# Patient Record
Sex: Female | Born: 1955
Health system: Southern US, Community
[De-identification: ages and names within clinical notes are randomized; demographics above are authoritative.]

## PROBLEM LIST (undated history)

## (undated) DIAGNOSIS — E079 Disorder of thyroid, unspecified: Secondary | ICD-10-CM

## (undated) DIAGNOSIS — D573 Sickle-cell trait: Secondary | ICD-10-CM

## (undated) DIAGNOSIS — R42 Dizziness and giddiness: Secondary | ICD-10-CM

## (undated) DIAGNOSIS — R519 Headache, unspecified: Secondary | ICD-10-CM

## (undated) DIAGNOSIS — I2510 Atherosclerotic heart disease of native coronary artery without angina pectoris: Secondary | ICD-10-CM

## (undated) DIAGNOSIS — I729 Aneurysm of unspecified site: Secondary | ICD-10-CM

## (undated) DIAGNOSIS — E559 Vitamin D deficiency, unspecified: Secondary | ICD-10-CM

## (undated) DIAGNOSIS — G44229 Chronic tension-type headache, not intractable: Secondary | ICD-10-CM

## (undated) DIAGNOSIS — R51 Headache: Secondary | ICD-10-CM

## (undated) DIAGNOSIS — E785 Hyperlipidemia, unspecified: Secondary | ICD-10-CM

## (undated) HISTORY — DX: Sickle-cell trait: D57.3

## (undated) HISTORY — DX: Aneurysm of unspecified site: I72.9

## (undated) HISTORY — DX: Hyperlipidemia, unspecified: E78.5

## (undated) HISTORY — DX: Disorder of thyroid, unspecified: E07.9

## (undated) HISTORY — DX: Vitamin D deficiency, unspecified: E55.9

## (undated) HISTORY — DX: Atherosclerotic heart disease of native coronary artery without angina pectoris: I25.10

## (undated) HISTORY — DX: Dizziness and giddiness: R42

## (undated) HISTORY — DX: Headache: R51

## (undated) HISTORY — DX: Headache, unspecified: R51.9

## (undated) HISTORY — PX: FOOT SURGERY: SHX648

## (undated) HISTORY — PX: TUBAL LIGATION: SHX77

---
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
## 2012-12-05 ENCOUNTER — Emergency Department (HOSPITAL_BASED_OUTPATIENT_CLINIC_OR_DEPARTMENT_OTHER): Payer: Self-pay

## 2012-12-05 ENCOUNTER — Encounter (HOSPITAL_BASED_OUTPATIENT_CLINIC_OR_DEPARTMENT_OTHER): Payer: Self-pay | Admitting: *Deleted

## 2012-12-05 ENCOUNTER — Emergency Department (HOSPITAL_BASED_OUTPATIENT_CLINIC_OR_DEPARTMENT_OTHER)
Admission: EM | Admit: 2012-12-05 | Discharge: 2012-12-05 | Disposition: A | Discharge status: Home / Self Care | Payer: Self-pay | Attending: Emergency Medicine | Admitting: Emergency Medicine

## 2012-12-05 DIAGNOSIS — R071 Chest pain on breathing: Secondary | ICD-10-CM | POA: Insufficient documentation

## 2012-12-05 DIAGNOSIS — E1169 Type 2 diabetes mellitus with other specified complication: Secondary | ICD-10-CM | POA: Insufficient documentation

## 2012-12-05 DIAGNOSIS — R0789 Other chest pain: Secondary | ICD-10-CM

## 2012-12-05 DIAGNOSIS — Z780 Asymptomatic menopausal state: Secondary | ICD-10-CM | POA: Insufficient documentation

## 2012-12-05 DIAGNOSIS — F17200 Nicotine dependence, unspecified, uncomplicated: Secondary | ICD-10-CM | POA: Insufficient documentation

## 2012-12-05 DIAGNOSIS — R0602 Shortness of breath: Secondary | ICD-10-CM | POA: Insufficient documentation

## 2012-12-05 LAB — CBC WITH DIFFERENTIAL/PLATELET
Eosinophils Absolute: 0.2 10*3/uL (ref 0.0–0.7)
Eosinophils Relative: 2 % (ref 0–5)
Hemoglobin: 12.7 g/dL (ref 12.0–15.0)
Lymphs Abs: 3.7 10*3/uL (ref 0.7–4.0)
MCH: 29.6 pg (ref 26.0–34.0)
MCHC: 35.2 g/dL (ref 30.0–36.0)
MCV: 84.1 fL (ref 78.0–100.0)
Monocytes Relative: 5 % (ref 3–12)
RBC: 4.29 MIL/uL (ref 3.87–5.11)

## 2012-12-05 LAB — BASIC METABOLIC PANEL
BUN: 17 mg/dL (ref 6–23)
CO2: 26 mEq/L (ref 19–32)
Calcium: 9.6 mg/dL (ref 8.4–10.5)
Glucose, Bld: 128 mg/dL — ABNORMAL HIGH (ref 70–99)
Potassium: 3.5 mEq/L (ref 3.5–5.1)
Sodium: 140 mEq/L (ref 135–145)

## 2012-12-05 MED ORDER — ACETAMINOPHEN 500 MG PO TABS
1000.0000 mg | ORAL_TABLET | Freq: Once | ORAL | Status: AC
  Administered 2012-12-05: 1000 mg via ORAL
  Filled 2012-12-05: qty 2

## 2012-12-05 NOTE — ED Notes (Signed)
Patient transported to X-ray 

## 2012-12-05 NOTE — ED Provider Notes (Signed)
History     CSN: [id number]  Arrival date & time 12/05/12  1752   First MD Initiated Contact with Patient 12/05/12 1800      Chief Complaint  Patient presents with  . Chest Pain    (Consider location/radiation/quality/duration/timing/severity/associated sxs/prior treatment) HPI Complains of right-sided anterior chest pain onset yesterday, gradual nonradiating. Worse with changing position or deep inspiration. Accompanying symptoms include shortness of breath, mild no nausea or sweatiness. Pain does not feel like indigestion though she treated herself with ibuprofen and an antacid, without relief. Denies cough denies fever denies other complaint. Pain is constant, mild at present History reviewed. No pertinent past medical history. Cardiac risk factors mother had open heart surgery in her 18s, smoker Past Surgical History  Procedure Laterality Date  . Tubal ligation    . Foot surgery      No family history on file.  History  Substance Use Topics  . Smoking status: Current Every Day Smoker -- 0.50 packs/day    Types: Cigarettes  . Smokeless tobacco: Not on file  . Alcohol Use: Yes    OB History   Grav Para Term Preterm Abortions TAB SAB Ect Mult Living                  Review of Systems  Constitutional: Negative.   HENT: Negative.   Respiratory: Positive for shortness of breath.   Cardiovascular: Positive for chest pain.  Gastrointestinal: Negative.   Genitourinary:       Postmenopausal  Musculoskeletal: Negative.   Skin: Negative.   Neurological: Negative.   Psychiatric/Behavioral: Negative.   All other systems reviewed and are negative.    Allergies  Review of patient's allergies indicates no known allergies.  Home Medications  No current outpatient prescriptions on file.  BP 120/72  Pulse 98  Temp(Src) 97.7 F (36.5 C) (Oral)  Resp 20  Wt 170 lb (77.111 kg)  SpO2 100%  Physical Exam  Nursing note and vitals reviewed. Constitutional: She  appears well-developed and well-nourished.  HENT:  Head: Normocephalic and atraumatic.  Eyes: Conjunctivae are normal. Pupils are equal, round, and reactive to light.  Neck: Neck supple. No tracheal deviation present. No thyromegaly present.  Cardiovascular: Normal rate, regular rhythm and normal heart sounds.  Exam reveals no friction rub.   No murmur heard. Pulmonary/Chest: Effort normal and breath sounds normal.  Chest wall is exquisitely tender anteriorly, right parasternal area. Pain is reproduced by forcible abduction of right shoulder  Abdominal: Soft. Bowel sounds are normal. She exhibits no distension. There is no tenderness.  Musculoskeletal: Normal range of motion. She exhibits no edema and no tenderness.  Neurological: She is alert. Coordination normal.  Skin: Skin is warm and dry. No rash noted.  Psychiatric: She has a normal mood and affect.    Date: 12/05/2012  Rate: 90  Rhythm: normal sinus rhythm  QRS Axis: left  Intervals: normal  ST/T Wave abnormalities: normal  Conduction Disutrbances:none  Narrative Interpretation:   Old EKG Reviewed: none available  ED Course  Procedures (including critical care time)  Labs Reviewed - No data to display No results found.  7:15 PM feels improved after treatment with Tylenol. No diagnosis found. Results for orders placed during the hospital encounter of 12/05/12  TROPONIN I      Result Value Range   Troponin I <0.30  <0.30 ng/mL  BASIC METABOLIC PANEL      Result Value Range   Sodium 140  135 - 145 mEq/L  Potassium 3.5  3.5 - 5.1 mEq/L   Chloride 103  96 - 112 mEq/L   CO2 26  19 - 32 mEq/L   Glucose, Bld 128 (*) 70 - 99 mg/dL   BUN 17  6 - 23 mg/dL   Creatinine, Ser 4.09  0.50 - 1.10 mg/dL   Calcium 9.6  8.4 - 81.1 mg/dL   GFR calc non Af Amer 70 (*) >90 mL/min   GFR calc Af Amer 81 (*) >90 mL/min  CBC WITH DIFFERENTIAL      Result Value Range   WBC 7.8  4.0 - 10.5 K/uL   RBC 4.29  3.87 - 5.11 MIL/uL    Hemoglobin 12.7  12.0 - 15.0 g/dL   HCT 91.4  78.2 - 95.6 %   MCV 84.1  78.0 - 100.0 fL   MCH 29.6  26.0 - 34.0 pg   MCHC 35.2  30.0 - 36.0 g/dL   RDW 21.3  08.6 - 57.8 %   Platelets 170  150 - 400 K/uL   Neutrophils Relative 45  43 - 77 %   Neutro Abs 3.5  1.7 - 7.7 K/uL   Lymphocytes Relative 47 (*) 12 - 46 %   Lymphs Abs 3.7  0.7 - 4.0 K/uL   Monocytes Relative 5  3 - 12 %   Monocytes Absolute 0.4  0.1 - 1.0 K/uL   Eosinophils Relative 2  0 - 5 %   Eosinophils Absolute 0.2  0.0 - 0.7 K/uL   Basophils Relative 0  0 - 1 %   Basophils Absolute 0.0  0.0 - 0.1 K/uL  D-DIMER, QUANTITATIVE      Result Value Range   D-Dimer, Quant 0.39  0.00 - 0.48 ug/mL-FEU   Dg Chest 2 View  12/05/2012  *RADIOLOGY REPORT*  Clinical Data: Chest tightness.  Tobacco use.  CHEST - 2 VIEW  Comparison: None.  Findings: Low lung volumes are present, causing crowding of the pulmonary vasculature.  Cardiac and mediastinal contours appear unremarkable.  Possible calcified AP window lymph node.  Attenuated peripheral pulmonary vasculature is compatible with emphysema/COPD.  The lungs appear otherwise clear.  No pleural effusion noted.  IMPRESSION: 1.  Suspected emphysema.  2.  Low lung volumes.   Original Report Authenticated By: Gaylyn Rong, M.D.      Chest x-ray viewed by me MDM  Assessment strongly doubt acute coronary syndrome patient's history highly atypical. Exam and history suggestive of musculoskeletal pain.  Low pretest probability for pulmonary embolism, negative d-dimer. Discussed patient's chest x-ray with her and counseled patient on smoking cessation for 5 minutes Suggested further check for diabetes by PMD Diagnosis #1 musculoskeletal chest pain #2 hyperglycemia #3 tobacco abuse        Doug Sou, MD 12/05/12 Ernestina [place]

## 2012-12-05 NOTE — ED Notes (Signed)
Chest pain on the right side. Pain started yesterday. Worse with laying down and taking deep breaths.

## 2013-04-30 ENCOUNTER — Encounter (HOSPITAL_BASED_OUTPATIENT_CLINIC_OR_DEPARTMENT_OTHER): Payer: Self-pay

## 2013-04-30 ENCOUNTER — Emergency Department (HOSPITAL_BASED_OUTPATIENT_CLINIC_OR_DEPARTMENT_OTHER)
Admission: EM | Admit: 2013-04-30 | Discharge: 2013-04-30 | Disposition: A | Discharge status: Home / Self Care | Payer: Self-pay | Attending: Emergency Medicine | Admitting: Emergency Medicine

## 2013-04-30 DIAGNOSIS — K0889 Other specified disorders of teeth and supporting structures: Secondary | ICD-10-CM

## 2013-04-30 DIAGNOSIS — K089 Disorder of teeth and supporting structures, unspecified: Secondary | ICD-10-CM | POA: Insufficient documentation

## 2013-04-30 DIAGNOSIS — F17200 Nicotine dependence, unspecified, uncomplicated: Secondary | ICD-10-CM | POA: Insufficient documentation

## 2013-04-30 DIAGNOSIS — Z792 Long term (current) use of antibiotics: Secondary | ICD-10-CM | POA: Insufficient documentation

## 2013-04-30 MED ORDER — OXYCODONE-ACETAMINOPHEN 5-325 MG PO TABS
2.0000 | ORAL_TABLET | Freq: Once | ORAL | Status: AC
  Administered 2013-04-30: 2 via ORAL
  Filled 2013-04-30 (×2): qty 2

## 2013-04-30 MED ORDER — OXYCODONE-ACETAMINOPHEN 5-325 MG PO TABS: 1.0000 | ORAL_TABLET | Freq: Four times a day (QID) | ORAL | Status: DC | PRN

## 2013-04-30 NOTE — ED Notes (Signed)
MD at bedside. 

## 2013-04-30 NOTE — ED Notes (Signed)
Left lower tooth ache x 2 days-was seen at [facility]-was given ibuprofen, amoxil-states no better

## 2013-04-30 NOTE — ED Provider Notes (Signed)
CSN: [id number]     Arrival date & time 04/30/13  2234 History     First MD Initiated Contact with Patient 04/30/13 2331     Chief Complaint  Patient presents with  . Dental Pain   (Consider location/radiation/quality/duration/timing/severity/associated sxs/prior Treatment) HPI Pt presents with c/o dental pain.  She states symptmos began 2 days ago- she has also had some swelling to her left lower jaw.  Pain in left lower tooth.  No fever, no difficulty breathing or swallowing.  Pt was seen at her PMD and started on amoxicillin yesterday also given ibuprofen. She states she is not able to sleep due to the pain.  Pain is worse with chewing and palpation.  There are no other associated systemic symptoms, there are no other alleviating or modifying factors.   History reviewed. No pertinent past medical history. Past Surgical History  Procedure Laterality Date  . Tubal ligation    . Foot surgery     No family history on file. History  Substance Use Topics  . Smoking status: Current Every Day Smoker -- 0.50 packs/day    Types: Cigarettes  . Smokeless tobacco: Not on file  . Alcohol Use: Yes   OB History   Grav Para Term Preterm Abortions TAB SAB Ect Mult Living                 Review of Systems ROS reviewed and all otherwise negative except for mentioned in HPI  Allergies  Review of patient's allergies indicates no known allergies.  Home Medications   Current Outpatient Rx  Name  Route  Sig  Dispense  Refill  . amoxicillin (AMOXIL) 875 MG tablet   Oral   Take 875 mg by mouth 2 (two) times daily.         Marland Kitchen ibuprofen (ADVIL,MOTRIN) 800 MG tablet   Oral   Take 800 mg by mouth every 8 (eight) hours as needed for pain.         Marland Kitchen oxyCODONE-acetaminophen (PERCOCET/ROXICET) 5-325 MG per tablet   Oral   Take 1-2 tablets by mouth every 6 (six) hours as needed for pain.   15 tablet   0    BP 138/83  Pulse 102  Temp(Src) 99 F (37.2 C) (Oral)  Resp 18  Ht 5\' 5"   (1.651 m)  Wt 173 lb (78.472 kg)  BMI 28.79 kg/m2  SpO2 98% Vitals reviewed Physical Exam Physical Examination: General appearance - alert, well appearing, and in no distress Mental status - alert, oriented to person, place, and time Eyes - no conjunctival injection, no scleral icterus Mouth - mucous membranes moist, pharynx normal without lesions, poor dentition, one remaining left lower premolar with significant decay, likely periapical abscess, no swelling under the tongue Neck - supple, no significant adenopathy Chest - clear to auscultation, no wheezes, rales or rhonchi, symmetric air entry Heart - normal rate, regular rhythm, normal S1, S2, no murmurs, rubs, clicks or gallops Skin - normal coloration and turgor, no rashes  ED Course   Procedures (including critical care time)  Labs Reviewed - No data to display No results found. 1. Pain, dental     MDM  Pt presenting with c/o dental pain.  She is currently on amoxicillin x 1 day and ibuprofen is not helpign with pain.  Pt encouraged to arrange for dental appointment.  Given rx for pain medicaion and to continue on amoxicillin.  Discharged with strict return precautions.  Pt agreeable with plan.  Ethelda Chick,  MD 05/01/13 7829

## 2014-04-11 LAB — HM PAP SMEAR: HM Pap smear: NORMAL

## 2014-04-11 LAB — HM MAMMOGRAPHY: HM MAMMO: NORMAL

## 2014-08-21 ENCOUNTER — Encounter (HOSPITAL_BASED_OUTPATIENT_CLINIC_OR_DEPARTMENT_OTHER): Payer: Self-pay

## 2014-08-21 ENCOUNTER — Emergency Department (HOSPITAL_BASED_OUTPATIENT_CLINIC_OR_DEPARTMENT_OTHER): Payer: PRIVATE HEALTH INSURANCE

## 2014-08-21 ENCOUNTER — Emergency Department (HOSPITAL_BASED_OUTPATIENT_CLINIC_OR_DEPARTMENT_OTHER)
Admission: EM | Admit: 2014-08-21 | Discharge: 2014-08-21 | Disposition: A | Discharge status: Home / Self Care | Payer: PRIVATE HEALTH INSURANCE | Attending: Emergency Medicine | Admitting: Emergency Medicine

## 2014-08-21 DIAGNOSIS — R519 Headache, unspecified: Secondary | ICD-10-CM

## 2014-08-21 DIAGNOSIS — Z720 Tobacco use: Secondary | ICD-10-CM | POA: Insufficient documentation

## 2014-08-21 DIAGNOSIS — I951 Orthostatic hypotension: Secondary | ICD-10-CM | POA: Diagnosis not present

## 2014-08-21 DIAGNOSIS — R42 Dizziness and giddiness: Secondary | ICD-10-CM | POA: Insufficient documentation

## 2014-08-21 DIAGNOSIS — R2241 Localized swelling, mass and lump, right lower limb: Secondary | ICD-10-CM | POA: Diagnosis not present

## 2014-08-21 DIAGNOSIS — R51 Headache: Secondary | ICD-10-CM | POA: Diagnosis present

## 2014-08-21 DIAGNOSIS — M7989 Other specified soft tissue disorders: Secondary | ICD-10-CM

## 2014-08-21 LAB — CBC WITH DIFFERENTIAL/PLATELET
Basophils Absolute: 0 10*3/uL (ref 0.0–0.1)
Basophils Relative: 0 % (ref 0–1)
Eosinophils Absolute: 0.1 10*3/uL (ref 0.0–0.7)
Eosinophils Relative: 1 % (ref 0–5)
HCT: 39.4 % (ref 36.0–46.0)
HEMOGLOBIN: 13.6 g/dL (ref 12.0–15.0)
LYMPHS ABS: 2.9 10*3/uL (ref 0.7–4.0)
LYMPHS PCT: 28 % (ref 12–46)
MCH: 29.4 pg (ref 26.0–34.0)
MCHC: 34.5 g/dL (ref 30.0–36.0)
MCV: 85.1 fL (ref 78.0–100.0)
MONOS PCT: 5 % (ref 3–12)
Monocytes Absolute: 0.6 10*3/uL (ref 0.1–1.0)
NEUTROS ABS: 7 10*3/uL (ref 1.7–7.7)
NEUTROS PCT: 66 % (ref 43–77)
PLATELETS: 210 10*3/uL (ref 150–400)
RBC: 4.63 MIL/uL (ref 3.87–5.11)
RDW: 13.5 % (ref 11.5–15.5)
WBC: 10.6 10*3/uL — AB (ref 4.0–10.5)

## 2014-08-21 LAB — URINALYSIS, ROUTINE W REFLEX MICROSCOPIC
BILIRUBIN URINE: NEGATIVE
GLUCOSE, UA: NEGATIVE mg/dL
Ketones, ur: NEGATIVE mg/dL
Nitrite: NEGATIVE
PROTEIN: NEGATIVE mg/dL
SPECIFIC GRAVITY, URINE: 1.009 (ref 1.005–1.030)
UROBILINOGEN UA: 1 mg/dL (ref 0.0–1.0)
pH: 7 (ref 5.0–8.0)

## 2014-08-21 LAB — COMPREHENSIVE METABOLIC PANEL
ALK PHOS: 163 U/L — AB (ref 39–117)
ALT: 14 U/L (ref 0–35)
ANION GAP: 15 (ref 5–15)
AST: 14 U/L (ref 0–37)
Albumin: 3.9 g/dL (ref 3.5–5.2)
BILIRUBIN TOTAL: 0.2 mg/dL — AB (ref 0.3–1.2)
BUN: 11 mg/dL (ref 6–23)
CHLORIDE: 102 meq/L (ref 96–112)
CO2: 26 mEq/L (ref 19–32)
Calcium: 9.7 mg/dL (ref 8.4–10.5)
Creatinine, Ser: 0.8 mg/dL (ref 0.50–1.10)
GFR calc non Af Amer: 80 mL/min — ABNORMAL LOW (ref >90)
GLUCOSE: 99 mg/dL (ref 70–99)
POTASSIUM: 4 meq/L (ref 3.7–5.3)
SODIUM: 143 meq/L (ref 137–147)
TOTAL PROTEIN: 7.9 g/dL (ref 6.0–8.3)

## 2014-08-21 LAB — URINE MICROSCOPIC-ADD ON

## 2014-08-21 LAB — PRO B NATRIURETIC PEPTIDE: PRO B NATRI PEPTIDE: 7.9 pg/mL (ref 0–125)

## 2014-08-21 LAB — TROPONIN I: Troponin I: <0.3 ng/mL (ref <0.30)

## 2014-08-21 MED ORDER — DIPHENHYDRAMINE HCL 50 MG/ML IJ SOLN
25.0000 mg | Freq: Once | INTRAMUSCULAR | Status: AC
  Administered 2014-08-21: 25 mg via INTRAVENOUS
  Filled 2014-08-21: qty 1

## 2014-08-21 MED ORDER — METOCLOPRAMIDE HCL 5 MG/ML IJ SOLN
10.0000 mg | Freq: Once | INTRAMUSCULAR | Status: AC
  Administered 2014-08-21: 10 mg via INTRAVENOUS
  Filled 2014-08-21: qty 2

## 2014-08-21 MED ORDER — KETOROLAC TROMETHAMINE 30 MG/ML IJ SOLN
30.0000 mg | Freq: Once | INTRAMUSCULAR | Status: AC
  Administered 2014-08-21: 30 mg via INTRAVENOUS
  Filled 2014-08-21: qty 1

## 2014-08-21 MED ORDER — SODIUM CHLORIDE 0.9 % IV BOLUS (SEPSIS)
1000.0000 mL | Freq: Once | INTRAVENOUS | Status: AC
  Administered 2014-08-21: 1000 mL via INTRAVENOUS

## 2014-08-21 NOTE — ED Notes (Signed)
MD at bedside. 

## 2014-08-21 NOTE — Discharge Instructions (Signed)
General Headache Without Cause Follow up with your doctor. Return to the ED if you develop new or worsening symptoms. A headache is pain or discomfort felt around the head or neck area. The specific cause of a headache may not be found. There are many causes and types of headaches. A few common ones are:  Tension headaches.  Migraine headaches.  Cluster headaches.  Chronic daily headaches. HOME CARE INSTRUCTIONS   Keep all follow-up appointments with your caregiver or any specialist referral.  Only take over-the-counter or prescription medicines for pain or discomfort as directed by your caregiver.  Lie down in a dark, quiet room when you have a headache.  Keep a headache journal to find out what may trigger your migraine headaches. For example, write down:  What you eat and drink.  How much sleep you get.  Any change to your diet or medicines.  Try massage or other relaxation techniques.  Put ice packs or heat on the head and neck. Use these 3 to 4 times per day for 15 to 20 minutes each time, or as needed.  Limit stress.  Sit up straight, and do not tense your muscles.  Quit smoking if you smoke.  Limit alcohol use.  Decrease the amount of caffeine you drink, or stop drinking caffeine.  Eat and sleep on a regular schedule.  Get 7 to 9 hours of sleep, or as recommended by your caregiver.  Keep lights dim if bright lights bother you and make your headaches worse. SEEK MEDICAL CARE IF:   You have problems with the medicines you were prescribed.  Your medicines are not working.  You have a change from the usual headache.  You have nausea or vomiting. SEEK IMMEDIATE MEDICAL CARE IF:   Your headache becomes severe.  You have a fever.  You have a stiff neck.  You have loss of vision.  You have muscular weakness or loss of muscle control.  You start losing your balance or have trouble walking.  You feel faint or pass out.  You have severe symptoms that  are different from your first symptoms. MAKE SURE YOU:   Understand these instructions.  Will watch your condition.  Will get help right away if you are not doing well or get worse. Document Released: 08/28/2005 Document Revised: 11/20/2011 Document Reviewed: 09/13/2011 [facility] Patient Information 2015 [place], [place]. This information is not intended to replace advice given to you by your health care provider. Make sure you discuss any questions you have with your health care provider.

## 2014-08-21 NOTE — ED Notes (Signed)
Pt able to keep fluids down.  

## 2014-08-21 NOTE — ED Provider Notes (Signed)
CSN: [id number]     Arrival date & time 08/21/14  1811 History  This chart was scribed for Ezequiel Essex, MD by Randa Evens, ED Scribe. This patient was seen in room MH11/MH11 and the patient's care was started at 6:24 PM.    Chief Complaint  Patient presents with  . Headache   Patient is a 58 y.o. female presenting with headaches. The history is provided by the patient. No language interpreter was used.  Headache Associated symptoms: dizziness   Associated symptoms: no abdominal pain    HPI Comments: Jaime Harris is a 58 y.o. female who presents to the Emergency Department complaining of gradual headache onset today. She states that this headache feels similar to others but is slightly worse today.  She states she has associated dizziness. She states she is having complication with her vision but was unable to specify and it is now back to normal. She is complain of leg swelling as well for about 2 weeks. She states that right leg is worse than the left. She states she has taken ibuprofen for the headache with no relief. Denies CP, SOB, abdominal pain or nausea. Pt denies Hx of cardiac problems or DVT's. Pt states she has had a CT scan and MRI previously for her headaches and dizziness but states that the readings showed no abnormalities.    History reviewed. No pertinent past medical history. Past Surgical History  Procedure Laterality Date  . Tubal ligation    . Foot surgery     No family history on file. History  Substance Use Topics  . Smoking status: Current Every Day Smoker -- 0.50 packs/day    Types: Cigarettes  . Smokeless tobacco: Not on file  . Alcohol Use: Yes   OB History    No data available     Review of Systems  Respiratory: Negative for shortness of breath.   Cardiovascular: Positive for leg swelling. Negative for chest pain.  Gastrointestinal: Negative for abdominal pain.  Neurological: Positive for dizziness and headaches.   A complete 10 system  review of systems was obtained and all systems are negative except as noted in the HPI and PMH.     Allergies  Review of patient's allergies indicates no known allergies.  Home Medications   Prior to Admission medications   Not on File   Triage Vitals: BP 135/82 mmHg  Pulse 108  Temp(Src) 98.7 F (37.1 C) (Oral)  Resp 18  Ht 5\' 5"  (1.651 m)  Wt 180 lb (81.647 kg)  BMI 29.95 kg/m2  SpO2 99%    Physical Exam  Constitutional: She is oriented to person, place, and time. She appears well-developed and well-nourished. No distress.  HENT:  Head: Normocephalic and atraumatic.  Mouth/Throat: Oropharynx is clear and moist. No oropharyngeal exudate.  No temporal artery tenderness  Eyes: Conjunctivae and EOM are normal. Pupils are equal, round, and reactive to light.  Neck: Normal range of motion. Neck supple.  No meningismus.  Cardiovascular: Normal rate, regular rhythm, normal heart sounds and intact distal pulses.   No murmur heard. Pulmonary/Chest: Effort normal and breath sounds normal. No respiratory distress.  Abdominal: Soft. There is no tenderness. There is no rebound and no guarding.  Musculoskeletal: Normal range of motion. She exhibits edema. She exhibits no tenderness.  Trace pedal edema bilaterally, no calf tenderness.   Neurological: She is alert and oriented to person, place, and time. No cranial nerve deficit. She exhibits normal muscle tone. Coordination normal.  No ataxia on  finger to nose bilaterally. No pronator drift. 5/5 strength throughout. CN 2-12 intact. Negative Romberg. Equal grip strength. Sensation intact. Gait is normal.  Test of skew negative. Head impulse testing negative.  Skin: Skin is warm.  Psychiatric: She has a normal mood and affect. Her behavior is normal.  Nursing note and vitals reviewed.   ED Course  Procedures (including critical care time) DIAGNOSTIC STUDIES: Oxygen Saturation is 99% on RA, normal by my interpretation.     COORDINATION OF CARE: 6:34 PM-Discussed treatment plan with pt at bedside and pt agreed to plan.     Labs Review Labs Reviewed  CBC WITH DIFFERENTIAL - Abnormal; Notable for the following:    WBC 10.6 (*)    All other components within normal limits  COMPREHENSIVE METABOLIC PANEL - Abnormal; Notable for the following:    Alkaline Phosphatase 163 (*)    Total Bilirubin 0.2 (*)    GFR calc non Af Amer 80 (*)    All other components within normal limits  URINALYSIS, ROUTINE W REFLEX MICROSCOPIC - Abnormal; Notable for the following:    APPearance CLOUDY (*)    Hgb urine dipstick SMALL (*)    Leukocytes, UA TRACE (*)    All other components within normal limits  URINE MICROSCOPIC-ADD ON - Abnormal; Notable for the following:    Squamous Epithelial / LPF MANY (*)    Bacteria, UA MANY (*)    All other components within normal limits  TROPONIN I  PRO B NATRIURETIC PEPTIDE    Imaging Review Ct Head Wo Contrast  08/21/2014   CLINICAL DATA:  Headaches. Dizziness. Blurred vision. Leg swelling.  EXAM: CT HEAD WITHOUT CONTRAST  TECHNIQUE: Contiguous axial images were obtained from the base of the skull through the vertex without intravenous contrast.  COMPARISON:  None.  FINDINGS: The brainstem, cerebellum, cerebral peduncles, thalamus, basal ganglia, basilar cisterns, and ventricular system appear within normal limits. No intracranial hemorrhage, mass lesion, or acute CVA. Visualized paranasal sinuses appear clear.  IMPRESSION: No significant abnormality identified.   Electronically Signed   By: Sherryl Barters M.D.   On: 08/21/2014 18:54   US Venous Img Lower Bilateral  08/21/2014   CLINICAL DATA:  Bilateral lower extremity swelling chronically over the lower legs worse over the past 2 weeks. No pain.  EXAM: BILATERAL LOWER EXTREMITY VENOUS DOPPLER ULTRASOUND  TECHNIQUE: Gray-scale sonography with graded compression, as well as color Doppler and duplex ultrasound were performed to  evaluate the lower extremity deep venous systems from the level of the common femoral vein and including the common femoral, femoral, profunda femoral, popliteal and calf veins including the posterior tibial, peroneal and gastrocnemius veins when visible. The superficial great saphenous vein was also interrogated. Spectral Doppler was utilized to evaluate flow at rest and with distal augmentation maneuvers in the common femoral, femoral and popliteal veins.  COMPARISON:  None.  FINDINGS: RIGHT LOWER EXTREMITY  Common Femoral Vein: No evidence of thrombus. Normal compressibility, respiratory phasicity and response to augmentation.  Saphenofemoral Junction: No evidence of thrombus. Normal compressibility and flow on color Doppler imaging.  Profunda Femoral Vein: No evidence of thrombus. Normal compressibility and flow on color Doppler imaging.  Femoral Vein: No evidence of thrombus. Normal compressibility, respiratory phasicity and response to augmentation.  Popliteal Vein: No evidence of thrombus. Normal compressibility, respiratory phasicity and response to augmentation.  Calf Veins: No evidence of thrombus. Normal compressibility and flow on color Doppler imaging.  Superficial Great Saphenous Vein: No evidence of thrombus. Normal  compressibility and flow on color Doppler imaging.  Venous Reflux:  None.  Other Findings:  None.  LEFT LOWER EXTREMITY  Common Femoral Vein: No evidence of thrombus. Normal compressibility, respiratory phasicity and response to augmentation.  Saphenofemoral Junction: No evidence of thrombus. Normal compressibility and flow on color Doppler imaging.  Profunda Femoral Vein: No evidence of thrombus. Normal compressibility and flow on color Doppler imaging.  Femoral Vein: No evidence of thrombus. Normal compressibility, respiratory phasicity and response to augmentation.  Popliteal Vein: No evidence of thrombus. Normal compressibility, respiratory phasicity and response to augmentation.  Calf  Veins: No evidence of thrombus. Normal compressibility and flow on color Doppler imaging.  Superficial Great Saphenous Vein: No evidence of thrombus. Normal compressibility and flow on color Doppler imaging.  Venous Reflux:  None.  Other Findings: 1.7 cm minimally complicated cystic structure over the left popliteal fossa likely Baker cyst.  IMPRESSION: No evidence of deep venous thrombosis.  1.7 cm minimally complicated cyst over the left popliteal fossa likely a Baker's cyst.   Electronically Signed   By: Marin Olp M.D.   On: 08/21/2014 19:44     EKG Interpretation   Date/Time:  Friday August 21 2014 18:43:00 EST Ventricular Rate:  98 PR Interval:  158 QRS Duration: 90 QT Interval:  342 QTC Calculation: 436 R Axis:   -29 Text Interpretation:  Normal sinus rhythm Minimal voltage criteria for  LVH, may be normal variant Borderline ECG No significant change was found  Confirmed by [place] [phone]) on 08/21/2014 6:48:43 PM      MDM   Final diagnoses:  Headache  Leg swelling   Patient presents with gradual onset headache associated with lightheadedness similar to previous. States she's had multiple CTs and MRIs without diagnosis. These results are not available. Her neurological exam is nonfocal.  CT head negative. Headache improved with migraine cocktail of Toradol, Reglan and Benadryl. Orthostatics positive by HR.  Elevates to 105 with standing. UA appears contaminated.  Dopplers negative for DVTs. Patient is ambulatory and tolerating by mouth.  Symptoms improved with PO and IV fluids.  nonfocal neuro exam.  Headache resolved.  Appears stable for outpatient followup. Doubt acute CVA.  BP 102/51 mmHg  Pulse 92  Temp(Src) 98.7 F (37.1 C) (Oral)  Resp 18  Ht 5\' 5"  (1.651 m)  Wt 180 lb (81.647 kg)  BMI 29.95 kg/m2  SpO2 98%    I personally performed the services described in this documentation, which was scribed in my presence. The recorded information  has been reviewed and is accurate.    Ezequiel Essex, MD 08/22/14 [phone]

## 2014-08-21 NOTE — ED Notes (Signed)
Ambulatory without diff

## 2014-08-21 NOTE — ED Notes (Addendum)
C/o HA, dizziness "for a year"-swelling to bilat LEs x "couple weeks"-states she came to ED today b/c left LE "is cold to touch"-steady gait to tx area

## 2014-09-11 HISTORY — PX: COLONOSCOPY: SHX174

## 2014-10-30 ENCOUNTER — Ambulatory Visit: Payer: PRIVATE HEALTH INSURANCE | Admitting: Family

## 2014-11-13 ENCOUNTER — Telehealth: Payer: Self-pay | Admitting: *Deleted

## 2014-11-13 ENCOUNTER — Encounter: Payer: Self-pay | Admitting: *Deleted

## 2014-11-13 NOTE — Telephone Encounter (Signed)
Pre-Visit Call completed with patient and chart updated.   Pre-Visit Info documented in Specialty Comments under [employer].    

## 2014-11-16 ENCOUNTER — Encounter: Payer: Self-pay | Admitting: Family

## 2014-11-16 ENCOUNTER — Ambulatory Visit (INDEPENDENT_AMBULATORY_CARE_PROVIDER_SITE_OTHER): Payer: PRIVATE HEALTH INSURANCE | Admitting: Family

## 2014-11-16 VITALS — BP 126/80 | HR 107 | Temp 98.0°F | Resp 18 | Ht 64.5 in | Wt 175.0 lb

## 2014-11-16 DIAGNOSIS — E049 Nontoxic goiter, unspecified: Secondary | ICD-10-CM

## 2014-11-16 DIAGNOSIS — H8110 Benign paroxysmal vertigo, unspecified ear: Secondary | ICD-10-CM | POA: Insufficient documentation

## 2014-11-16 DIAGNOSIS — G44209 Tension-type headache, unspecified, not intractable: Secondary | ICD-10-CM

## 2014-11-16 DIAGNOSIS — E010 Iodine-deficiency related diffuse (endemic) goiter: Secondary | ICD-10-CM | POA: Insufficient documentation

## 2014-11-16 DIAGNOSIS — K589 Irritable bowel syndrome without diarrhea: Secondary | ICD-10-CM

## 2014-11-16 DIAGNOSIS — E039 Hypothyroidism, unspecified: Secondary | ICD-10-CM

## 2014-11-16 LAB — TSH: TSH: 0.44 u[IU]/mL (ref 0.35–4.50)

## 2014-11-16 NOTE — Assessment & Plan Note (Signed)
Obtain tsh and thyroid ultrasound.

## 2014-11-16 NOTE — Patient Instructions (Signed)
Please complete lab work prior to leaving. You will be contacted about your thyroid ultrasound. Please schedule a complete physical at the front desk.

## 2014-11-16 NOTE — Assessment & Plan Note (Signed)
Orthostatics are negative.  Will refer for vestibular rehab.

## 2014-11-16 NOTE — Progress Notes (Signed)
Subjective:    Patient ID: Jaime Harris, female    DOB: 11-19-1955, 59 y.o.   MRN: [id number]  HPI  Jaime Harris is a 59 yr old female who presents today to establish care.    She has chief complaint of headache and dizziness. She was followed at [facility].  Symptoms started >1 yr ago and are worsened.  Reports that she had MRI an CT scan. Has seen an "ear specialist" at [place]. Reports that she was given meclizine which does not help.  Headache- report that headache is intermittent- about 3 times a week.  Last 1 hour.  Usually lasts 1 hour, sometimes improved by ibuprofen.  She reports that she had nausea this AM, but typically no nausea with HA's. Denies photophobia or phonophobia. When she has her HA it is behind her eyes. She denies hx of allergies. She had eye exam last night and was told her prescription was better.  Dizziness, worse with standing.  She sits at work.  When she stands up she needs to stop before she continues to move.  Denies numbness/weakness, denies visual changes.    She reports that she has hx of enlarged thyroid. He reports that in the past she has had thyroid US's but it has been a long time. Thinks that her TSH has been stable but not sure.   Review of Systems  Constitutional: Negative for unexpected weight change.  HENT: Negative for hearing loss.        Mild drainage- resolving  Eyes: Negative for visual disturbance.  Respiratory: Negative for cough.   Cardiovascular: Negative for leg swelling.  Gastrointestinal: Negative for nausea and diarrhea.       IBS- used amitiza, which helped constipation but too expensive  Genitourinary: Negative for dysuria and frequency.  Musculoskeletal: Negative for myalgias and arthralgias.  Skin: Negative for rash.  Neurological: Positive for dizziness and headaches.  Hematological: Negative for adenopathy.  Psychiatric/Behavioral: Negative for dysphoric mood and agitation.       Past Medical History    Diagnosis Date  . Vertigo   . Thyroid disease     enlarged thyroid    History   Social History  . Marital Status: Single    Spouse Name: N/A  . Number of Children: N/A  . Years of Education: N/A   Occupational History  . Not on file.   Social History Main Topics  . Smoking status: Current Every Day Smoker -- 0.50 packs/day    Types: Cigarettes  . Smokeless tobacco: Not on file  . Alcohol Use: 0.0 oz/week    0 Standard drinks or equivalent per week     Comment: occasional  . Drug Use: No  . Sexual Activity: Not on file   Other Topics Concern  . Not on file   Social History Narrative   Makes eye glasses.     Single,   GED   She has 3 children   63- Son Jaime Harris- lives locally- he has one son   71- Daughter Jaime Harris- 7 children (4 of these children live with the pt- she is a primary care giver)   Jaime Harris- lives in [place] 1 daughter   Enjoys reading    Past Surgical History  Procedure Laterality Date  . Tubal ligation    . Foot surgery Bilateral     Pt states she had bones removed from the sides of her feet and middle toes so she could walk better.  . Tonsillectomy  1975    Family History  Problem Relation Age of Onset  . Heart disease Mother     pacemaker  . Diabetes Mother   . Heart disease Paternal Grandmother     died of CHF  . Diabetes Paternal Grandmother   . Cancer Neg Hx   . Kidney disease Neg Hx     No Known Allergies  No current outpatient prescriptions on file prior to visit.   No current facility-administered medications on file prior to visit.    BP 126/80 mmHg  Pulse 107  Temp(Src) 98 F (36.7 C) (Oral)  Resp 18  Ht 5' 4.5" (1.638 m)  Wt 175 lb (79.379 kg)  BMI 29.59 kg/m2  SpO2 99%    Objective:   Physical Exam  Constitutional: She is oriented to person, place, and time. She appears well-developed and well-nourished.  HENT:  Head: Normocephalic and atraumatic.  Right Ear: Tympanic membrane and ear canal normal.   Left Ear: Tympanic membrane and ear canal normal.  Mouth/Throat: No oropharyngeal exudate or posterior oropharyngeal edema.  Eyes: Pupils are equal, round, and reactive to light.  Neck: Thyromegaly present.  Cardiovascular: Normal rate, regular rhythm and normal heart sounds.   No murmur heard. Pulmonary/Chest: Effort normal and breath sounds normal. No respiratory distress. She has no wheezes.  Musculoskeletal: She exhibits no edema.  Neurological: She is alert and oriented to person, place, and time. Coordination normal.  Skin: Skin is warm and dry.  Psychiatric: She has a normal mood and affect. Her behavior is normal. Judgment and thought content normal.          Assessment & Plan:

## 2014-11-16 NOTE — Progress Notes (Signed)
Pre visit review using our clinic review tool, if applicable. No additional management support is needed unless otherwise documented below in the visit note. 

## 2014-11-16 NOTE — Assessment & Plan Note (Signed)
We discussed referral to neurology/HA specialist, but she declines at this time and will let me know if her symptoms worsen.

## 2014-11-17 ENCOUNTER — Telehealth: Payer: Self-pay | Admitting: Family

## 2014-11-17 NOTE — Telephone Encounter (Signed)
emmi emailed °

## 2014-11-18 ENCOUNTER — Ambulatory Visit (HOSPITAL_BASED_OUTPATIENT_CLINIC_OR_DEPARTMENT_OTHER): Payer: PRIVATE HEALTH INSURANCE

## 2014-11-19 ENCOUNTER — Ambulatory Visit (HOSPITAL_BASED_OUTPATIENT_CLINIC_OR_DEPARTMENT_OTHER): Payer: PRIVATE HEALTH INSURANCE

## 2014-11-22 ENCOUNTER — Ambulatory Visit (HOSPITAL_BASED_OUTPATIENT_CLINIC_OR_DEPARTMENT_OTHER)
Admission: RE | Admit: 2014-11-22 | Discharge: 2014-11-22 | Disposition: A | Discharge status: Home / Self Care | Payer: PRIVATE HEALTH INSURANCE | Source: Ambulatory Visit | Attending: Family | Admitting: Family

## 2014-11-22 DIAGNOSIS — E049 Nontoxic goiter, unspecified: Secondary | ICD-10-CM | POA: Insufficient documentation

## 2014-11-22 DIAGNOSIS — R42 Dizziness and giddiness: Secondary | ICD-10-CM | POA: Insufficient documentation

## 2014-11-24 ENCOUNTER — Ambulatory Visit: Payer: PRIVATE HEALTH INSURANCE | Attending: Family | Admitting: Physical Therapy

## 2014-11-24 DIAGNOSIS — R42 Dizziness and giddiness: Secondary | ICD-10-CM | POA: Insufficient documentation

## 2014-11-24 DIAGNOSIS — H8111 Benign paroxysmal vertigo, right ear: Secondary | ICD-10-CM | POA: Insufficient documentation

## 2014-11-24 NOTE — Therapy (Signed)
[facility] [address]  [place], [place], [address] Phone: [phone]   Fax:  [phone]  Physical Therapy Evaluation  Patient Details  Name: Jaime Harris MRN: [id number] Date of Birth: August 12, 1956 Referring Provider:  Debbrah Alar, NP  Encounter Date: 11/24/2014      PT End of Session - 11/24/14 1228    Visit Number 1   Number of Visits 6   Date for PT Re-Evaluation 01/05/15   PT Start Time 1105   PT Stop Time 1140   PT Time Calculation (min) 35 min   Activity Tolerance Patient tolerated treatment well   Behavior During Therapy [facility] for tasks assessed/performed      Past Medical History  Diagnosis Date  . Vertigo   . Thyroid disease     enlarged thyroid  . Sickle cell trait     Past Surgical History  Procedure Laterality Date  . Tubal ligation    . Foot surgery Bilateral     Pt states she had bones removed from the sides of her feet and middle toes so she could walk better.  . Tonsillectomy  1975    There were no vitals filed for this visit.  Visit Diagnosis:  Benign paroxysmal positional vertigo, right - Plan: PT plan of care cert/re-cert  Dizziness and giddiness - Plan: PT plan of care cert/re-cert      Subjective Assessment - 11/24/14 1107    Symptoms Pt is a 59 y/o female who presents to OPPT for constant vertigo.  Pt report symptoms have progressed from "a few times a year" to constant.  Symptoms provoked with sitting to standing and looking up. When symptoms exacerbated reports lasting about 30 seconds.   Pertinent History sickle cell   Limitations Walking   Patient Stated Goals to eliminate dizziness   Currently in Pain? No/denies            [facility] PT Assessment - 11/24/14 1109    Assessment   Medical Diagnosis vertigo   Onset Date --  2 years ago; exacerbated over past 6 months   Next MD Visit April 2016   Prior Therapy none   Precautions   Precautions None   Restrictions   Weight Bearing Restrictions No   Balance Screen   Has the patient fallen in the past 6 months No   Has the patient had a decrease in activity level because of a fear of falling?  No   Is the patient reluctant to leave their home because of a fear of falling?  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   Leisure grandchildren 32-16 y/o, bowling, fishing   Cognition   Overall Cognitive Status Within Functional Limits for tasks assessed            Vestibular Assessment - 11/24/14 1114    Symptom Behavior   Type of Dizziness Imbalance  blurry vision; "on the ocean"   Frequency of Dizziness  constant with exacerbation provoked by standing   Duration of Dizziness constant; increase in symptoms lasting about 30 sec   Aggravating Factors Sit to stand;Looking up to the ceiling   Relieving Factors Rest;Head stationary   Occulomotor Exam   Occulomotor Alignment Normal   Smooth Pursuits Intact   Saccades Intact   Comment head thrust: bil negative; increase in symptoms on L   Vestibulo-Occular Reflex   VOR 1 Head Only (x 1 viewing) WNL   Positional Testing   Dix-Hallpike Dix-Hallpike Right;Dix-Hallpike Left   Horizontal Canal Testing --   Dix-Hallpike Right   Dix-Hallpike Right Duration > 1 min   Dix-Hallpike Right Symptoms Upbeat, right rotatory nystagmus   Dix-Hallpike Left   Dix-Hallpike Left Duration on going   Dix-Hallpike Left Symptoms No  nystagmus;Other (comment)  c/o blurry vision and increased ear pressure                Vestibular Treatment/Exercise - 11/24/14 1227    Vestibular Treatment/Exercise   Vestibular Treatment Provided Canalith Repositioning   Canalith Repositioning Epley Manuever Right    EPLEY MANUEVER RIGHT   Number of Reps  1   Overall Response Improved Symptoms   Response Details  performed head shaking prior to epley's maneuver due to concern for cupulolithiasis; pt asymptomatic following repositioning               PT Education - 11/24/14 1228    Education provided Yes   Education Details BPPV   Person(s) Educated Patient   Methods Explanation;Handout   Comprehension Verbalized understanding             PT Long Term Goals - 11/24/14 1232    PT LONG TERM GOAL #1   Title independent with HEP (01/05/15)   Time 6   Period Weeks   Status New   PT LONG TERM GOAL #2   Title demonstrate negative positional testing (01/05/15)   Time 6   Period Weeks   Status New   PT LONG TERM GOAL #3   Title report 75% improvement in symptoms (01/05/15)   Time 6   Period Weeks   Status New               Plan - 11/24/14 1229    Clinical Impression Statement Pt presents to OPPT with 2 year history of vertigo.  Testing indicated possible R post canal cupulolithiasis treated with 1 rep of epleys today with complete resolve in symptoms.  Will plan to follow up and address as needed.  Pt may have underlying vestibular hypofunction as well due to duration of symptoms.   Pt will benefit from skilled therapeutic intervention in order to improve on the following deficits Decreased balance;Other (comment)  dizziness; vertigo   Rehab Potential Good   PT Frequency 1x / week   PT Duration 6 weeks   PT Treatment/Interventions ADLs/Self Care Home Management;Neuromuscular re-education;Other (comment);Functional mobility training;Patient/family education;Therapeutic activities;Balance  training;Therapeutic exercise  canalith repositioning   PT Next Visit Plan reassess; check horizontal canal   Consulted and Agree with Plan of Care Patient         Problem List Patient Active Problem List   Diagnosis Date Noted  . Thyromegaly 11/16/2014  . BPV (benign positional vertigo) 11/16/2014  . Tension headache 11/16/2014  . IBS (irritable bowel syndrome) 11/16/2014   Laureen Abrahams, PT, DPT 11/24/2014 12:38 PM  [facility] [address]  [place], [place], [address] Phone:  [phone]   Fax:  [phone]

## 2014-11-30 ENCOUNTER — Ambulatory Visit: Payer: PRIVATE HEALTH INSURANCE | Admitting: Physical Therapy

## 2014-11-30 DIAGNOSIS — H8111 Benign paroxysmal vertigo, right ear: Secondary | ICD-10-CM

## 2014-11-30 DIAGNOSIS — R42 Dizziness and giddiness: Secondary | ICD-10-CM

## 2014-11-30 NOTE — Patient Instructions (Signed)
Gaze Stabilization: Sitting   Keeping eyes on target on wall 3-5 feet away, move head side to side for __30__ seconds. Repeat while moving head up and down for _30___ seconds. Do __2-3__ sessions per day. Repeat using target on pattern background.  Copyright  VHI. All rights reserved.    Special Instructions: Exercises may bring on mild to moderate symptoms of dizziness that resolve within 30 minutes of completing exercises. If symptoms are lasting longer than 30 minutes, modify your exercises by:  >decreasing the # of times you complete each activity >ensuring your symptoms return to baseline before moving onto the next exercise >dividing up exercises so you do not do them all in one session, but multiple short sessions throughout the day >doing them once a day until symptoms improve   Laureen Abrahams, PT, DPT 11/30/2014 11:28 AM  [facility] [place], [place] [address]  [phone] (office) [phone] (fax)

## 2014-11-30 NOTE — Therapy (Signed)
[facility] [address]  [place], [place], [address] Phone: [phone]   Fax:  [phone]  Physical Therapy Treatment  Patient Details  Name: Jaime Harris MRN: [id number] Date of Birth: November 12, 1955 Referring Provider:  Debbrah Alar, NP  Encounter Date: 11/30/2014      PT End of Session - 11/30/14 1147    Visit Number 2   Number of Visits 6   Date for PT Re-Evaluation 01/05/15   PT Start Time 1100   PT Stop Time 1139   PT Time Calculation (min) 39 min   Activity Tolerance Patient tolerated treatment well   Behavior During Therapy [facility] for tasks assessed/performed      Past Medical History  Diagnosis Date  . Vertigo   . Thyroid disease     enlarged thyroid  . Sickle cell trait     Past Surgical History  Procedure Laterality Date  . Tubal ligation    . Foot surgery Bilateral     Pt states she had bones removed from the sides of her feet and middle toes so she could walk better.  . Tonsillectomy  1975    There were no vitals filed for this visit.  Visit Diagnosis:  Benign paroxysmal positional vertigo, right  Dizziness and giddiness      Subjective Assessment - 11/30/14 1102    Symptoms Dizziness resolved x 2 days then returned.  Had episode getting onto elevator "like I was swaying" and subsided when returning to clinic.  Over weekend it was "awful."   Pertinent History sickle cell   Limitations Walking   Patient Stated Goals to eliminate dizziness   Currently in Pain? No/denies                Vestibular Assessment - 11/30/14 1104    Positional Testing   Dix-Hallpike Dix-Hallpike Right;Dix-Hallpike Left   Horizontal Canal Testing Horizontal Canal Right;Horizontal Canal Left   Dix-Hallpike Right   Dix-Hallpike Right Symptoms No nystagmus;Other (comment)  "woozy"   Dix-Hallpike Left   Dix-Hallpike Left Symptoms No nystagmus  "woozy"   Horizontal Canal Right   Horizontal  Canal Right Symptoms Normal   Horizontal Canal Left   Horizontal Canal Left Symptoms Normal   Orthostatics   BP supine (x 5 minutes) 143/95 mmHg   HR supine (x 5 minutes) 80   BP standing (after 1 minute) 150/92 mmHg   HR standing (after 1 minute) 95   BP standing (after 3 minutes) 150/95 mmHg   HR standing (after 3 minutes) 91   Orthostatics Comment positive symptoms with postion changes                Vestibular Treatment/Exercise - 11/30/14 1144    Vestibular Treatment/Exercise   Gaze Exercises X1 Viewing Horizontal;X1 Viewing Vertical   X1 Viewing Horizontal   Foot Position sitting   Time --  30 sec   Reps 1   X1 Viewing Vertical   Foot Position sitting   Time --  30 sec   Reps 1            Balance Exercises - 11/30/14 1145    Balance Exercises: Standing   Standing Eyes Closed Narrow base of support (BOS);Foam/compliant surface;Solid surface;3 reps;10 secs;Head turns;Limitations   Balance Exercises: Standing   Standing Eyes Closed Limitations Feet together with EC on solid surface: 3x10 sec hold; horizontal/vertical head turns x 10; on compliant surface feet together 3 x 10 sec with increased sway  PT Education - 11/30/14 1143    Education provided Yes   Education Details orthostatics and BP (pt negative but with elevated BP); HEP    Person(s) Educated Patient   Methods Explanation;Demonstration;Handout   Comprehension Verbalized understanding;Need further instruction;Returned demonstration             PT Long Term Goals - 11/30/14 1149    PT LONG TERM GOAL #1   Title independent with HEP (01/05/15)   Status On-going   PT LONG TERM GOAL #2   Title demonstrate negative positional testing (01/05/15)   Status Achieved   PT LONG TERM GOAL #3   Title report 75% improvement in symptoms (01/05/15)   Status On-going               Plan - 11/30/14 1148    Clinical Impression Statement Pt negative for orthostatics however increase  in symptoms with position changes.  BP elevated throughout testing for orthostatics.  Increased difficulty with vestibular and balance exercises.  Negative positional testing today but continues with symptoms.  Will plan to address through gaze and habituation as needed.   PT Next Visit Plan review gaze x 1; continue vestibular/balance exercises   Consulted and Agree with Plan of Care Patient        Problem List Patient Active Problem List   Diagnosis Date Noted  . Thyromegaly 11/16/2014  . BPV (benign positional vertigo) 11/16/2014  . Tension headache 11/16/2014  . IBS (irritable bowel syndrome) 11/16/2014   Laureen Abrahams, PT, DPT 11/30/2014 11:50 AM  [facility] [address]  [address] [place], [place], [address] Phone: [phone]   Fax:  [phone]

## 2014-12-07 ENCOUNTER — Telehealth: Payer: Self-pay | Admitting: Family

## 2014-12-07 NOTE — Telephone Encounter (Signed)
Pre visit letter sent  °

## 2014-12-08 ENCOUNTER — Ambulatory Visit: Payer: PRIVATE HEALTH INSURANCE | Admitting: Physical Therapy

## 2014-12-08 VITALS — BP 140/87

## 2014-12-08 DIAGNOSIS — R42 Dizziness and giddiness: Secondary | ICD-10-CM

## 2014-12-08 DIAGNOSIS — H8111 Benign paroxysmal vertigo, right ear: Secondary | ICD-10-CM | POA: Diagnosis not present

## 2014-12-08 NOTE — Therapy (Signed)
[facility] [address]  [place], [place], [address] Phone: [phone]   Fax:  [phone]  Physical Therapy Treatment  Patient Details  Name: Jaime Harris MRN: [id number] Date of Birth: October 11, 1955 Referring Provider:  Debbrah Alar, NP  Encounter Date: 12/08/2014      PT End of Session - 12/08/14 1142    Visit Number 3   Number of Visits 6   Date for PT Re-Evaluation 01/05/15   PT Start Time 1100   PT Stop Time 1138   PT Time Calculation (min) 38 min   Activity Tolerance Patient tolerated treatment well   Behavior During Therapy [facility] for tasks assessed/performed      Past Medical History  Diagnosis Date  . Vertigo   . Thyroid disease     enlarged thyroid  . Sickle cell trait     Past Surgical History  Procedure Laterality Date  . Tubal ligation    . Foot surgery Bilateral     Pt states she had bones removed from the sides of her feet and middle toes so she could walk better.  . Tonsillectomy  1975    Filed Vitals:   12/08/14 1123  BP: 140/87    Visit Diagnosis:  Benign paroxysmal positional vertigo, right  Dizziness and giddiness      Subjective Assessment - 12/08/14 1103    Symptoms Doing gaze stabilization 3x/day.  Exercises still making her dizzy.  No dizziness currently but reports no change in symptoms.   Pertinent History sickle cell   Limitations Walking   Currently in Pain? No/denies                Vestibular Assessment - 12/08/14 1109    Positional Testing   Sidelying Test Sidelying Right;Sidelying Left   Sidelying Right   Sidelying Right Duration x 2 min without resolve   Sidelying Right Symptoms No nystagmus;Other (comment)  c/o "funny" and wooziness; symptoms with returning to sittin   Sidelying Left   Sidelying Left Duration negative   Sidelying Left Symptoms No nystagmus  symptoms with returning to sitting                Vestibular  Treatment/Exercise - 12/08/14 1113    Vestibular Treatment/Exercise   Habituation Exercises Nestor Lewandowsky   Gaze Exercises X1 Viewing Horizontal;X1 Viewing Vertical   Nestor Lewandowsky   Number of Reps  5   Symptom Description  symptoms most consistent with returning to sitting rather than sit to sidelying; only symptomatic with 1st rep to R going to sidelying   X1 Viewing Horizontal   Foot Position sitting, standing on level and compliant surface   Time --  30 sec   Reps 1   X1 Viewing Vertical   Foot Position sitting, standing on level and compliant surface   Time --  30 sec   Reps 1               PT Education - 12/08/14 1142    Education provided Yes   Education Details HEP for habituation   Person(s) Educated Patient   Methods Demonstration;Explanation;Handout   Comprehension Verbalized understanding             PT Long Term Goals - 11/30/14 1149    PT LONG TERM GOAL #1   Title independent with HEP (01/05/15)   Status On-going   PT LONG TERM GOAL #2   Title demonstrate negative positional testing (01/05/15)   Status  Achieved   PT LONG TERM GOAL #3   Title report 75% improvement in symptoms (01/05/15)   Status On-going               Plan - 12/08/14 1143    Clinical Impression Statement Pt reported no dizziness today, however states nothing is improving or getting better.  Issued habituation to address supine to sit and symptoms pt has with position changes.  Unsure if pt has true vestibular dysfunction or if symptoms are from something else.  If no improvement next week recommend return to MD.   PT Next Visit Plan review gaze x 1; continue vestibular/balance exercises   Consulted and Agree with Plan of Care Patient        Problem List Patient Active Problem List   Diagnosis Date Noted  . Thyromegaly 11/16/2014  . BPV (benign positional vertigo) 11/16/2014  . Tension headache 11/16/2014  . IBS (irritable bowel syndrome) 11/16/2014   Laureen Abrahams, PT, DPT 12/08/2014 11:45 AM  [facility] [address]  [place], [place], [address] Phone: [phone]   Fax:  [phone]

## 2014-12-08 NOTE — Patient Instructions (Signed)
Sit to Side-Lying   Sit on edge of bed. 1. Turn head 45 to left. 2. Maintain head position and lie down quickly on right side. Hold until symptoms subside plus 30 sec (or no longer than 2 min). 3. Sit up quickly. Hold until symptoms subside plus 30 seconds. 4. Turn head 45 to right. 5. Maintain head position and lie down quickly on left side. Hold until symptoms subside plus 30 seconds. 6. Sit up quickly and hold until symptoms subside plus 30 seconds. Repeat sequence _3-5___ times per session. Do __2-3__ sessions per day.  Copyright  VHI. All rights reserved.   Laureen Abrahams, PT, DPT 12/08/2014 11:29 AM  [facility] [place], [place] [address]  [phone] (office) [phone] (fax)

## 2014-12-15 ENCOUNTER — Ambulatory Visit: Payer: PRIVATE HEALTH INSURANCE | Attending: Family | Admitting: Physical Therapy

## 2014-12-15 VITALS — BP 140/80

## 2014-12-15 DIAGNOSIS — H8111 Benign paroxysmal vertigo, right ear: Secondary | ICD-10-CM | POA: Insufficient documentation

## 2014-12-15 DIAGNOSIS — R42 Dizziness and giddiness: Secondary | ICD-10-CM | POA: Insufficient documentation

## 2014-12-15 NOTE — Therapy (Signed)
[facility] [address]  [place], [place], [address] Phone: [phone]   Fax:  [phone]  Physical Therapy Treatment  Patient Details  Name: Jaime Harris MRN: [id number] Date of Birth: [birth year] Referring Provider:  Debbrah Alar, NP  Encounter Date: 12/15/2014      PT End of Session - 12/15/14 1144    Visit Number 4   Number of Visits 6   Date for PT Re-Evaluation 01/05/15   PT Start Time 1100   PT Stop Time 1127   PT Time Calculation (min) 27 min   Activity Tolerance Patient tolerated treatment well   Behavior During Therapy [facility] for tasks assessed/performed      Past Medical History  Diagnosis Date  . Vertigo   . Thyroid disease     enlarged thyroid  . Sickle cell trait     Past Surgical History  Procedure Laterality Date  . Tubal ligation    . Foot surgery Bilateral     Pt states she had bones removed from the sides of her feet and middle toes so she could walk better.  . Tonsillectomy  1975    Filed Vitals:   12/15/14 1116  BP: 140/80    Visit Diagnosis:  Dizziness and giddiness  Benign paroxysmal positional vertigo, right      Subjective Assessment - 12/15/14 1100    Subjective Had a really bad day yesterday.  Reports every time she got up she felt dizzy.  Denies dizziness with lying down or rolling over.  Pt also reports nausea and anxiety with symptoms. Reports compliance with exercises but no improvement in symptoms   Pertinent History sickle cell   Limitations Walking   Patient Stated Goals to eliminate dizziness   Currently in Pain? No/denies                Vestibular Assessment - 12/15/14 1116    Positional Testing   Horizontal Canal Testing Horizontal Canal Right;Horizontal Canal Left   Dix-Hallpike Right   Dix-Hallpike Right Symptoms No nystagmus   Dix-Hallpike Left   Dix-Hallpike Left Symptoms No nystagmus  c/o blurry vision without resolve   Horizontal Canal Right   Horizontal Canal Right Symptoms Normal   Horizontal Canal Left   Horizontal Canal Left Symptoms Normal                       PT Education - 12/15/14 1142    Education provided Yes   Education Details recommendation for follow up with MD for further evaluation   Person(s) Educated Patient   Methods Explanation   Comprehension Verbalized understanding             PT Long Term Goals - 12/15/14 1149    PT LONG TERM GOAL #1   Title independent with HEP (01/05/15)   Status Achieved   PT LONG TERM GOAL #2   Title demonstrate negative positional testing (01/05/15)   Status Achieved   PT LONG TERM GOAL #3   Title report 75% improvement in symptoms (01/05/15)   Status Not Met               Plan - 12/15/14 1145    Clinical Impression Statement Pt reports no improvement in symptoms since beginning PT and reports compliance with HEP without improvement.  At this time recommend return to MD for further investigation of differential diagnoses (?posterior circulation insufficiency v/s vestibular migraine v/s other medical diagnosis) as all  vestibular testing negative and no improvement in symptoms.  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  Recommend pt discuss with MD continued symptoms.  May need to see specialist (neurologist, ENT, etc) depending on clinical findings.     PT Next Visit Plan d/c today as no improvement in symptoms   Consulted and Agree with Plan of Care Patient        Problem List Patient Active Problem List   Diagnosis Date Noted  . Thyromegaly 11/16/2014  . BPV (benign positional vertigo) 11/16/2014  . Tension headache 11/16/2014  . IBS (irritable bowel syndrome) 11/16/2014   Laureen Abrahams, PT, DPT 12/15/2014 11:52 AM  [facility] [place], [place], [address] Phone: [phone]   Fax:  [phone]      PHYSICAL THERAPY DISCHARGE SUMMARY  Visits from Start of Care: 4  Current functional level related to goals / functional outcomes: See above; pt met 2/3 LTGs, however no improvement in symptoms   Remaining deficits: Continued vertigo; recommend follow up with MD to discuss further options.   Feel pt does not have a true vestibular dysfunction.   Education / Equipment: HEP  Plan: Patient agrees to discharge.  Patient goals were partially met. Patient is being discharged due to lack of progress.  ?????    Laureen Abrahams, PT, DPT 12/15/2014 11:53 AM  [facility] [place], [place] [address]  [phone] (office) [phone] (fax)

## 2014-12-22 ENCOUNTER — Ambulatory Visit: Payer: PRIVATE HEALTH INSURANCE

## 2014-12-24 ENCOUNTER — Encounter: Payer: Self-pay | Admitting: *Deleted

## 2014-12-24 ENCOUNTER — Telehealth: Payer: Self-pay | Admitting: *Deleted

## 2014-12-24 NOTE — Telephone Encounter (Signed)
Pre-Visit Call completed with patient and chart updated.   Pre-Visit Info documented in Specialty Comments under [employer].    

## 2014-12-25 ENCOUNTER — Ambulatory Visit (INDEPENDENT_AMBULATORY_CARE_PROVIDER_SITE_OTHER): Payer: PRIVATE HEALTH INSURANCE | Admitting: Family

## 2014-12-25 ENCOUNTER — Encounter: Payer: Self-pay | Admitting: Family

## 2014-12-25 VITALS — BP 120/80 | HR 86 | Temp 97.9°F | Resp 16 | Ht 64.5 in | Wt 180.0 lb

## 2014-12-25 DIAGNOSIS — E2839 Other primary ovarian failure: Secondary | ICD-10-CM | POA: Diagnosis not present

## 2014-12-25 DIAGNOSIS — G5600 Carpal tunnel syndrome, unspecified upper limb: Secondary | ICD-10-CM | POA: Insufficient documentation

## 2014-12-25 DIAGNOSIS — Z0000 Encounter for general adult medical examination without abnormal findings: Secondary | ICD-10-CM | POA: Diagnosis not present

## 2014-12-25 DIAGNOSIS — G5601 Carpal tunnel syndrome, right upper limb: Secondary | ICD-10-CM

## 2014-12-25 DIAGNOSIS — R42 Dizziness and giddiness: Secondary | ICD-10-CM

## 2014-12-25 DIAGNOSIS — Z23 Encounter for immunization: Secondary | ICD-10-CM

## 2014-12-25 DIAGNOSIS — Z720 Tobacco use: Secondary | ICD-10-CM

## 2014-12-25 DIAGNOSIS — G43909 Migraine, unspecified, not intractable, without status migrainosus: Secondary | ICD-10-CM | POA: Insufficient documentation

## 2014-12-25 DIAGNOSIS — G43809 Other migraine, not intractable, without status migrainosus: Secondary | ICD-10-CM

## 2014-12-25 LAB — HEPATIC FUNCTION PANEL
ALT: 18 U/L (ref 0–35)
AST: 16 U/L (ref 0–37)
Albumin: 4 g/dL (ref 3.5–5.2)
Alkaline Phosphatase: 149 U/L — ABNORMAL HIGH (ref 39–117)
BILIRUBIN TOTAL: 0.4 mg/dL (ref 0.2–1.2)
Bilirubin, Direct: 0.1 mg/dL (ref 0.0–0.3)
Total Protein: 7.3 g/dL (ref 6.0–8.3)

## 2014-12-25 LAB — CBC WITH DIFFERENTIAL/PLATELET
BASOS PCT: 0.3 % (ref 0.0–3.0)
Basophils Absolute: 0 10*3/uL (ref 0.0–0.1)
Eosinophils Absolute: 0.2 10*3/uL (ref 0.0–0.7)
Eosinophils Relative: 3 % (ref 0.0–5.0)
HEMATOCRIT: 39.6 % (ref 36.0–46.0)
HEMOGLOBIN: 13.8 g/dL (ref 12.0–15.0)
Lymphocytes Relative: 47.6 % — ABNORMAL HIGH (ref 12.0–46.0)
Lymphs Abs: 2.4 10*3/uL (ref 0.7–4.0)
MCHC: 34.8 g/dL (ref 30.0–36.0)
MCV: 86.7 fl (ref 78.0–100.0)
MONOS PCT: 5.7 % (ref 3.0–12.0)
Monocytes Absolute: 0.3 10*3/uL (ref 0.1–1.0)
Neutro Abs: 2.2 10*3/uL (ref 1.4–7.7)
Neutrophils Relative %: 43.4 % (ref 43.0–77.0)
PLATELETS: 191 10*3/uL (ref 150.0–400.0)
RBC: 4.57 Mil/uL (ref 3.87–5.11)
RDW: 15.5 % (ref 11.5–15.5)
WBC: 5.1 10*3/uL (ref 4.0–10.5)

## 2014-12-25 LAB — URINALYSIS, ROUTINE W REFLEX MICROSCOPIC
Bilirubin Urine: NEGATIVE
KETONES UR: NEGATIVE
LEUKOCYTES UA: NEGATIVE
Nitrite: NEGATIVE
PH: 6 (ref 5.0–8.0)
Specific Gravity, Urine: 1.02 (ref 1.000–1.030)
TOTAL PROTEIN, URINE-UPE24: NEGATIVE
Urine Glucose: NEGATIVE
Urobilinogen, UA: 0.2 (ref 0.0–1.0)
WBC, UA: NONE SEEN (ref 0–?)

## 2014-12-25 LAB — LIPID PANEL
CHOL/HDL RATIO: 5
Cholesterol: 226 mg/dL — ABNORMAL HIGH (ref 0–200)
HDL: 47.2 mg/dL (ref >39.00)
LDL Cholesterol: 154 mg/dL — ABNORMAL HIGH (ref 0–99)
NonHDL: 178.8
TRIGLYCERIDES: 126 mg/dL (ref 0.0–149.0)
VLDL: 25.2 mg/dL (ref 0.0–40.0)

## 2014-12-25 LAB — BASIC METABOLIC PANEL
BUN: 12 mg/dL (ref 6–23)
CO2: 29 mEq/L (ref 19–32)
Calcium: 9.8 mg/dL (ref 8.4–10.5)
Chloride: 107 mEq/L (ref 96–112)
Creatinine, Ser: 0.84 mg/dL (ref 0.40–1.20)
GFR: 89.32 mL/min (ref >60.00)
Glucose, Bld: 83 mg/dL (ref 70–99)
POTASSIUM: 4.1 meq/L (ref 3.5–5.1)
Sodium: 141 mEq/L (ref 135–145)

## 2014-12-25 LAB — TSH: TSH: 0.85 u[IU]/mL (ref 0.35–4.50)

## 2014-12-25 MED ORDER — SUMATRIPTAN SUCCINATE 50 MG PO TABS: 50.0000 mg | ORAL_TABLET | ORAL | Status: DC | PRN

## 2014-12-25 MED ORDER — VARENICLINE TARTRATE 0.5 MG X 11 & 1 MG X 42 PO MISC: ORAL | Status: DC

## 2014-12-25 NOTE — Assessment & Plan Note (Signed)
?   If related to migraine.  Will refer to neuro for further evaluation.

## 2014-12-25 NOTE — Addendum Note (Signed)
Addended by: Kelle Darting A on: 12/25/2014 09:35 AM   Modules accepted: Orders

## 2014-12-25 NOTE — Assessment & Plan Note (Signed)
Trial of prn imitrex.

## 2014-12-25 NOTE — Assessment & Plan Note (Signed)
Discussed trial of wrist brace and short course of NSAIDS (can take motrin for a few days to start). Has a highly repetitive job.  (makes eye glasses).

## 2014-12-25 NOTE — Assessment & Plan Note (Signed)
Discussed importance of regular exercise. Refer for dexa and colo, mammo and pap up to date. Tdap today.

## 2014-12-25 NOTE — Assessment & Plan Note (Signed)
Trial of chantix. Common side effects including rare risk of suicide ideation was discussed with the patient today.  Patient is instructed to go directly to the ED if this occurs.  We discussed that patient can continue to smoke for 1 week after starting chantix, but then must discontinue cigarettes. She is also instructed to contact us prior to completion of the starter month pack for an rx for the continuation month pack.  5 minutes spent with patient today on tobacco cessation counseling.

## 2014-12-25 NOTE — Progress Notes (Signed)
Pre visit review using our clinic review tool, if applicable. No additional management support is needed unless otherwise documented below in the visit note. 

## 2014-12-25 NOTE — Patient Instructions (Addendum)
Please complete lab work prior to leaving.  Please arrange dental visit for routine dental care. You will be contacted about your referral to neurology and about your bone density. Call us if you have not heard back in 1 week about these. Start chantix. Call if you develop depression/mood changes on chantix. Let me know how you are doing in about 3 weeks and if you are doing well we will send the 2nd month prescription. Start imitrex as needed for headache. Follow up in 3 months.

## 2014-12-25 NOTE — Progress Notes (Signed)
Subjective:    Patient ID: Jaime Harris, female    DOB: Nov 28, 1955, 59 y.o.   MRN: [id number]  HPI   Jaime Harris is a 59 yr old female who presents today for cpx.   Immunizations:  due Diet: reports diet is healthy Exercise: not exercising Colonoscopy: >10 yrs ago.  Dexa: due Pap Smear: August 2015- normal Mammogram:  Up to date Tobacco abuse-  Has never quit for more than a few days. Reports that she is smoking about 10-15 cigarettes a day. Eye exam:  1/16 Dental:  Due  BPV- No improvement with meclizine or PT.  Reports ongoing dizziness x 1 year.  Intermittent through the day.  Last night she developed headache with her dizziness as well as weakness.   Wt Readings from Last 3 Encounters:  12/25/14 180 lb (81.647 kg)  11/16/14 175 lb (79.379 kg)  08/21/14 180 lb (81.647 kg)     Review of Systems  Constitutional: Negative for unexpected weight change.  HENT: Negative for hearing loss and rhinorrhea.   Eyes: Negative for visual disturbance.  Respiratory: Negative for cough.   Cardiovascular:       Occasional LE swelling  Gastrointestinal: Negative for nausea and diarrhea.       Occasional constipation- use mom prn  Genitourinary: Negative for dysuria and frequency.  Musculoskeletal: Negative for myalgias and arthralgias.  Skin: Negative for rash.  Neurological: Positive for headaches.  Hematological: Negative for adenopathy.  Psychiatric/Behavioral: Negative for dysphoric mood and agitation.   Past Medical History  Diagnosis Date  . Vertigo   . Thyroid disease     enlarged thyroid  . Sickle cell trait     History   Social History  . Marital Status: Single    Spouse Name: N/A  . Number of Children: N/A  . Years of Education: N/A   Occupational History  . Not on file.   Social History Main Topics  . Smoking status: Current Every Day Smoker -- 0.50 packs/day    Types: Cigarettes  . Smokeless tobacco: Not on file  . Alcohol Use: 0.0 oz/week    0  Standard drinks or equivalent per week     Comment: occasional  . Drug Use: No  . Sexual Activity: Not on file   Other Topics Concern  . Not on file   Social History Narrative   Makes eye glasses.     Single,   GED   She has 3 children   65- Son [name]- lives locally- he has one son   51- Daughter [name]- 7 children (4 of these children live with the pt- she is a primary care giver)   [place]- lives in [place] 1 daughter   Enjoys reading    Past Surgical History  Procedure Laterality Date  . Tubal ligation    . Foot surgery Bilateral     Pt states she had bones removed from the sides of her feet and middle toes so she could walk better.  . Tonsillectomy  1975    Family History  Problem Relation Age of Onset  . Heart disease Mother     Investment banker, corporate  . Diabetes Mother   . Heart disease Paternal Grandmother     died of CHF  . Diabetes Paternal Grandmother   . Cancer Neg Hx   . Kidney disease Neg Hx     No Known Allergies  Current Outpatient Prescriptions on File Prior to Visit  Medication Sig Dispense Refill  . ibuprofen (  ADVIL,MOTRIN) 200 MG tablet Take 200 mg by mouth every 6 (six) hours as needed.     No current facility-administered medications on file prior to visit.    BP 120/80 mmHg  Pulse 86  Temp(Src) 97.9 F (36.6 C) (Oral)  Resp 16  Ht 5' 4.5" (1.638 m)  Wt 180 lb (81.647 kg)  BMI 30.43 kg/m2  SpO2 96%       Objective:   Physical Exam  Physical Exam  Constitutional: She is oriented to person, place, and time. She appears well-developed and well-nourished. No distress.  HENT:  Head: Normocephalic and atraumatic.  Right Ear: Tympanic membrane and ear canal normal.  Left Ear: Tympanic membrane and ear canal normal.  Mouth/Throat: Oropharynx is clear and moist.  Eyes: Pupils are equal, round, and reactive to light. No scleral icterus.  Neck: Normal range of motion. No thyromegaly present.  Cardiovascular: Normal rate  and regular rhythm.   No murmur heard. Pulmonary/Chest: Effort normal and breath sounds normal. No respiratory distress. He has no wheezes. She has no rales. She exhibits no tenderness.  Abdominal: Soft. Bowel sounds are normal. He exhibits no distension and no mass. There is no tenderness. There is no rebound and no guarding.  Musculoskeletal: She exhibits no edema.  Lymphadenopathy:    She has no cervical adenopathy.  Neurological: She is alert and oriented to person, place, and time. She has normal patellar reflexes. She exhibits normal muscle tone. Coordination normal. + phalans on R, + tinels on R Skin: Skin is warm and dry.  Psychiatric: She has a normal mood and affect. Her behavior is normal. Judgment and thought content normal.  Breasts: Examined lying Right: Without masses, retractions, discharge or axillary adenopathy.  Left: Without masses, retractions, discharge or axillary adenopathy.          Assessment & Plan:        Assessment & Plan:

## 2014-12-29 ENCOUNTER — Other Ambulatory Visit (INDEPENDENT_AMBULATORY_CARE_PROVIDER_SITE_OTHER): Payer: PRIVATE HEALTH INSURANCE

## 2014-12-29 ENCOUNTER — Encounter: Payer: Self-pay | Admitting: Gastroenterology

## 2014-12-29 DIAGNOSIS — R748 Abnormal levels of other serum enzymes: Secondary | ICD-10-CM | POA: Diagnosis not present

## 2014-12-29 LAB — VITAMIN D 25 HYDROXY (VIT D DEFICIENCY, FRACTURES): VITD: 10.73 ng/mL — ABNORMAL LOW (ref 30.00–100.00)

## 2014-12-30 ENCOUNTER — Telehealth: Payer: Self-pay | Admitting: Family

## 2014-12-30 DIAGNOSIS — E559 Vitamin D deficiency, unspecified: Secondary | ICD-10-CM

## 2014-12-30 MED ORDER — VITAMIN D (ERGOCALCIFEROL) 1.25 MG (50000 UNIT) PO CAPS: 50000.0000 [IU] | ORAL_CAPSULE | ORAL | Status: DC

## 2014-12-30 NOTE — Telephone Encounter (Signed)
pls let pt know that vit d is very low. Start once weekly vit D, follow up in 3 months for vit d level.

## 2014-12-30 NOTE — Telephone Encounter (Signed)
Left detailed message on cell# and to call and scheduled lab appt in 3 months. Future lab order entered.

## 2014-12-31 ENCOUNTER — Ambulatory Visit (INDEPENDENT_AMBULATORY_CARE_PROVIDER_SITE_OTHER)
Admission: RE | Admit: 2014-12-31 | Discharge: 2014-12-31 | Disposition: A | Discharge status: Home / Self Care | Payer: PRIVATE HEALTH INSURANCE | Source: Ambulatory Visit | Attending: Family | Admitting: Family

## 2014-12-31 DIAGNOSIS — E2839 Other primary ovarian failure: Secondary | ICD-10-CM | POA: Diagnosis not present

## 2015-01-03 ENCOUNTER — Encounter: Payer: Self-pay | Admitting: Family

## 2015-01-03 ENCOUNTER — Telehealth: Payer: Self-pay | Admitting: Family

## 2015-01-03 NOTE — Telephone Encounter (Signed)
Opened in error

## 2015-01-18 ENCOUNTER — Telehealth: Payer: Self-pay | Admitting: Family

## 2015-01-18 ENCOUNTER — Ambulatory Visit (AMBULATORY_SURGERY_CENTER): Payer: Self-pay | Admitting: *Deleted

## 2015-01-18 VITALS — Ht 65.0 in | Wt 180.6 lb

## 2015-01-18 DIAGNOSIS — Z86010 Personal history of colonic polyps: Secondary | ICD-10-CM

## 2015-01-18 NOTE — Progress Notes (Signed)
No egg or soy allergy No issues with past sedation No diet pills No home 02 use emmi video to e mail Last colon 18 years ago [facility], pt thinks with Dr Ferdinand Lango. She states she had a few polyps. Per sherri on 3rd, we wont be able to get old records from that long ago so no release completed.

## 2015-01-18 NOTE — Telephone Encounter (Signed)
Spoke with pt. Advised her to contact pharmacy and have them transfer Rxs from 12/25/14. Pt is not on omeprazole and not requesting Rx at this time. Pt states dizziness is worsening. Now occurs nearly every time she moves. Reports having to lie down over the weekend as her legs nearly gave way with her during one episode. Appt with neuro 01/26/15. Pt states that meclizine does not help.  Please advise.

## 2015-01-18 NOTE — Telephone Encounter (Signed)
Lets bring her back in for re-evaluation in the office due to worsening symptoms.

## 2015-01-18 NOTE — Telephone Encounter (Signed)
Notified pt. States she is unable to come in to the office today. Has pre-op assessment for colonoscopy at 4pm today. Scheduled appt for tomorrow at 9:30am and advised pt to proceed to the ER if symptoms worsen before appt.

## 2015-01-18 NOTE — Telephone Encounter (Signed)
Relation to pt: self  Call back number: [place]: [facility] [place] [address] [place], [place] [address] [phone]   Reason for call:  Pt states its cheaper at [facility] requesting RX to please send to   omeprazole (PRILOSEC) 20 MG capsule  SUMAtriptan (IMITREX) 50 MG tablet  varenicline (CHANTIX STARTING MONTH PAK) 0.5 MG X 11 & 1 MG X 42 tablet

## 2015-01-19 ENCOUNTER — Encounter: Payer: Self-pay | Admitting: Family

## 2015-01-19 ENCOUNTER — Ambulatory Visit (INDEPENDENT_AMBULATORY_CARE_PROVIDER_SITE_OTHER): Payer: PRIVATE HEALTH INSURANCE | Admitting: Family

## 2015-01-19 VITALS — BP 136/88 | HR 80 | Temp 97.8°F | Resp 18 | Ht 64.5 in | Wt 177.6 lb

## 2015-01-19 DIAGNOSIS — R42 Dizziness and giddiness: Secondary | ICD-10-CM

## 2015-01-19 DIAGNOSIS — I951 Orthostatic hypotension: Secondary | ICD-10-CM

## 2015-01-19 LAB — CORTISOL: Cortisol, Plasma: 5.3 ug/dL

## 2015-01-19 NOTE — Patient Instructions (Addendum)
Please complete lab work prior to leaving.  You will be contacted about your MRI and echo. Please keep upcoming appointment with Neurology. Follow up in 6 weeks.

## 2015-01-19 NOTE — Progress Notes (Signed)
Subjective:    Patient ID: Jaime Harris, female    DOB: 09/10/1956, 59 y.o.   MRN: [id number]  HPI  Jaime Harris is a 59 yr old female who presents today with complaint of dizziness. Dizziness has been present x 1 year.  Has tried meclizine and Vestibular rehab without improvement in her symptoms.  Electrolytes, blood count and thyroid testing were normal last month. She did have an ED visit last December due to dizziness and HA.  At that time CT head was performed and this was negative.  She had neg LE dopplers at that time.   Pt reports pain in the back of her neck.  Reports last night her head was pounding, "I could hear it in my ears." Reports on Sunday she became dizzy and had to lean on the counter because her legs felt weak.  Reports dizziness is now most often associated with HA. Reports that her head feels "heavy."    Reports that she is not currently feeling like things are spinning but reports "my focus is not very good." She denies CP/SOB or heart palpitations.  Notes sometimes she has trouble word finding but denies hx of unilateral weakness/drooping or slurred speech.    Denies nasal congestion/sinus pain/pressure.    Review of Systems See HPI  Past Medical History  Diagnosis Date  . Vertigo   . Thyroid disease     enlarged thyroid  . Sickle cell trait   . Vitamin D deficiency   . Hyperlipidemia     borderline    History   Social History  . Marital Status: Single    Spouse Name: N/A  . Number of Children: N/A  . Years of Education: N/A   Occupational History  . Not on file.   Social History Main Topics  . Smoking status: Current Every Day Smoker -- 0.50 packs/day    Types: Cigarettes  . Smokeless tobacco: Never Used  . Alcohol Use: 0.0 oz/week    0 Standard drinks or equivalent per week     Comment: occasional  . Drug Use: No  . Sexual Activity: Not on file   Other Topics Concern  . Not on file   Social History Narrative   Makes eye glasses.     Single,   GED   She has 3 children   57- Son [name]- lives locally- he has one son   62- Daughter [name]- 7 children (4 of these children live with the pt- she is a primary care giver)   [place]- lives in [place] 1 daughter   Enjoys reading    Past Surgical History  Procedure Laterality Date  . Tubal ligation    . Foot surgery Bilateral     Pt states she had bones removed from the sides of her feet and middle toes so she could walk better.  . Tonsillectomy  1975    Family History  Problem Relation Age of Onset  . Heart disease Mother     Investment banker, corporate  . Diabetes Mother   . Heart disease Paternal Grandmother     died of CHF  . Diabetes Paternal Grandmother   . Cancer Neg Hx   . Kidney disease Neg Hx   . Colon cancer Neg Hx   . Colon polyps Neg Hx   . Rectal cancer Neg Hx   . Stomach cancer Neg Hx     No Known Allergies  Current Outpatient Prescriptions on File Prior to Visit  Medication Sig  Dispense Refill  . bisacodyl (DULCOLAX) 5 MG EC tablet Take 5 mg by mouth once. For 5-23 colon only    . ibuprofen (ADVIL,MOTRIN) 200 MG tablet Take 200 mg by mouth every 6 (six) hours as needed.    . polyethylene glycol powder (GLYCOLAX/MIRALAX) powder Take 1 Container by mouth once. For colon 5-23 then stop    . SUMAtriptan (IMITREX) 50 MG tablet Take 1 tablet (50 mg total) by mouth every 2 (two) hours as needed for migraine. May repeat in 2 hours if headache persists or recurs. 10 tablet 0  . varenicline (CHANTIX STARTING MONTH PAK) 0.5 MG X 11 & 1 MG X 42 tablet Take one 0.5 mg tablet by mouth once daily for 3 days, then increase to one 0.5 mg tablet twice daily for 4 days, then increase to one 1 mg tablet twice daily. (Patient not taking: Reported on 01/18/2015) 53 tablet 0  . Vitamin D, Ergocalciferol, (DRISDOL) 50000 UNITS CAPS capsule Take 1 capsule (50,000 Units total) by mouth every 7 (seven) days. 12 capsule 0   No current facility-administered medications  on file prior to visit.    BP 136/88 mmHg  Pulse 80  Temp(Src) 97.8 F (36.6 C) (Oral)  Resp 18  Ht 5' 4.5" (1.638 m)  Wt 177 lb 9.6 oz (80.559 kg)  BMI 30.03 kg/m2  SpO2 99%       Objective:   Physical Exam  Constitutional: She is oriented to person, place, and time. She appears well-developed and well-nourished.  HENT:  Head: Normocephalic.  Eyes: EOM are normal. Pupils are equal, round, and reactive to light.  Cardiovascular: Normal rate, regular rhythm and normal heart sounds.   No murmur heard. Pulses:      Carotid pulses are 2+ on the right side, and 2+ on the left side. No carotid bruits noted  Pulmonary/Chest: Effort normal and breath sounds normal. No respiratory distress. She has no wheezes.  Musculoskeletal: She exhibits no edema.  Neck full ROM, but some mild discomfort with extension  Lymphadenopathy:    She has no cervical adenopathy.  Neurological: She is alert and oriented to person, place, and time. She exhibits normal muscle tone. Coordination normal.  Psychiatric: She has a normal mood and affect. Her behavior is normal. Judgment and thought content normal.          Assessment & Plan:

## 2015-01-19 NOTE — Progress Notes (Signed)
Pre visit review using our clinic review tool, if applicable. No additional management support is needed unless otherwise documented below in the visit note. 

## 2015-01-19 NOTE — Assessment & Plan Note (Addendum)
Deteriorated. Will obtain MRI of the brain. Keep upcoming appointment with neurology.   Orthostatics performed today in the office note a 22 point drop in SBP.  Will obtain 2D echo and a serum cortisol level to assess for adrenal insufficiency. EKG is performed today and notes NSR.

## 2015-01-21 ENCOUNTER — Encounter: Payer: Self-pay | Admitting: Family

## 2015-01-26 ENCOUNTER — Ambulatory Visit (INDEPENDENT_AMBULATORY_CARE_PROVIDER_SITE_OTHER): Payer: PRIVATE HEALTH INSURANCE | Admitting: Neurology

## 2015-01-26 ENCOUNTER — Encounter: Payer: Self-pay | Admitting: Neurology

## 2015-01-26 VITALS — BP 134/86 | HR 88 | Resp 18 | Ht 64.0 in | Wt 179.9 lb

## 2015-01-26 DIAGNOSIS — Z720 Tobacco use: Secondary | ICD-10-CM

## 2015-01-26 DIAGNOSIS — I951 Orthostatic hypotension: Secondary | ICD-10-CM

## 2015-01-26 DIAGNOSIS — G44221 Chronic tension-type headache, intractable: Secondary | ICD-10-CM

## 2015-01-26 DIAGNOSIS — R42 Dizziness and giddiness: Secondary | ICD-10-CM | POA: Diagnosis not present

## 2015-01-26 MED ORDER — PREDNISONE 10 MG PO TABS: ORAL_TABLET | ORAL | Status: DC

## 2015-01-26 NOTE — Patient Instructions (Signed)
1.  Do not take sumatriptan 2.  To help break the headaches, we will start you a prednisone taper.  Take 6tabs x1day, then 5tabs x1day, then 4tabs x1day, then 3tabs x1day, then 2tabs x1day, then 1tab x1day, then STOP 3.  After finishing the prednisone taper, may resume ibuprofen or you can try naproxen 500mg  but no more than 2 days out of the week 4.  Will follow up MRI 5.  Follow up in 4 weeks.  If headaches not improved, may need to consider starting a daily medication.

## 2015-01-26 NOTE — Progress Notes (Signed)
NEUROLOGY CONSULTATION NOTE  Jaime Harris MRN: [id number] DOB: Jan 23, 1956  Referring provider: Debbrah Alar, NP Primary care provider: Debbrah Alar, NP  Reason for consult:  Dizziness, headache  HISTORY OF PRESENT ILLNESS: Jaime Harris is a 59 year old right-handed woman with enlarged thyroid, tobacco abuse and sickle cell trait who presents for headache and dizziness.  Records, labs and CT of head reviewed.   She started having dizziness about 2 years ago.  She describes losing focus, but not blurred vision.  This is accompanied by feeling of weakness in the legs, hearing muffled, and dizziness described as lightheadedness (but not spinning).  It lasts 2 minutes but occurs several times a day.  It occurs spontaneously, even when already standing up.  If she is walking during a spell, she feels off-balance.  Over the past month, she developed a new headache.  It is bi-temporal and in the back of the neck.  It is a non-throbbing pressure headache, about 8/10.  There is photophobia but no nausea or phonophobia.  It is constant.  She has been taking ibuprofen daily.  Meclizine and vestibular rehab were ineffective.  She was found to have positive orthostatics.  EKG showed NSR.  Cortisol level was 5.3.  TSH was 0.85.  A 2D echo is scheduled.  She did have a CT of the head performed on 08/21/14 to evaluate her symptoms.  It was negative.  An MRI of the brain is scheduled.  She has no prior history of headache.  She has no family history of headache or aneurysms.  She was prescribed sumatriptan for presumed migraine but she hasn't picked it up yet.  She drinks coffee daily.  She sleeps well.  PAST MEDICAL HISTORY: Past Medical History  Diagnosis Date  . Vertigo   . Thyroid disease     enlarged thyroid  . Sickle cell trait   . Vitamin D deficiency   . Hyperlipidemia     borderline  . Headache     PAST SURGICAL HISTORY: Past Surgical History  Procedure Laterality Date  .  Tubal ligation    . Foot surgery Bilateral     Pt states she had bones removed from the sides of her feet and middle toes so she could walk better.  . Tonsillectomy  1975    MEDICATIONS: Current Outpatient Prescriptions on File Prior to Visit  Medication Sig Dispense Refill  . ibuprofen (ADVIL,MOTRIN) 200 MG tablet Take 200 mg by mouth every 6 (six) hours as needed.    . Vitamin D, Ergocalciferol, (DRISDOL) 50000 UNITS CAPS capsule Take 1 capsule (50,000 Units total) by mouth every 7 (seven) days. 12 capsule 0  . bisacodyl (DULCOLAX) 5 MG EC tablet Take 5 mg by mouth once. For 5-23 colon only    . polyethylene glycol powder (GLYCOLAX/MIRALAX) powder Take 1 Container by mouth once. For colon 5-23 then stop     No current facility-administered medications on file prior to visit.    ALLERGIES: No Known Allergies  FAMILY HISTORY: Family History  Problem Relation Age of Onset  . Heart disease Mother     Investment banker, corporate  . Diabetes Mother   . Heart disease Paternal Grandmother     died of CHF  . Diabetes Paternal Grandmother   . Cancer Neg Hx   . Kidney disease Neg Hx   . Colon cancer Neg Hx   . Colon polyps Neg Hx   . Rectal cancer Neg Hx   . Stomach cancer Neg Hx   .  Diabetes Brother     SOCIAL HISTORY: History   Social History  . Marital Status: Single    Spouse Name: N/A  . Number of Children: N/A  . Years of Education: N/A   Occupational History  . Not on file.   Social History Main Topics  . Smoking status: Current Every Day Smoker -- 0.50 packs/day    Types: Cigarettes  . Smokeless tobacco: Never Used  . Alcohol Use: 0.0 oz/week    0 Standard drinks or equivalent per week     Comment: occasional  . Drug Use: No  . Sexual Activity: No   Other Topics Concern  . Not on file   Social History Narrative   Makes eye glasses.     Single,   GED   She has 3 children   14- Son [name]- lives locally- he has one son   71- Daughter [name]- 7 children  (4 of these children live with the pt- she is a primary care giver)   [place]- lives in [place] 1 daughter   Enjoys reading    REVIEW OF SYSTEMS: Constitutional: No fevers, chills, or sweats, no generalized fatigue, change in appetite Eyes: No visual changes, double vision, eye pain Ear, nose and throat: No hearing loss, ear pain, nasal congestion, sore throat Cardiovascular: No chest pain, palpitations Respiratory:  No shortness of breath at rest or with exertion, wheezes GastrointestinaI: No nausea, vomiting, diarrhea, abdominal pain, fecal incontinence Genitourinary:  No dysuria, urinary retention or frequency Musculoskeletal:  No neck pain, back pain Integumentary: No rash, pruritus, skin lesions Neurological: as above Psychiatric: No depression, insomnia, anxiety Endocrine: No palpitations, fatigue, diaphoresis, mood swings, change in appetite, change in weight, increased thirst Hematologic/Lymphatic:  No anemia, purpura, petechiae. Allergic/Immunologic: no itchy/runny eyes, nasal congestion, recent allergic reactions, rashes  PHYSICAL EXAM: Filed Vitals:   01/26/15 1054  BP: 134/86  Pulse: 88  Resp: 18   General: No acute distress Head:  Normocephalic/atraumatic Eyes:  fundi unremarkable, without vessel changes, exudates, hemorrhages or papilledema. Neck: supple, no paraspinal tenderness, full range of motion Back: No paraspinal tenderness Heart: regular rate and rhythm Lungs: Clear to auscultation bilaterally. Vascular: No carotid bruits. Neurological Exam: Mental status: alert and oriented to person, place, and time, recent and remote memory intact, fund of knowledge intact, attention and concentration intact, speech fluent and not dysarthric, language intact. Cranial nerves: CN I: not tested CN II: pupils equal, round and reactive to light, visual fields intact, fundi unremarkable, without vessel changes, exudates, hemorrhages or papilledema. CN III, IV, VI:   full range of motion, no nystagmus, no ptosis CN V: facial sensation intact CN VII: upper and lower face symmetric CN VIII: hearing intact CN IX, X: gag intact, uvula midline CN XI: sternocleidomastoid and trapezius muscles intact CN XII: tongue midline Bulk & Tone: normal, no fasciculations. Motor:  5/5 throughout Sensation:  Temperature and vibration intact. Deep Tendon Reflexes:  2+ throughout, toes downgoing. Finger to nose testing:  No dysmetria Heel to shin:  No dysmetria Gait:  Normal station and stride.  Able to turn.  Difficulty with tandem walking. Romberg negative.  IMPRESSION: Dizziness.  Possibly related to orthostatic hypotension.  Blood pressure is a little elevated today.  Symptoms are non-specific and not necessarily correlates with a primary neurologic etiology.  It is not vertigo.  Semiology is not consistent with migraine associated vertigo. Chronic tension type headache, complicated by medication overuse.  I do not think she has migraine. Tobacco abuse  PLAN: 1.  Will prescribe a prednisone taper to try and break the daily headache. 2.  She should stop ibuprofen.  After she finishes the prednisone taper, she may resume ibuprofen or other over the counter medication but limited to no more than 2 days out of the week. 3.  She has an upcoming MRI of the brain scheduled later this week.  We will see what this shows. 4.  Treat for orthostatic hypotension. 5.  Since I don't believe these are migraines, I would not have her start sumatriptan 6.  Follow up in 4 weeks.  If headache persists, consider a daily preventative. 7.  Smoking cessation, stop caffeine intake, exercise, increase hydration  Thank you for allowing me to take part in the care of this patient.  Metta Clines, DO  CC:  Debbrah Alar, NP

## 2015-01-30 ENCOUNTER — Ambulatory Visit (HOSPITAL_BASED_OUTPATIENT_CLINIC_OR_DEPARTMENT_OTHER)
Admission: RE | Admit: 2015-01-30 | Discharge: 2015-01-30 | Disposition: A | Discharge status: Home / Self Care | Payer: PRIVATE HEALTH INSURANCE | Source: Ambulatory Visit | Attending: Family | Admitting: Family

## 2015-01-30 DIAGNOSIS — R51 Headache: Secondary | ICD-10-CM | POA: Diagnosis not present

## 2015-01-30 DIAGNOSIS — R42 Dizziness and giddiness: Secondary | ICD-10-CM | POA: Insufficient documentation

## 2015-01-30 DIAGNOSIS — R531 Weakness: Secondary | ICD-10-CM | POA: Insufficient documentation

## 2015-02-01 ENCOUNTER — Encounter: Payer: Self-pay | Admitting: Family

## 2015-02-01 ENCOUNTER — Ambulatory Visit (AMBULATORY_SURGERY_CENTER): Payer: PRIVATE HEALTH INSURANCE | Admitting: Gastroenterology

## 2015-02-01 ENCOUNTER — Telehealth: Payer: Self-pay | Admitting: *Deleted

## 2015-02-01 ENCOUNTER — Encounter: Payer: Self-pay | Admitting: Gastroenterology

## 2015-02-01 VITALS — BP 127/77 | HR 85 | Temp 97.3°F | Resp 19 | Ht 65.0 in | Wt 180.0 lb

## 2015-02-01 DIAGNOSIS — D129 Benign neoplasm of anus and anal canal: Secondary | ICD-10-CM

## 2015-02-01 DIAGNOSIS — D128 Benign neoplasm of rectum: Secondary | ICD-10-CM

## 2015-02-01 DIAGNOSIS — Z86010 Personal history of colonic polyps: Secondary | ICD-10-CM

## 2015-02-01 MED ORDER — SODIUM CHLORIDE 0.9 % IV SOLN: 500.0000 mL | INTRAVENOUS | Status: DC

## 2015-02-01 NOTE — Progress Notes (Signed)
Report to PACU, RN, vss, BBS= Clear.  

## 2015-02-01 NOTE — Progress Notes (Signed)
Called to room to assist during endoscopic procedure.  Patient ID and intended procedure confirmed with present staff. Received instructions for my participation in the procedure from the performing physician.  

## 2015-02-01 NOTE — Op Note (Signed)
[facility]  [address] [place], [address]   COLONOSCOPY PROCEDURE REPORT  PATIENT: Jaime Harris, Jaime Harris  MR#: [id number] BIRTHDATE: [birth year] , 8  yrs. old GENDER: female ENDOSCOPIST: Milus Banister, MD REFERRED BZ:JIRCVEL O'Sullivan, FNP PROCEDURE DATE:  02/01/2015 PROCEDURE:   Colonoscopy, screening and Colonoscopy with snare polypectomy First Screening Colonoscopy - Avg.  risk and is 50 yrs.  old or older Yes.  Prior Negative Screening - Now for repeat screening. N/A  History of Adenoma - Now for follow-up colonoscopy & has been > or = to 3 yrs.  N/A  Polyps removed today? Yes ASA CLASS:   Class II INDICATIONS:Screening for colonic neoplasia and Colorectal Neoplasm Risk Assessment for this procedure is average risk. MEDICATIONS: Monitored anesthesia care and Propofol 160 mg IV  DESCRIPTION OF PROCEDURE:   After the risks benefits and alternatives of the procedure were thoroughly explained, informed consent was obtained.  The digital rectal exam revealed no abnormalities of the rectum.   The [id number]  endoscope was introduced through the anus and advanced to the cecum, which was identified by both the appendix and ileocecal valve. No adverse events experienced.   The quality of the prep was excellent.  The instrument was then slowly withdrawn as the colon was fully examined. Estimated blood loss is zero unless otherwise noted in this procedure report.   COLON FINDINGS: Two sessile polyps ranging between 3-61mm in size were found in the rectum.  Polypectomies were performed with a cold snare.  The resection was complete, the polyp tissue was completely retrieved and sent to histology.   There was mild diverticulosis noted in the left colon.   The examination was otherwise normal. Retroflexed views revealed no abnormalities. The time to cecum = 3.5 Withdrawal time = 11.3   The scope was withdrawn and the procedure completed. COMPLICATIONS: There  were no immediate complications.  ENDOSCOPIC IMPRESSION: 1.   Two sessile polyps ranging between 3-102mm in size were found in the rectum; polypectomies were performed with a cold snare 2.   Mild diverticulosis was noted in the left colon 3.   The examination was otherwise normal  RECOMMENDATIONS: If the polyp(s) removed today are proven to be adenomatous (pre-cancerous) polyps, you will need a repeat colonoscopy in 5 years.  Otherwise you should continue to follow colorectal cancer screening guidelines for "routine risk" patients with colonoscopy in 10 years.  You will receive a letter within 1-2 weeks with the results of your biopsy as well as final recommendations.  Please call my office if you have not received a letter after 3 weeks.  eSigned:  Milus Banister, MD 02/01/2015 3:44 PM

## 2015-02-01 NOTE — Patient Instructions (Addendum)
One of your biggest health concerns is your smoking.  This increases your risk for most cancers and serious cardiovascular diseases such as strokes, heart attacks.  You should try your best to stop.  If you need assistance, please contact your PCP or Smoking Cessation Class at [facility] [phone]) or [place] (1-800-QUIT-NOW).  Discharge instructions given. Handouts on polyps and diverticulosis. Resume previous medications. YOU HAD AN ENDOSCOPIC PROCEDURE TODAY AT [facility]:   Refer to the procedure report that was given to you for any specific questions about what was found during the examination.  If the procedure report does not answer your questions, please call your gastroenterologist to clarify.  If you requested that your care partner not be given the details of your procedure findings, then the procedure report has been included in a sealed envelope for you to review at your convenience later.  YOU SHOULD EXPECT: Some feelings of bloating in the abdomen. Passage of more gas than usual.  Walking can help get rid of the air that was put into your GI tract during the procedure and reduce the bloating. If you had a lower endoscopy (such as a colonoscopy or flexible sigmoidoscopy) you may notice spotting of blood in your stool or on the toilet paper. If you underwent a bowel prep for your procedure, you may not have a normal bowel movement for a few days.  Please Note:  You might notice some irritation and congestion in your nose or some drainage.  This is from the oxygen used during your procedure.  There is no need for concern and it should clear up in a day or so.  SYMPTOMS TO REPORT IMMEDIATELY:   Following lower endoscopy (colonoscopy or flexible sigmoidoscopy):  Excessive amounts of blood in the stool  Significant tenderness or worsening of abdominal pains  Swelling of the abdomen that is new, acute  Fever of 100F or higher  For urgent or emergent  issues, a gastroenterologist can be reached at any hour by calling [phone].   DIET: Your first meal following the procedure should be a small meal and then it is ok to progress to your normal diet. Heavy or fried foods are harder to digest and may make you feel nauseous or bloated.  Likewise, meals heavy in dairy and vegetables can increase bloating.  Drink plenty of fluids but you should avoid alcoholic beverages for 24 hours.  ACTIVITY:  You should plan to take it easy for the rest of today and you should NOT DRIVE or use heavy machinery until tomorrow (because of the sedation medicines used during the test).    FOLLOW UP: Our staff will call the number listed on your records the next business day following your procedure to check on you and address any questions or concerns that you may have regarding the information given to you following your procedure. If we do not reach you, we will leave a message.  However, if you are feeling well and you are not experiencing any problems, there is no need to return our call.  We will assume that you have returned to your regular daily activities without incident.  If any biopsies were taken you will be contacted by phone or by letter within the next 1-3 weeks.  Please call us at [phone] if you have not heard about the biopsies in 3 weeks.    SIGNATURES/CONFIDENTIALITY: You and/or your care partner have signed paperwork which will be entered into your electronic  medical record.  These signatures attest to the fact that that the information above on your After Visit Summary has been reviewed and is understood.  Full responsibility of the confidentiality of this discharge information lies with you and/or your care-partner.

## 2015-02-01 NOTE — Telephone Encounter (Signed)
-----   Message from Pieter Partridge, DO sent at 02/01/2015  8:52 AM EDT ----- MRI of brain is normal

## 2015-02-01 NOTE — Telephone Encounter (Signed)
Unable to reach patient at telephone number given

## 2015-02-02 ENCOUNTER — Telehealth: Payer: Self-pay

## 2015-02-02 NOTE — Telephone Encounter (Signed)
  Follow up Call-  Call back number 02/01/2015  Post procedure Call Back phone  # [phone]  Permission to leave phone message Yes     Patient questions:  Do you have a fever, pain , or abdominal swelling? No. Pain Score  0 *  Have you tolerated food without any problems? Yes.    Have you been able to return to your normal activities? Yes.    Do you have any questions about your discharge instructions: Diet   No. Medications  No. Follow up visit  No.  Do you have questions or concerns about your Care? No.  Actions: * If pain score is 4 or above: No action needed, pain <4.  No problems per the pt. maw

## 2015-02-03 ENCOUNTER — Encounter: Payer: Self-pay | Admitting: Family

## 2015-02-03 ENCOUNTER — Ambulatory Visit (HOSPITAL_BASED_OUTPATIENT_CLINIC_OR_DEPARTMENT_OTHER)
Admission: RE | Admit: 2015-02-03 | Discharge: 2015-02-03 | Disposition: A | Discharge status: Home / Self Care | Payer: PRIVATE HEALTH INSURANCE | Source: Ambulatory Visit | Attending: Family | Admitting: Family

## 2015-02-03 DIAGNOSIS — I071 Rheumatic tricuspid insufficiency: Secondary | ICD-10-CM | POA: Diagnosis not present

## 2015-02-03 DIAGNOSIS — I340 Nonrheumatic mitral (valve) insufficiency: Secondary | ICD-10-CM | POA: Insufficient documentation

## 2015-02-03 DIAGNOSIS — R42 Dizziness and giddiness: Secondary | ICD-10-CM

## 2015-02-03 NOTE — Progress Notes (Signed)
*  PRELIMINARY RESULTS* Echocardiogram 2D Echocardiogram has been performed.  Leavy Cella 02/03/2015, 9:43 AM

## 2015-02-09 ENCOUNTER — Encounter: Payer: Self-pay | Admitting: Gastroenterology

## 2015-03-02 ENCOUNTER — Encounter: Payer: Self-pay | Admitting: Family

## 2015-03-02 ENCOUNTER — Ambulatory Visit (INDEPENDENT_AMBULATORY_CARE_PROVIDER_SITE_OTHER): Payer: PRIVATE HEALTH INSURANCE | Admitting: Family

## 2015-03-02 VITALS — BP 122/80 | HR 81 | Temp 98.0°F | Resp 16 | Ht 64.5 in | Wt 179.4 lb

## 2015-03-02 DIAGNOSIS — G5601 Carpal tunnel syndrome, right upper limb: Secondary | ICD-10-CM | POA: Diagnosis not present

## 2015-03-02 DIAGNOSIS — R42 Dizziness and giddiness: Secondary | ICD-10-CM | POA: Diagnosis not present

## 2015-03-02 NOTE — Progress Notes (Signed)
Pre visit review using our clinic review tool, if applicable. No additional management support is needed unless otherwise documented below in the visit note. 

## 2015-03-02 NOTE — Progress Notes (Signed)
Subjective:    Patient ID: Jaime Harris, female    DOB: 1956-05-06, 59 y.o.   MRN: [id number]  HPI  Jaime Harris is a 59 yr old female who presents today with chief complaint of dizziness.  Dizziness has been present x > 1 year.  She has tried meclizine and vestibular rehab without improvement in her symptoms. MRI of the brain was performed and was unremarkable. Cortisol level WNL. 2D echo WNL.   She met with Neurology (Dr. Tomi Harris) on 5/17.  He felt that her symptoms were non-specific and not necessarily correlated with a primary neurologic etiology. He did not feel that her symptoms represented vertigo or related to migraine.    Patient reports that her dizziness is worse when she stands. Or when she looks up.  Also has associated sense that "my legs are going to fall out from under me." She denies panic attacks or nervousness.  She denies associated CP/SOB or palpitations.    She also complains of numbness/tingling in the right hand. She is right hand dominant.    Review of Systems See HPI  Past Medical History  Diagnosis Date  . Vertigo   . Thyroid disease     enlarged thyroid  . Sickle cell trait   . Vitamin D deficiency   . Hyperlipidemia     borderline  . Headache     History   Social History  . Marital Status: Single    Spouse Name: N/A  . Number of Children: N/A  . Years of Education: N/A   Occupational History  . Not on file.   Social History Main Topics  . Smoking status: Current Every Day Smoker -- 0.50 packs/day    Types: Cigarettes  . Smokeless tobacco: Never Used  . Alcohol Use: 0.0 oz/week    0 Standard drinks or equivalent per week     Comment: occasional  . Drug Use: No  . Sexual Activity: No   Other Topics Concern  . Not on file   Social History Narrative   Makes eye glasses.     Single,   GED   She has 3 children   48- Son [name]- lives locally- he has one son   77- Daughter [name]- 7 children (4 of these children live with the pt- she  is a primary care giver)   [place]- lives in [place] 1 daughter   Enjoys reading    Past Surgical History  Procedure Laterality Date  . Tubal ligation    . Foot surgery Bilateral     Pt states she had bones removed from the sides of her feet and middle toes so she could walk better.  . Tonsillectomy  1975    Family History  Problem Relation Age of Onset  . Heart disease Mother     Investment banker, corporate  . Diabetes Mother   . Heart disease Paternal Grandmother     died of CHF  . Diabetes Paternal Grandmother   . Cancer Neg Hx   . Kidney disease Neg Hx   . Colon cancer Neg Hx   . Colon polyps Neg Hx   . Rectal cancer Neg Hx   . Stomach cancer Neg Hx   . Diabetes Brother     No Known Allergies  Current Outpatient Prescriptions on File Prior to Visit  Medication Sig Dispense Refill  . ibuprofen (ADVIL,MOTRIN) 200 MG tablet Take 200 mg by mouth every 6 (six) hours as needed.    . predniSONE (DELTASONE)  10 MG tablet Take 6tabs x1day, then 5tabs x1day, then 4tabs x1day, then 3tabs x1day, then 2tabs x1day, then 1tab x1day, then STOP 21 tablet 0  . Vitamin D, Ergocalciferol, (DRISDOL) 50000 UNITS CAPS capsule Take 1 capsule (50,000 Units total) by mouth every 7 (seven) days. 12 capsule 0   No current facility-administered medications on file prior to visit.    BP 122/80 mmHg  Pulse 81  Temp(Src) 98 F (36.7 C) (Oral)  Resp 16  Ht 5' 4.5" (1.638 m)  Wt 179 lb 6.4 oz (81.375 kg)  BMI 30.33 kg/m2  SpO2 99%       Objective:   Physical Exam  Constitutional: She appears well-developed and well-nourished.  Cardiovascular: Normal rate, regular rhythm and normal heart sounds.   No murmur heard. Pulmonary/Chest: Effort normal and breath sounds normal. No respiratory distress. She has no wheezes.  Neurological:  R hand-  +Tinel + phalans  Psychiatric: She has a normal mood and affect. Her behavior is normal. Judgment and thought content normal.            Assessment & Plan:

## 2015-03-02 NOTE — Assessment & Plan Note (Signed)
She was noted to have orthostatic rise in HR from 72 to 96 bpm.  No significant BP drop was noted.  I question if she may have dizziness related to POTS. Will refer to cardiology for further evaluation and possible tilt table testing.

## 2015-03-02 NOTE — Assessment & Plan Note (Signed)
rec wrist brace and short course of NSAIDS.

## 2015-03-02 NOTE — Patient Instructions (Signed)
You will be contacted about your referral to Dr. Stanford Breed (cardiology). Apply otc wrist brace to the right wrist for carpal tunnel at night and as able during the day. You can try ibuprofen 400mg  twice daily for 1 week as well to see if this helps.  Follow up in 2 months.

## 2015-03-04 ENCOUNTER — Telehealth: Payer: Self-pay | Admitting: Family

## 2015-03-04 NOTE — Telephone Encounter (Signed)
See mychart.  

## 2015-03-29 ENCOUNTER — Telehealth: Payer: Self-pay | Admitting: Family

## 2015-03-29 ENCOUNTER — Ambulatory Visit: Payer: PRIVATE HEALTH INSURANCE | Admitting: Family

## 2015-03-29 NOTE — Telephone Encounter (Signed)
error:[id number] ° °

## 2015-03-31 ENCOUNTER — Telehealth: Payer: Self-pay | Admitting: Family

## 2015-03-31 NOTE — Telephone Encounter (Signed)
Yes please

## 2015-03-31 NOTE — Telephone Encounter (Signed)
Patient no showed 03/29/15 [facility] 04/02/15- charge or no charge

## 2015-04-02 ENCOUNTER — Ambulatory Visit (INDEPENDENT_AMBULATORY_CARE_PROVIDER_SITE_OTHER): Payer: PRIVATE HEALTH INSURANCE | Admitting: Family

## 2015-04-02 ENCOUNTER — Telehealth: Payer: Self-pay | Admitting: Family

## 2015-04-02 ENCOUNTER — Encounter: Payer: Self-pay | Admitting: Family

## 2015-04-02 VITALS — BP 120/78 | HR 88 | Temp 98.5°F | Resp 16 | Ht 64.5 in | Wt 176.8 lb

## 2015-04-02 DIAGNOSIS — E559 Vitamin D deficiency, unspecified: Secondary | ICD-10-CM

## 2015-04-02 DIAGNOSIS — R519 Headache, unspecified: Secondary | ICD-10-CM | POA: Insufficient documentation

## 2015-04-02 DIAGNOSIS — R51 Headache: Secondary | ICD-10-CM | POA: Diagnosis not present

## 2015-04-02 LAB — VITAMIN D 25 HYDROXY (VIT D DEFICIENCY, FRACTURES): VITD: 11.1 ng/mL — ABNORMAL LOW (ref 30.00–100.00)

## 2015-04-02 MED ORDER — KETOROLAC TROMETHAMINE 60 MG/2ML IM SOLN
60.0000 mg | Freq: Once | INTRAMUSCULAR | Status: AC
  Administered 2015-04-02: 60 mg via INTRAMUSCULAR

## 2015-04-02 MED ORDER — SUMATRIPTAN SUCCINATE 50 MG PO TABS: ORAL_TABLET | ORAL | Status: DC

## 2015-04-02 NOTE — Assessment & Plan Note (Signed)
Has upcoming apt with cardiology.  I am concerned re: possibility of POTS.

## 2015-04-02 NOTE — Telephone Encounter (Signed)
Vit D is still low, is she taking weekly vit d?  Recommend vit D 50000 x 3 months, repeat vid d in 3 mos dx vit d def.

## 2015-04-02 NOTE — Assessment & Plan Note (Signed)
IM toradol today in office. Neuro did not feel that her HA's were due to migraines. I have a call into Dr. Tomi Likens to discuss her case.  Advised pt that we will call her after I speak with Dr. Tomi Likens.

## 2015-04-02 NOTE — Telephone Encounter (Signed)
Spoke with Dr. Tomi Likens, he feels that her current HA symptoms could be migraine related.  He agrees ok to try a triptan. Advise pt that I will send rx for her for imitrex to try.  She should also schedule follow up with Dr. Tomi Likens.

## 2015-04-02 NOTE — Progress Notes (Signed)
Pre visit review using our clinic review tool, if applicable. No additional management support is needed unless otherwise documented below in the visit note. 

## 2015-04-02 NOTE — Progress Notes (Addendum)
Subjective:    Patient ID: Jaime Harris, female    DOB: 05-16-1956, 59 y.o.   MRN: [id number]  HPI  Ms. Jaime Harris is a 59 yr old female who presents today with chief complaint of HA. HA has been present x 1 week.  HA is associated with intermittent dizziness.  She reports that her HA is located on the left x 1 week, constant. Has tried tylenol, ibuprofen without improvement. Reports that symptoms "ease off but does not go away." Reports HA behind the left eye. Denies associated nasal congestion or fever.  She denies associated nausea. Does have some associated photophobia and phonophobia. Reports ongoing light headedness/dizziness.  Feels "kind of jittery."    Vit D deficiency- reports + compliance with vit d supplement.  Review of Systems    see HPI  Past Medical History  Diagnosis Date  . Vertigo   . Thyroid disease     enlarged thyroid  . Sickle cell trait   . Vitamin D deficiency   . Hyperlipidemia     borderline  . Headache     History   Social History  . Marital Status: Single    Spouse Name: Jaime Harris  . Number of Children: Jaime Harris  . Years of Education: Jaime Harris   Occupational History  . Not on file.   Social History Main Topics  . Smoking status: Current Every Day Smoker -- 0.50 packs/day    Types: Cigarettes  . Smokeless tobacco: Never Used  . Alcohol Use: 0.0 oz/week    0 Standard drinks or equivalent per week     Comment: occasional  . Drug Use: No  . Sexual Activity: No   Other Topics Concern  . Not on file   Social History Narrative   Makes eye glasses.     Single,   GED   She has 3 children   32- Son Jaime Harris- lives locally- he has one son   83- Daughter Jaime Harris- 7 children (4 of these children live with the pt- she is a primary care giver)   [place]- lives in [place] 1 daughter   Enjoys reading    Past Surgical History  Procedure Laterality Date  . Tubal ligation    . Foot surgery Bilateral     Pt states she had bones removed from the sides of  her feet and middle toes so she could walk better.  . Tonsillectomy  1975    Family History  Problem Relation Age of Onset  . Heart disease Mother     Investment banker, corporate  . Diabetes Mother   . Heart disease Paternal Grandmother     died of CHF  . Diabetes Paternal Grandmother   . Cancer Neg Hx   . Kidney disease Neg Hx   . Colon cancer Neg Hx   . Colon polyps Neg Hx   . Rectal cancer Neg Hx   . Stomach cancer Neg Hx   . Diabetes Brother     No Known Allergies  Current Outpatient Prescriptions on File Prior to Visit  Medication Sig Dispense Refill  . ibuprofen (ADVIL,MOTRIN) 200 MG tablet Take 200 mg by mouth every 6 (six) hours as needed.    . Vitamin D, Ergocalciferol, (DRISDOL) 50000 UNITS CAPS capsule Take 1 capsule (50,000 Units total) by mouth every 7 (seven) days. 12 capsule 0   No current facility-administered medications on file prior to visit.    BP 120/78 mmHg  Pulse 88  Temp(Src) 98.5 F (36.9 C) (Oral)  Resp 16  Ht 5' 4.5" (1.638 m)  Wt 176 lb 12.8 oz (80.196 kg)  BMI 29.89 kg/m2  SpO2 98%    Objective:   Physical Exam  Constitutional: She is oriented to person, place, and time. She appears well-developed and well-nourished.  HENT:  Head: Normocephalic and atraumatic.  Bilateral TM's occluded by cerumen  Eyes: EOM are normal. Pupils are equal, round, and reactive to light. No scleral icterus.  Cardiovascular: Normal rate, regular rhythm and normal heart sounds.   No murmur heard. Pulmonary/Chest: Effort normal and breath sounds normal. No respiratory distress. She has no wheezes.  Neurological: She is alert and oriented to person, place, and time. She exhibits normal muscle tone.  Skin: Skin is warm and dry.  Psychiatric: She has a normal mood and affect. Her behavior is normal. Judgment and thought content normal.          Assessment & Plan:  Addendum:  Spoke with Dr. Tomi Likens, he feels that her current HA symptoms could be migraine  related.  He agrees ok to try a triptan.

## 2015-04-02 NOTE — Patient Instructions (Addendum)
Please complete lab work prior to leaving. Call if headache worsens or if HA does not improve.   We will call you with further recommendations after we discuss your care with Dr. Tomi Likens.

## 2015-04-05 MED ORDER — VITAMIN D (ERGOCALCIFEROL) 1.25 MG (50000 UNIT) PO CAPS: 50000.0000 [IU] | ORAL_CAPSULE | ORAL | Status: DC

## 2015-04-05 NOTE — Telephone Encounter (Signed)
Left detailed message on pt's cell# and to call with response.

## 2015-04-05 NOTE — Telephone Encounter (Signed)
Spoke with pt. She has not tried imitrex yet. Advised pt to try imitrex. If this does not help her headache, then I recommend she contact Dr. Tomi Likens to arrange sooner follow up.  She verbalizes understanding.

## 2015-04-05 NOTE — Telephone Encounter (Signed)
Notified pt.  She wants to let PCP know that her headaches are not better. Had headache all day yesterday. Had to "hold on to things for a minute while her dizziness passed". Noted episodes of blurred vision and could hear heart beat in her ears loudly.  Pt denied confusion or extremity / facial weakness.  Please advise?

## 2015-04-28 ENCOUNTER — Ambulatory Visit (INDEPENDENT_AMBULATORY_CARE_PROVIDER_SITE_OTHER): Payer: PRIVATE HEALTH INSURANCE | Admitting: Cardiology

## 2015-04-28 ENCOUNTER — Encounter: Payer: Self-pay | Admitting: Cardiology

## 2015-04-28 VITALS — BP 136/89 | HR 89 | Ht 65.0 in | Wt 174.1 lb

## 2015-04-28 DIAGNOSIS — Z720 Tobacco use: Secondary | ICD-10-CM

## 2015-04-28 DIAGNOSIS — R42 Dizziness and giddiness: Secondary | ICD-10-CM | POA: Diagnosis not present

## 2015-04-28 NOTE — Assessment & Plan Note (Addendum)
Etiology unclear. Orthostatic vitals were checked in the office today. Her heart rate in the lying position was 88 and in the sitting position 98. 10 minutes after standing 90. Blood pressure did not show orthostatic changes. Thus vitals not consistent with POTS. EKG unremarkable other than left ventricular hypertrophy. Previous echocardiogram showed normal LV function. I do not think symptoms are likely cardiac related. She will follow up with neurology for further assessment. Although vitals are not consistent with orthostasis have asked her to increase her sodium and fluid intake to see if this helps.

## 2015-04-28 NOTE — Assessment & Plan Note (Signed)
Patient counseled on discontinuing. 

## 2015-04-28 NOTE — Progress Notes (Signed)
HPI: 59 year old female for evaluation of dizziness. Echocardiogram May 2016 showed normal LV function. There was mild mitral regurgitation and trace tricuspid regurgitation. Brain MRI May 2016 normal. Cortisol level 5.3. Seen by neurology in May 2016 and symptoms felt not consistent with vertigo. Patient states she has had intermittent dizziness for approximately 3 years. Over the past 9 months it has worsened. She has 4-5 episodes daily. These can occur either in the lying, sitting or standing position. She feels dizzy for approximately 1 minute and then symptoms resolve. There is associated headaches. There is no dyspnea, palpitations or chest pain. She occasionally has some dizziness with standing but this is not predictable. Otherwise dyspnea with more moderate activities but not routine activities. No orthopnea, PND, pedal edema or exertional chest pain. No syncope.  Current Outpatient Prescriptions  Medication Sig Dispense Refill  . ibuprofen (ADVIL,MOTRIN) 200 MG tablet Take 200 mg by mouth every 6 (six) hours as needed (pain).     . SUMAtriptan (IMITREX) 50 MG tablet May repeat in 2 hours if headache persists or recurs. Max 2 tabs/24 hrs 10 tablet 0  . Vitamin D, Ergocalciferol, (DRISDOL) 50000 UNITS CAPS capsule Take 1 capsule (50,000 Units total) by mouth every 7 (seven) days. 12 capsule 0   No current facility-administered medications for this visit.    No Known Allergies   Past Medical History  Diagnosis Date  . Vertigo   . Thyroid disease     enlarged thyroid  . Sickle cell trait   . Vitamin D deficiency   . Hyperlipidemia     borderline  . Headache     Past Surgical History  Procedure Laterality Date  . Tubal ligation    . Foot surgery Bilateral     Pt states she had bones removed from the sides of her feet and middle toes so she could walk better.  . Tonsillectomy  1975    Social History   Social History  . Marital Status: Single    Spouse Name: N/A  .  Number of Children: 3  . Years of Education: N/A   Occupational History  .      Cashier   Social History Main Topics  . Smoking status: Current Every Day Smoker -- 0.50 packs/day    Types: Cigarettes  . Smokeless tobacco: Never Used  . Alcohol Use: 0.0 oz/week    0 Standard drinks or equivalent per week     Comment: occasional  . Drug Use: No  . Sexual Activity: No   Other Topics Concern  . Not on file   Social History Narrative   Makes eye glasses.     Single,   GED   She has 3 children   78- Son [name]- lives locally- he has one son   61- Daughter [name]- 7 children (4 of these children live with the pt- she is a primary care giver)   1985- michael- lives in [place] 1 daughter   Enjoys reading    Family History  Problem Relation Age of Onset  . Heart disease Mother     Investment banker, corporate  . Diabetes Mother   . Heart disease Paternal Grandmother     died of CHF  . Diabetes Paternal Grandmother   . Cancer Neg Hx   . Kidney disease Neg Hx   . Colon cancer Neg Hx   . Colon polyps Neg Hx   . Rectal cancer Neg Hx   . Stomach cancer Neg Hx   .  Diabetes Brother     ROS: no fevers or chills, productive cough, hemoptysis, dysphasia, odynophagia, melena, hematochezia, dysuria, hematuria, rash, seizure activity, orthopnea, PND, pedal edema, claudication. Remaining systems are negative.  Physical Exam:   Blood pressure 136/89, pulse 89, height 5\' 5"  (1.651 m), weight 174 lb 1.3 oz (78.962 kg).  General:  Well developed/well nourished in NAD Skin warm/dry Patient not depressed No peripheral clubbing Back-normal HEENT-normal/normal eyelids Neck supple/normal carotid upstroke bilaterally; no bruits; no JVD; no thyromegaly chest - CTA/ normal expansion CV - RRR/normal S1 and S2; no murmurs, rubs or gallops;  PMI nondisplaced Abdomen -NT/ND, no HSM, no mass, + bowel sounds, no bruit 2+ femoral pulses, no bruits Ext-no edema, chords, 2+ DP Neuro-grossly  nonfocal  ECG 01/19/2015-sinus rhythm, left axis deviation, no ST changes.  ECG today-NSR, LVH

## 2015-04-28 NOTE — Patient Instructions (Signed)
Your physician recommends that you schedule a follow-up appointment in: AS NEEDED  

## 2015-05-12 ENCOUNTER — Ambulatory Visit: Payer: PRIVATE HEALTH INSURANCE | Admitting: Neurology

## 2015-05-18 ENCOUNTER — Encounter: Payer: Self-pay | Admitting: *Deleted

## 2015-05-18 NOTE — Progress Notes (Signed)
No show letter sent for missed appointment on 05/12/2015

## 2015-06-29 ENCOUNTER — Other Ambulatory Visit: Payer: PRIVATE HEALTH INSURANCE

## 2015-07-27 ENCOUNTER — Emergency Department (HOSPITAL_BASED_OUTPATIENT_CLINIC_OR_DEPARTMENT_OTHER)
Admission: EM | Admit: 2015-07-27 | Discharge: 2015-07-27 | Disposition: A | Discharge status: Home / Self Care | Payer: Self-pay | Attending: Emergency Medicine | Admitting: Emergency Medicine

## 2015-07-27 ENCOUNTER — Emergency Department (HOSPITAL_BASED_OUTPATIENT_CLINIC_OR_DEPARTMENT_OTHER): Payer: Self-pay

## 2015-07-27 ENCOUNTER — Encounter (HOSPITAL_BASED_OUTPATIENT_CLINIC_OR_DEPARTMENT_OTHER): Payer: Self-pay

## 2015-07-27 DIAGNOSIS — Z862 Personal history of diseases of the blood and blood-forming organs and certain disorders involving the immune mechanism: Secondary | ICD-10-CM | POA: Insufficient documentation

## 2015-07-27 DIAGNOSIS — M654 Radial styloid tenosynovitis [de Quervain]: Secondary | ICD-10-CM | POA: Insufficient documentation

## 2015-07-27 DIAGNOSIS — F17210 Nicotine dependence, cigarettes, uncomplicated: Secondary | ICD-10-CM | POA: Insufficient documentation

## 2015-07-27 DIAGNOSIS — E559 Vitamin D deficiency, unspecified: Secondary | ICD-10-CM | POA: Insufficient documentation

## 2015-07-27 DIAGNOSIS — M779 Enthesopathy, unspecified: Secondary | ICD-10-CM | POA: Insufficient documentation

## 2015-07-27 MED ORDER — MELOXICAM 7.5 MG PO TABS
7.5000 mg | ORAL_TABLET | Freq: Every day | ORAL | Status: DC
Start: 2015-07-27 — End: 2015-10-01

## 2015-07-27 NOTE — ED Notes (Signed)
C/o "knot' to right wrist with pain to wrist and hand x 3days-denies injury-NAD

## 2015-07-27 NOTE — ED Notes (Signed)
Velcro thumb spica splint applied per ED tech.

## 2015-07-27 NOTE — ED Notes (Signed)
Assessment completed. Ice pack applied.

## 2015-07-27 NOTE — ED Provider Notes (Signed)
CSN: [id number]     Arrival date & time 07/27/15  1200 History   First MD Initiated Contact with Patient 07/27/15 1252     Chief Complaint  Patient presents with  . Wrist Pain     (Consider location/radiation/quality/duration/timing/severity/associated sxs/prior Treatment) HPI   Gradual onset right wrist pain and right thumb pain that began 3 days ago Described as burning Constant, moderate pain Made worse with grabbing objects and gripping things.  Worse at night Tried Aleve and had some relief Assoc sxs include weakness and mild swelling Denies numbness/tingling or color change  Past Medical History  Diagnosis Date  . Vertigo   . Thyroid disease     enlarged thyroid  . Sickle cell trait ([place])   . Vitamin D deficiency   . Hyperlipidemia     borderline  . Headache    Past Surgical History  Procedure Laterality Date  . Tubal ligation    . Foot surgery Bilateral     Pt states she had bones removed from the sides of her feet and middle toes so she could walk better.  . Tonsillectomy  1975   Family History  Problem Relation Age of Onset  . Heart disease Mother     Investment banker, corporate  . Diabetes Mother   . Heart disease Paternal Grandmother     died of CHF  . Diabetes Paternal Grandmother   . Cancer Neg Hx   . Kidney disease Neg Hx   . Colon cancer Neg Hx   . Colon polyps Neg Hx   . Rectal cancer Neg Hx   . Stomach cancer Neg Hx   . Diabetes Brother    Social History  Substance Use Topics  . Smoking status: Current Every Day Smoker -- 0.50 packs/day    Types: Cigarettes  . Smokeless tobacco: Never Used  . Alcohol Use: Yes     Comment: occasional   OB History    No data available     Review of Systems All other systems negative unless otherwise stated in HPI    Allergies  Review of patient's allergies indicates no known allergies.  Home Medications   Prior to Admission medications   Medication Sig Start Date End Date Taking? Authorizing  Provider  ibuprofen (ADVIL,MOTRIN) 200 MG tablet Take 200 mg by mouth every 6 (six) hours as needed (pain).     Historical Provider, MD  meloxicam (MOBIC) 7.5 MG tablet Take 1 tablet (7.5 mg total) by mouth daily. 07/27/15   Gloriann Loan, PA-C  Vitamin D, Ergocalciferol, (DRISDOL) 50000 UNITS CAPS capsule Take 1 capsule (50,000 Units total) by mouth every 7 (seven) days. 04/05/15   Debbrah Alar, NP   BP 138/80 mmHg  Pulse 95  Temp(Src) 98 F (36.7 C) (Oral)  Resp 18  Ht 5\' 5"  (1.651 m)  Wt 175 lb (79.379 kg)  BMI 29.12 kg/m2  SpO2 99% Physical Exam  Constitutional: She is oriented to person, place, and time. She appears well-developed and well-nourished.  HENT:  Head: Normocephalic and atraumatic.  Mouth/Throat: Oropharynx is clear and moist.  Neck: Normal range of motion. Neck supple.  Cardiovascular: Normal rate, regular rhythm and normal heart sounds.   No murmur heard. Pulses:      Radial pulses are 2+ on the right side, and 2+ on the left side.  Pulmonary/Chest: Effort normal and breath sounds normal. No accessory muscle usage. No respiratory distress. She has no wheezes. She has no rhonchi. She has no rales.  Abdominal: Soft. Bowel  sounds are normal. She exhibits no distension. There is no tenderness.  Musculoskeletal: Normal range of motion.  Mild swelling on radial side of wrist at base of thumb.  No erythema, warmth, induration, or fluctuance.  Mild TTP along base of thumb and dorsal aspect of thumb.  Positive Finkelstein test.  Negative Phalen test.   Neurological: She is alert and oriented to person, place, and time.  Speech clear without dysarthria.  Strength intact bilaterally in elbow, wrist, fingers, and thumbs bilaterally.  Sensation intact bilaterally in upper and lower extremities.   Skin: Skin is warm and dry.  Psychiatric: She has a normal mood and affect. Her behavior is normal.    ED Course  Procedures (including critical care time) Labs Review Labs  Reviewed - No data to display  Imaging Review Dg Finger Thumb Right  07/27/2015  CLINICAL DATA:  Pain and swelling around knee MCP joint of the right thumb for 3-4 days, no injury. EXAM: RIGHT THUMB 2+V COMPARISON:  None. FINDINGS: There is no evidence of fracture or dislocation. There is no evidence of arthropathy or other focal bone abnormality. Soft tissues are unremarkable IMPRESSION: Negative. Electronically Signed   By: Van Clines M.D.   On: 07/27/2015 13:00   I have personally reviewed and evaluated these images and lab results as part of my medical decision-making.   EKG Interpretation None      MDM   Final diagnoses:  Tendinitis, de Quervain's    Suspect de Quervain tendinopathy.  Neurovascularly intact.  Will treat with thumb spica splint and Mobic.  Plain films of right thumb show no evidence of fracture or dislocation.   Evaluation does not show pathology requring ongoing emergent intervention or admission. Pt is hemodynamically stable and mentating appropriately. Discussed findings/results and plan with patient/guardian, who agrees with plan. All questions answered. Return precautions discussed and outpatient follow up given.      Gloriann Loan, PA-C 07/27/15 2141  Tanna Furry, MD 08/05/15 [phone]

## 2015-07-27 NOTE — Discharge Instructions (Signed)
De Quervain Tenosynovitis °Tendons attach muscles to bones. They also help with joint movements. When tendons become irritated or swollen, it is called tendinitis. °The extensor pollicis brevis (EPB) tendon connects the EPB muscle to a bone that is near the base of the thumb. The EPB muscle helps to straighten and extend the thumb. De Quervain tenosynovitis is a condition in which the EPB tendon lining (sheath) becomes irritated, thickened, and swollen. This condition is sometimes called stenosing tenosynovitis. This condition causes pain on the thumb side of the back of the wrist. °CAUSES °Causes of this condition include: °· Activities that repeatedly cause your thumb and wrist to extend. °· A sudden increase in activity or change in activity that affects your wrist. °RISK FACTORS: °This condition is more likely to develop in: °· Females. °· People who have diabetes. °· Women who have recently given birth. °· People who are over 40 years of age. °· People who do activities that involve repeated hand and wrist motions, such as tennis, racquetball, volleyball, gardening, and taking care of children. °· People who do heavy labor. °· People who have poor wrist strength and flexibility. °· People who do not warm up properly before activities. °SYMPTOMS °Symptoms of this condition include: °· Pain or tenderness over the thumb side of the back of the wrist when your thumb and wrist are not moving. °· Pain that gets worse when you straighten your thumb or extend your thumb or wrist. °· Pain when the injured area is touched. °· Locking or catching of the thumb joint while you bend and straighten your thumb. °· Decreased thumb motion due to pain. °· Swelling over the affected area. °DIAGNOSIS °This condition is diagnosed with a medical history and physical exam. Your health care provider will ask for details about your injury and ask about your symptoms. °TREATMENT °Treatment may include the use of icing and medicines to  reduce pain and swelling. You may also be advised to wear a splint or brace to limit your thumb and wrist motion. In less severe cases, treatment may also include working with a physical therapist to strengthen your wrist and calm the irritation around your EPB tendon sheath. In severe cases, surgery may be needed. °HOME CARE INSTRUCTIONS °If You Have a Splint or Brace: °· Wear it as told by your health care provider. Remove it only as told by your health care provider. °· Loosen the splint or brace if your fingers become numb and tingle, or if they turn cold and blue. °· Keep the splint or brace clean and dry. °Managing Pain, Stiffness, and Swelling  °· If directed, apply ice to the injured area. °¨ Put ice in a plastic bag. °¨ Place a towel between your skin and the bag. °¨ Leave the ice on for 20 minutes, 2-3 times per day. °· Move your fingers often to avoid stiffness and to lessen swelling. °· Raise (elevate) the injured area above the level of your heart while you are sitting or lying down. °General Instructions °· Return to your normal activities as told by your health care provider. Ask your health care provider what activities are safe for you. °· Take over-the-counter and prescription medicines only as told by your health care provider. °· Keep all follow-up visits as told by your health care provider. This is important. °· Do not drive or operate heavy machinery while taking prescription pain medicine. °SEEK MEDICAL CARE IF: °· Your pain, tenderness, or swelling gets worse, even if you have had   treatment. °· You have numbness or tingling in your wrist, hand, or fingers on the injured side. °  °This information is not intended to replace advice given to you by your health care provider. Make sure you discuss any questions you have with your health care provider. °  °Document Released: 08/28/2005 Document Revised: 05/19/2015 Document Reviewed: 11/03/2014 °Elsevier Interactive Patient Education ©2016  [employer] ° °

## 2015-08-11 ENCOUNTER — Encounter: Payer: Self-pay | Admitting: Family

## 2015-08-11 ENCOUNTER — Ambulatory Visit (INDEPENDENT_AMBULATORY_CARE_PROVIDER_SITE_OTHER): Payer: PRIVATE HEALTH INSURANCE | Admitting: Family

## 2015-08-11 VITALS — BP 133/83 | HR 98 | Temp 98.3°F | Resp 18 | Ht 64.5 in | Wt 175.2 lb

## 2015-08-11 DIAGNOSIS — Z0000 Encounter for general adult medical examination without abnormal findings: Secondary | ICD-10-CM

## 2015-08-11 DIAGNOSIS — M79644 Pain in right finger(s): Secondary | ICD-10-CM

## 2015-08-11 NOTE — Patient Instructions (Addendum)
Continue brace. You will be contacted about your referral to the hand specialist.

## 2015-08-11 NOTE — Progress Notes (Signed)
Subjective:    Patient ID: Jaime Harris, female    DOB: 05/02/1956, 59 y.o.   MRN: [id number]  HPI  Ms. Jaime Harris is a 59 yr old female who presents today with chief complaint of pain right thumb. Pain began 2 weeks ago. Denies injury. Went to ED 11/15 and was given NSAID and brace without improvement.   ED record is reviewed.  Review of Systems    see HPI  Past Medical History  Diagnosis Date  . Vertigo   . Thyroid disease     enlarged thyroid  . Sickle cell trait ([place])   . Vitamin D deficiency   . Hyperlipidemia     borderline  . Headache     Social History   Social History  . Marital Status: Single    Spouse Name: N/A  . Number of Children: 3  . Years of Education: N/A   Occupational History  .      Cashier   Social History Main Topics  . Smoking status: Current Every Day Smoker -- 0.50 packs/day    Types: Cigarettes  . Smokeless tobacco: Never Used  . Alcohol Use: Yes     Comment: occasional  . Drug Use: No  . Sexual Activity: Not on file   Other Topics Concern  . Not on file   Social History Narrative   Makes eye glasses.     Single,   GED   She has 3 children   68- Son Jaime Harris- lives locally- he has one son   6- Daughter Jaime Harris- 7 children (4 of these children live with the pt- she is a primary care giver)   [place]- lives in [place] 1 daughter   Enjoys reading    Past Surgical History  Procedure Laterality Date  . Tubal ligation    . Foot surgery Bilateral     Pt states she had bones removed from the sides of her feet and middle toes so she could walk better.  . Tonsillectomy  1975    Family History  Problem Relation Age of Onset  . Heart disease Mother     Investment banker, corporate  . Diabetes Mother   . Heart disease Paternal Grandmother     died of CHF  . Diabetes Paternal Grandmother   . Cancer Neg Hx   . Kidney disease Neg Hx   . Colon cancer Neg Hx   . Colon polyps Neg Hx   . Rectal cancer Neg Hx   . Stomach cancer  Neg Hx   . Diabetes Brother     No Known Allergies  Current Outpatient Prescriptions on File Prior to Visit  Medication Sig Dispense Refill  . ibuprofen (ADVIL,MOTRIN) 200 MG tablet Take 200 mg by mouth every 6 (six) hours as needed (pain).     . meloxicam (MOBIC) 7.5 MG tablet Take 1 tablet (7.5 mg total) by mouth daily. 14 tablet 0  . Vitamin D, Ergocalciferol, (DRISDOL) 50000 UNITS CAPS capsule Take 1 capsule (50,000 Units total) by mouth every 7 (seven) days. 12 capsule 0   No current facility-administered medications on file prior to visit.    BP 133/83 mmHg  Pulse 98  Temp(Src) 98.3 F (36.8 C) (Oral)  Resp 18  Ht 5' 4.5" (1.638 m)  Wt 175 lb 3.2 oz (79.47 kg)  BMI 29.62 kg/m2  SpO2 99%    Objective:   Physical Exam  Constitutional: She appears well-developed and well-nourished.  Cardiovascular: Normal rate, regular rhythm and normal heart  sounds.   No murmur heard. Pulmonary/Chest: Effort normal and breath sounds normal. No respiratory distress. She has no wheezes.  Musculoskeletal:  + pain/tenderness at base of right thumb. No swelling, no erythema  Psychiatric: She has a normal mood and affect. Her behavior is normal. Judgment and thought content normal.          Assessment & Plan:  Thumb pain- no improvement so far with nsaids/brace- refer to hand specialist for further evaluation.

## 2015-08-11 NOTE — Progress Notes (Signed)
Pre visit review using our clinic review tool, if applicable. No additional management support is needed unless otherwise documented below in the visit note. 

## 2015-09-28 ENCOUNTER — Telehealth: Payer: Self-pay | Admitting: Family

## 2015-09-28 ENCOUNTER — Ambulatory Visit: Payer: PRIVATE HEALTH INSURANCE | Admitting: Family

## 2015-09-28 NOTE — Telephone Encounter (Signed)
Noted. No charge please.  

## 2015-09-28 NOTE — Telephone Encounter (Signed)
Caller name: [facility]  Relationship to patient: Self  Can be reached: [phone]  Reason for call: Pt lvm at 8:00 stating that she wasn't going to make it to her 9:45 appt due to her having to work. We rescheduled pt's appt for Friday at 8:15 instead.

## 2015-10-01 ENCOUNTER — Ambulatory Visit (INDEPENDENT_AMBULATORY_CARE_PROVIDER_SITE_OTHER): Payer: PRIVATE HEALTH INSURANCE | Admitting: Family

## 2015-10-01 ENCOUNTER — Encounter: Payer: Self-pay | Admitting: Family

## 2015-10-01 VITALS — BP 132/79 | HR 86 | Temp 98.3°F | Resp 18 | Ht 65.0 in | Wt 178.2 lb

## 2015-10-01 DIAGNOSIS — H9203 Otalgia, bilateral: Secondary | ICD-10-CM

## 2015-10-01 DIAGNOSIS — R109 Unspecified abdominal pain: Secondary | ICD-10-CM

## 2015-10-01 LAB — COMPREHENSIVE METABOLIC PANEL
ALBUMIN: 4.2 g/dL (ref 3.6–5.1)
ALT: 11 U/L (ref 6–29)
AST: 13 U/L (ref 10–35)
Alkaline Phosphatase: 162 U/L — ABNORMAL HIGH (ref 33–130)
BILIRUBIN TOTAL: 0.3 mg/dL (ref 0.2–1.2)
BUN: 13 mg/dL (ref 7–25)
CALCIUM: 9.7 mg/dL (ref 8.6–10.4)
CO2: 29 mmol/L (ref 20–31)
CREATININE: 0.93 mg/dL (ref 0.50–1.05)
Chloride: 103 mmol/L (ref 98–110)
GLUCOSE: 94 mg/dL (ref 65–99)
Potassium: 4.7 mmol/L (ref 3.5–5.3)
SODIUM: 140 mmol/L (ref 135–146)
Total Protein: 7 g/dL (ref 6.1–8.1)

## 2015-10-01 LAB — CBC WITH DIFFERENTIAL/PLATELET
BASOS PCT: 0 % (ref 0–1)
Basophils Absolute: 0 10*3/uL (ref 0.0–0.1)
EOS ABS: 0.2 10*3/uL (ref 0.0–0.7)
EOS PCT: 3 % (ref 0–5)
HCT: 40.7 % (ref 36.0–46.0)
Hemoglobin: 13.6 g/dL (ref 12.0–15.0)
Lymphocytes Relative: 57 % — ABNORMAL HIGH (ref 12–46)
Lymphs Abs: 3.5 10*3/uL (ref 0.7–4.0)
MCH: 29.1 pg (ref 26.0–34.0)
MCHC: 33.4 g/dL (ref 30.0–36.0)
MCV: 87 fL (ref 78.0–100.0)
MONOS PCT: 6 % (ref 3–12)
MPV: 11 fL (ref 8.6–12.4)
Monocytes Absolute: 0.4 10*3/uL (ref 0.1–1.0)
NEUTROS PCT: 34 % — AB (ref 43–77)
Neutro Abs: 2.1 10*3/uL (ref 1.7–7.7)
PLATELETS: 231 10*3/uL (ref 150–400)
RBC: 4.68 MIL/uL (ref 3.87–5.11)
RDW: 14.8 % (ref 11.5–15.5)
WBC: 6.2 10*3/uL (ref 4.0–10.5)

## 2015-10-01 LAB — LIPASE: LIPASE: 17 U/L (ref 7–60)

## 2015-10-01 NOTE — Patient Instructions (Addendum)
Please complete lab work prior to leaving. We will contact you about scheduling your CT scan. Go to ER if you develop severe/worsening pain. Try adding claritin 10mg  once daily and flonase 2 sprays each nostril once daily to see if this helps with your ear fullness/headaches. For constipation- make sure you are drinking 6-8 glasses of water each day and that you eat a fresh fruit/veggie with each meal. You may use miralax as needed.  Titrate miralax to make sure you have 1 soft stool each day. Try starting with 1 tablespoon mixed in water/juice each day.

## 2015-10-01 NOTE — Progress Notes (Signed)
Subjective:    Patient ID: Jaime Harris, female    DOB: [birth year], 60 y.o.   MRN: [id number]  HPI  Jaime Harris is a 60 yr old female who presents today with chief complaint of abdominal discomfort.  Discomfort is epigastric. Discomfort began about 2 weeks ago. + associated constipation.  Pain is intermittent.  + BM yesterday,prior to that had BM on Tuesday. Feels like her food just "sits in stomach."    Otalgia- reports ears feel full, has intermittent sharp pain right ear.  + HA- located "behind my eyes." has occasional associated dizziness.  This occurs when she tilts her head back.  Has some pain in her neck with flexion.  Reports sound is Muffled. Reports mild rhinorrhea.    Review of Systems    see HPI  Past Medical History  Diagnosis Date  . Vertigo   . Thyroid disease     enlarged thyroid  . Sickle cell trait ([place])   . Vitamin D deficiency   . Hyperlipidemia     borderline  . Headache     Social History   Social History  . Marital Status: Single    Spouse Name: N/A  . Number of Children: 3  . Years of Education: N/A   Occupational History  .      Cashier   Social History Main Topics  . Smoking status: Current Every Day Smoker -- 0.50 packs/day    Types: Cigarettes  . Smokeless tobacco: Never Used  . Alcohol Use: Yes     Comment: occasional  . Drug Use: No  . Sexual Activity: Not on file   Other Topics Concern  . Not on file   Social History Narrative   Makes eye glasses.     Single,   GED   She has 3 children   53- Son [name]- lives locally- he has one son   12- Daughter [name]- 7 children (4 of these children live with the pt- she is a primary care giver)   [place]- lives in [place] 1 daughter   Enjoys reading    Past Surgical History  Procedure Laterality Date  . Tubal ligation    . Foot surgery Bilateral     Pt states she had bones removed from the sides of her feet and middle toes so she could walk better.  . Tonsillectomy   1975    Family History  Problem Relation Age of Onset  . Heart disease Mother     Investment banker, corporate  . Diabetes Mother   . Heart disease Paternal Grandmother     died of CHF  . Diabetes Paternal Grandmother   . Cancer Neg Hx   . Kidney disease Neg Hx   . Colon cancer Neg Hx   . Colon polyps Neg Hx   . Rectal cancer Neg Hx   . Stomach cancer Neg Hx   . Diabetes Brother     No Known Allergies  Current Outpatient Prescriptions on File Prior to Visit  Medication Sig Dispense Refill  . ibuprofen (ADVIL,MOTRIN) 200 MG tablet Take 200 mg by mouth every 6 (six) hours as needed (pain).     . Vitamin D, Ergocalciferol, (DRISDOL) 50000 UNITS CAPS capsule Take 1 capsule (50,000 Units total) by mouth every 7 (seven) days. 12 capsule 0   No current facility-administered medications on file prior to visit.    BP 132/79 mmHg  Pulse 86  Temp(Src) 98.3 F (36.8 C) (Oral)  Resp 18  Ht  5\' 5"  (1.651 m)  Wt 178 lb 3.2 oz (80.831 kg)  BMI 29.65 kg/m2  SpO2 100%    Objective:   Physical Exam  Constitutional: She is oriented to person, place, and time. She appears well-developed and well-nourished.  HENT:  Nose: Right sinus exhibits no maxillary sinus tenderness and no frontal sinus tenderness. Left sinus exhibits no maxillary sinus tenderness and no frontal sinus tenderness.  Mouth/Throat: No oropharyngeal exudate, posterior oropharyngeal edema or posterior oropharyngeal erythema.  Some clear fluid noted behind bilateral TM's without erythema or bulging  Cardiovascular: Normal rate, regular rhythm and normal heart sounds.   No murmur heard. Pulmonary/Chest: Effort normal and breath sounds normal. No respiratory distress. She has no wheezes.  Abdominal:  Mild abdominal distention,  + hyperactive bowel sounds.  No palpable hernia  Musculoskeletal: She exhibits no edema.  Neurological: She is alert and oriented to person, place, and time.  Psychiatric: She has a normal mood and  affect. Her behavior is normal. Judgment and thought content normal.          Assessment & Plan:  Abdominal pain-  Could be due to constipation, but she also describes some epigastric bulging which is intermittent and I am concerned about possibility of ventral hernia.  Will obtain CT abd/pelvis to further evaluate. Will also obtain lab work as below to further evaluate.  To treat constipation, I have advised her as follows:   For constipation- make sure you are drinking 6-8 glasses of water each day and that you eat a fresh fruit/veggie with each meal. You may use miralax as needed.  Titrate miralax to make sure you have 1 soft stool each day. Try starting with 1 tablespoon mixed in water/juice each day.    Otalgia- advised patient to  Try adding claritin 10mg  once daily and flonase 2 sprays each nostril once daily to see if this helps with your ear fullness/headaches.

## 2015-10-01 NOTE — Progress Notes (Signed)
Pre visit review using our clinic review tool, if applicable. No additional management support is needed unless otherwise documented below in the visit note. 

## 2015-10-02 ENCOUNTER — Ambulatory Visit (HOSPITAL_BASED_OUTPATIENT_CLINIC_OR_DEPARTMENT_OTHER)
Admission: RE | Admit: 2015-10-02 | Discharge: 2015-10-02 | Disposition: A | Discharge status: Home / Self Care | Payer: PRIVATE HEALTH INSURANCE | Source: Ambulatory Visit | Attending: Family | Admitting: Family

## 2015-10-02 ENCOUNTER — Encounter (HOSPITAL_BASED_OUTPATIENT_CLINIC_OR_DEPARTMENT_OTHER): Payer: Self-pay

## 2015-10-02 DIAGNOSIS — R1010 Upper abdominal pain, unspecified: Secondary | ICD-10-CM | POA: Insufficient documentation

## 2015-10-02 DIAGNOSIS — K5900 Constipation, unspecified: Secondary | ICD-10-CM | POA: Insufficient documentation

## 2015-10-02 DIAGNOSIS — R109 Unspecified abdominal pain: Secondary | ICD-10-CM

## 2015-10-02 DIAGNOSIS — R140 Abdominal distension (gaseous): Secondary | ICD-10-CM | POA: Insufficient documentation

## 2015-10-02 MED ORDER — IOHEXOL 300 MG/ML  SOLN
100.0000 mL | Freq: Once | INTRAMUSCULAR | Status: AC | PRN
  Administered 2015-10-02: 100 mL via INTRAVENOUS

## 2015-10-02 MED ORDER — IOHEXOL 300 MG/ML  SOLN: 50.0000 mL | Freq: Once | INTRAMUSCULAR | Status: DC | PRN

## 2015-10-03 ENCOUNTER — Telehealth: Payer: Self-pay | Admitting: Family

## 2015-10-03 MED ORDER — OMEPRAZOLE 40 MG PO CPDR: 40.0000 mg | DELAYED_RELEASE_CAPSULE | Freq: Every day | ORAL | Status: DC

## 2015-10-03 NOTE — Telephone Encounter (Signed)
CT looks good, labs stable.  No hernia on CT.  No obvious constipation on CT.  I would recommend that she add omeprazole once daily.  rx pended. Call if symptoms worsen or if not improved in 2 weeks

## 2015-10-04 NOTE — Telephone Encounter (Signed)
Left detailed message on pt's cell# and to call if any questions. 

## 2015-10-26 ENCOUNTER — Ambulatory Visit: Payer: PRIVATE HEALTH INSURANCE | Admitting: Family

## 2015-10-26 ENCOUNTER — Telehealth: Payer: Self-pay | Admitting: Family

## 2015-10-27 ENCOUNTER — Encounter (HOSPITAL_BASED_OUTPATIENT_CLINIC_OR_DEPARTMENT_OTHER): Payer: Self-pay | Admitting: Emergency Medicine

## 2015-10-27 DIAGNOSIS — E559 Vitamin D deficiency, unspecified: Secondary | ICD-10-CM | POA: Insufficient documentation

## 2015-10-27 DIAGNOSIS — F17210 Nicotine dependence, cigarettes, uncomplicated: Secondary | ICD-10-CM | POA: Insufficient documentation

## 2015-10-27 DIAGNOSIS — Z79899 Other long term (current) drug therapy: Secondary | ICD-10-CM | POA: Diagnosis not present

## 2015-10-27 DIAGNOSIS — R110 Nausea: Secondary | ICD-10-CM | POA: Diagnosis not present

## 2015-10-27 DIAGNOSIS — R51 Headache: Secondary | ICD-10-CM | POA: Diagnosis present

## 2015-10-27 DIAGNOSIS — Z862 Personal history of diseases of the blood and blood-forming organs and certain disorders involving the immune mechanism: Secondary | ICD-10-CM | POA: Diagnosis not present

## 2015-10-27 DIAGNOSIS — R42 Dizziness and giddiness: Secondary | ICD-10-CM | POA: Diagnosis not present

## 2015-10-27 NOTE — ED Notes (Signed)
Patient states that she has a severe Headache with Dizziness and chest pounding. Reports that she had some chest pain earlier today.

## 2015-10-28 ENCOUNTER — Emergency Department (HOSPITAL_BASED_OUTPATIENT_CLINIC_OR_DEPARTMENT_OTHER)
Admission: EM | Admit: 2015-10-28 | Discharge: 2015-10-28 | Disposition: A | Discharge status: Home / Self Care | Payer: PRIVATE HEALTH INSURANCE | Attending: Emergency Medicine | Admitting: Emergency Medicine

## 2015-10-28 ENCOUNTER — Encounter (HOSPITAL_BASED_OUTPATIENT_CLINIC_OR_DEPARTMENT_OTHER): Payer: Self-pay | Admitting: Emergency Medicine

## 2015-10-28 DIAGNOSIS — R519 Headache, unspecified: Secondary | ICD-10-CM

## 2015-10-28 DIAGNOSIS — R51 Headache: Secondary | ICD-10-CM

## 2015-10-28 MED ORDER — METOCLOPRAMIDE HCL 10 MG PO TABS: 10.0000 mg | ORAL_TABLET | Freq: Four times a day (QID) | ORAL | Status: DC | PRN

## 2015-10-28 MED ORDER — KETOROLAC TROMETHAMINE 15 MG/ML IJ SOLN
15.0000 mg | Freq: Once | INTRAMUSCULAR | Status: AC
  Administered 2015-10-28: 15 mg via INTRAVENOUS
  Filled 2015-10-28: qty 1

## 2015-10-28 MED ORDER — DIPHENHYDRAMINE HCL 50 MG/ML IJ SOLN: 25.0000 mg | Freq: Once | INTRAMUSCULAR | Status: DC

## 2015-10-28 MED ORDER — METOCLOPRAMIDE HCL 5 MG/ML IJ SOLN
10.0000 mg | Freq: Once | INTRAMUSCULAR | Status: AC
  Administered 2015-10-28: 10 mg via INTRAVENOUS
  Filled 2015-10-28: qty 2

## 2015-10-28 MED ORDER — SODIUM CHLORIDE 0.9 % IV BOLUS (SEPSIS)
1000.0000 mL | Freq: Once | INTRAVENOUS | Status: AC
  Administered 2015-10-28: 1000 mL via INTRAVENOUS

## 2015-10-28 NOTE — Discharge Instructions (Signed)

## 2015-10-28 NOTE — ED Notes (Signed)
Pt verbalizes understanding of d/c instructions and denies any further needs at this time. 

## 2015-10-28 NOTE — ED Notes (Signed)
Pt c/o left side head pain that started this morning and is behind left eye.  She c/o photosensitivity and nausea with head pain.  Pt tried two doses of ibuprofen without relief.

## 2015-10-28 NOTE — ED Provider Notes (Signed)
CSN: [id number]     Arrival date & time 10/27/15  2259 History   First MD Initiated Contact with Patient 10/28/15 0139     Chief Complaint  Patient presents with  . Headache     (Consider location/radiation/quality/duration/timing/severity/associated sxs/prior Treatment) HPI  This is a 60 year old female who complains of a headache since yesterday morning. The headache has been constant. She locates it behind her left eye. It is not a sharp pain. She states it is severe. She has taken ibuprofen without relief. She has had transient nausea but no vomiting. She denies blurred vision. She has had similar headaches in the past.  She also complains of dizziness ("woozy") when standing and hearing her heart pulsating in her ears. This is a frequent problem and not new today.  Past Medical History  Diagnosis Date  . Vertigo   . Thyroid disease     enlarged thyroid  . Sickle cell trait ([employer])   . Vitamin D deficiency   . Hyperlipidemia     borderline  . Headache    Past Surgical History  Procedure Laterality Date  . Tubal ligation    . Foot surgery Bilateral     Pt states she had bones removed from the sides of her feet and middle toes so she could walk better.  . Tonsillectomy  1975   Family History  Problem Relation Age of Onset  . Heart disease Mother     Investment banker, corporate  . Diabetes Mother   . Heart disease Paternal Grandmother     died of CHF  . Diabetes Paternal Grandmother   . Cancer Neg Hx   . Kidney disease Neg Hx   . Colon cancer Neg Hx   . Colon polyps Neg Hx   . Rectal cancer Neg Hx   . Stomach cancer Neg Hx   . Diabetes Brother    Social History  Substance Use Topics  . Smoking status: Current Every Day Smoker -- 0.50 packs/day    Types: Cigarettes  . Smokeless tobacco: Never Used  . Alcohol Use: Yes     Comment: occasional   OB History    No data available     Review of Systems  All other systems reviewed and are negative.   Allergies   Review of patient's allergies indicates no known allergies.  Home Medications   Prior to Admission medications   Medication Sig Start Date End Date Taking? Authorizing Provider  ibuprofen (ADVIL,MOTRIN) 200 MG tablet Take 200 mg by mouth every 6 (six) hours as needed (pain).     Historical Provider, MD  omeprazole (PRILOSEC) 40 MG capsule Take 1 capsule (40 mg total) by mouth daily. 10/03/15   Debbrah Alar, NP  Vitamin D, Ergocalciferol, (DRISDOL) 50000 UNITS CAPS capsule Take 1 capsule (50,000 Units total) by mouth every 7 (seven) days. 04/05/15   Debbrah Alar, NP   BP 129/81 mmHg  Pulse 80  Temp(Src) 98.2 F (36.8 C) (Oral)  Resp 18  Ht 5\' 5"  (1.651 m)  Wt 175 lb (79.379 kg)  BMI 29.12 kg/m2  SpO2 98%   Physical Exam  General: Well-developed, well-nourished female in no acute distress; appearance consistent with age of record HENT: normocephalic; atraumatic Eyes: pupils equal, round and reactive to light; extraocular muscles intact Neck: supple Heart: regular rate and rhythm Lungs: clear to auscultation bilaterally Abdomen: soft; nondistended; nontender; no masses or hepatosplenomegaly; bowel sounds present Extremities: No deformity; full range of motion; pulses normal Neurologic: Awake, alert and oriented; motor  function intact in all extremities and symmetric; no facial droop Skin: Warm and dry Psychiatric: Normal mood and affect    ED Course  Procedures (including critical care time)   MDM  2:33 AM Headache relieved after IV fluids and Reglan.  Shanon Rosser, MD 10/28/15 [phone]

## 2015-11-01 NOTE — Telephone Encounter (Signed)
Yes please

## 2015-11-01 NOTE — Telephone Encounter (Signed)
Pt was no show 10/26/15 10:45am for acute appt, pt has not rescheduled, this is 3rd no show, charge or no charge?

## 2015-11-02 ENCOUNTER — Encounter: Payer: Self-pay | Admitting: Family

## 2015-11-02 NOTE — Telephone Encounter (Signed)
Marked to charge and mailing no show letter °

## 2015-11-04 MED FILL — OMEPRAZOLE DR 20 MG CAPSULE: 20 | 30 days supply | Qty: 60 | Fill #0

## 2015-11-04 MED FILL — METOCLOPRAMIDE 10 MG TABLET: 10 | 2 days supply | Qty: 10 | Fill #0

## 2015-12-04 ENCOUNTER — Other Ambulatory Visit: Payer: Self-pay | Admitting: Family

## 2015-12-14 ENCOUNTER — Encounter: Payer: Self-pay | Admitting: Family

## 2015-12-14 ENCOUNTER — Ambulatory Visit (INDEPENDENT_AMBULATORY_CARE_PROVIDER_SITE_OTHER): Payer: PRIVATE HEALTH INSURANCE | Admitting: Family

## 2015-12-14 VITALS — BP 130/80 | HR 89 | Temp 97.8°F | Resp 18 | Ht 65.0 in | Wt 178.4 lb

## 2015-12-14 DIAGNOSIS — Z114 Encounter for screening for human immunodeficiency virus [HIV]: Secondary | ICD-10-CM

## 2015-12-14 DIAGNOSIS — R42 Dizziness and giddiness: Secondary | ICD-10-CM

## 2015-12-14 DIAGNOSIS — Z1159 Encounter for screening for other viral diseases: Secondary | ICD-10-CM

## 2015-12-14 LAB — HEPATITIS C ANTIBODY: HCV Ab: NEGATIVE

## 2015-12-14 LAB — HIV ANTIBODY (ROUTINE TESTING W REFLEX): HIV 1&2 Ab, 4th Generation: NONREACTIVE

## 2015-12-14 NOTE — Progress Notes (Signed)
Subjective:    Patient ID: Jaime Harris, female    DOB: 1956/01/17, 60 y.o.   MRN: [id number]  HPI  Jaime Harris is a 60 yr old female who presents today to discuss dizziness.  Has tried meclizine in the past.  She reports that sensation is "like she is on a boat." Denies fall.  Occasional HA's, denies nausea, syncope.  Has done vestibular rehab in the past.  She has also seen neurology and it was felt that her dizziness was neuro related.  Saw Cardiology 04/28/15 It was felt that her symptoms were not consistent with orthostasis or cardiac etiology.   Reports that her "heart is beating" in her ears.  Denies hearing loss.     Review of Systems    see HPI  Past Medical History  Diagnosis Date  . Thyroid disease     enlarged thyroid  . Sickle cell trait ([place])   . Vitamin D deficiency   . Hyperlipidemia     borderline  . Headache   . Vertigo     Social History   Social History  . Marital Status: Single    Spouse Name: N/A  . Number of Children: 3  . Years of Education: N/A   Occupational History  .      Cashier   Social History Main Topics  . Smoking status: Current Every Day Smoker -- 0.50 packs/day    Types: Cigarettes  . Smokeless tobacco: Never Used  . Alcohol Use: Yes     Comment: occasional  . Drug Use: No  . Sexual Activity: Not on file   Other Topics Concern  . Not on file   Social History Narrative   Makes eye glasses.     Single,   GED   She has 3 children   73- Son [name]- lives locally- he has one son   64- Daughter [name]- 7 children (4 of these children live with the pt- she is a primary care giver)   [place]- lives in [place] 1 daughter   Enjoys reading    Past Surgical History  Procedure Laterality Date  . Tubal ligation    . Foot surgery Bilateral     Pt states she had bones removed from the sides of her feet and middle toes so she could walk better.  . Tonsillectomy  1975    Family History  Problem Relation Age of Onset   . Heart disease Mother     Investment banker, corporate  . Diabetes Mother   . Heart disease Paternal Grandmother     died of CHF  . Diabetes Paternal Grandmother   . Cancer Neg Hx   . Kidney disease Neg Hx   . Colon cancer Neg Hx   . Colon polyps Neg Hx   . Rectal cancer Neg Hx   . Stomach cancer Neg Hx   . Diabetes Brother     No Known Allergies  Current Outpatient Prescriptions on File Prior to Visit  Medication Sig Dispense Refill  . ibuprofen (ADVIL,MOTRIN) 200 MG tablet Take 200 mg by mouth every 6 (six) hours as needed (pain).     Marland Kitchen metoCLOPramide (REGLAN) 10 MG tablet Take 1 tablet (10 mg total) by mouth every 6 (six) hours as needed (for nausea or headache). 10 tablet 0  . Vitamin D, Ergocalciferol, (DRISDOL) 50000 units CAPS capsule TAKE 1 CAPSULE (50,000 UNITS TOTAL) BY MOUTH EVERY 7 (SEVEN) DAYS. 12 capsule 0   No current facility-administered medications on file  prior to visit.    BP 130/80 mmHg  Pulse 89  Temp(Src) 97.8 F (36.6 C) (Oral)  Resp 18  Ht 5\' 5"  (1.651 m)  Wt 178 lb 6.4 oz (80.922 kg)  BMI 29.69 kg/m2  SpO2 98%    Objective:   Physical Exam  Constitutional: She is oriented to person, place, and time. She appears well-developed and well-nourished.  HENT:  Head: Normocephalic and atraumatic.  Right Ear: Tympanic membrane and ear canal normal.  Left Ear: Tympanic membrane and ear canal normal.  Mouth/Throat: No oropharyngeal exudate, posterior oropharyngeal edema or posterior oropharyngeal erythema.  Cardiovascular: Normal rate, regular rhythm and normal heart sounds.   No murmur heard. Pulmonary/Chest: Effort normal and breath sounds normal. No respiratory distress. She has no wheezes.  Musculoskeletal: She exhibits no edema.  Neurological: She is alert and oriented to person, place, and time.  Skin: Skin is warm and dry.  Psychiatric: She has a normal mood and affect. Her behavior is normal. Judgment and thought content normal.            Assessment & Plan:

## 2015-12-14 NOTE — Progress Notes (Signed)
Pre visit review using our clinic review tool, if applicable. No additional management support is needed unless otherwise documented below in the visit note. 

## 2015-12-14 NOTE — Assessment & Plan Note (Addendum)
We did discuss adding claritin once a day to see if this helps.  She has seen ENT, Neuro, Cardiology, and has done vestibular rehab without improvement.

## 2015-12-14 NOTE — Patient Instructions (Signed)
Please begin claritin 10mg  once daily to see if this helps with your dizziness. Let me know if this helps in the next few weeks.

## 2016-01-18 ENCOUNTER — Telehealth: Payer: Self-pay | Admitting: Family

## 2016-01-18 NOTE — Telephone Encounter (Signed)
Caller name: Self  Can be reached: [phone]   Reason for call: Patient request call back to discuss recurring problems with her vision

## 2016-01-18 NOTE — Telephone Encounter (Signed)
Spoke with pt. States she has been getting lightheaded and nauseous with her dizziness. Vision gets blurred when she looks up and clears after a few seconds. Pt reports normal vision exam a couple of months ago. Has also seen cardiology, neurology and ENT.  Per verbal from PCP, ok to bring in for further evaluation. Pt cannot schedule appt for today. Scheduled appt for tomorrow at 1pm.

## 2016-01-19 ENCOUNTER — Ambulatory Visit (INDEPENDENT_AMBULATORY_CARE_PROVIDER_SITE_OTHER): Payer: PRIVATE HEALTH INSURANCE | Admitting: Family

## 2016-01-19 ENCOUNTER — Encounter: Payer: Self-pay | Admitting: Family

## 2016-01-19 VITALS — BP 131/80 | HR 94 | Temp 98.6°F | Resp 18 | Ht 65.0 in | Wt 179.8 lb

## 2016-01-19 DIAGNOSIS — R51 Headache: Secondary | ICD-10-CM | POA: Diagnosis not present

## 2016-01-19 DIAGNOSIS — R42 Dizziness and giddiness: Secondary | ICD-10-CM

## 2016-01-19 DIAGNOSIS — R519 Headache, unspecified: Secondary | ICD-10-CM

## 2016-01-19 MED ORDER — SUMATRIPTAN SUCCINATE 50 MG PO TABS: ORAL_TABLET | ORAL | Status: DC

## 2016-01-19 NOTE — Patient Instructions (Signed)
You may try imitrex with headache as needed. You will be contacted about your referral to ENT.

## 2016-01-19 NOTE — Progress Notes (Signed)
Subjective:    Patient ID: Jaime Harris, female    DOB: 13-Apr-1956, 60 y.o.   MRN: [id number]  HPI  Ms. Jaime Harris is a 60 yr old female who presents today to discuss dizziness. We discussed this at her last visit in April. Previous treatments tried include meclizine and vestibular rehab without improvement.  She has been evaluated by cardiology and neurology without obvious cause identified.    Reports mild dizziness. Has a slight headache. Reports that if she looks upward makes her more dizzy.  Had nausea yesterday.  Legs feel "like they will buckle" when she is dizzy. She does have intermittent blurred vision.   Review of Systems See HPI  Past Medical History  Diagnosis Date  . Thyroid disease     enlarged thyroid  . Sickle cell trait ([place])   . Vitamin D deficiency   . Hyperlipidemia     borderline  . Headache   . Vertigo      Social History   Social History  . Marital Status: Single    Spouse Name: N/A  . Number of Children: 3  . Years of Education: N/A   Occupational History  .      Cashier   Social History Main Topics  . Smoking status: Current Every Day Smoker -- 0.50 packs/day    Types: Cigarettes  . Smokeless tobacco: Never Used  . Alcohol Use: Yes     Comment: occasional  . Drug Use: No  . Sexual Activity: Not on file   Other Topics Concern  . Not on file   Social History Narrative   Makes eye glasses.     Single,   GED   She has 3 children   96- Son Jaime Harris- lives locally- he has one son   64- Daughter Jaime Harris- 7 children (4 of these children live with the pt- she is a primary care giver)   [place]- lives in [place] 1 daughter   Enjoys reading    Past Surgical History  Procedure Laterality Date  . Tubal ligation    . Foot surgery Bilateral     Pt states she had bones removed from the sides of her feet and middle toes so she could walk better.  . Tonsillectomy  1975    Family History  Problem Relation Age of Onset  . Heart  disease Mother     Investment banker, corporate  . Diabetes Mother   . Heart disease Paternal Grandmother     died of CHF  . Diabetes Paternal Grandmother   . Cancer Neg Hx   . Kidney disease Neg Hx   . Colon cancer Neg Hx   . Colon polyps Neg Hx   . Rectal cancer Neg Hx   . Stomach cancer Neg Hx   . Diabetes Brother     No Known Allergies  Current Outpatient Prescriptions on File Prior to Visit  Medication Sig Dispense Refill  . ibuprofen (ADVIL,MOTRIN) 200 MG tablet Take 200 mg by mouth every 6 (six) hours as needed (pain).     Marland Kitchen metoCLOPramide (REGLAN) 10 MG tablet Take 1 tablet (10 mg total) by mouth every 6 (six) hours as needed (for nausea or headache). 10 tablet 0  . omeprazole (PRILOSEC) 20 MG capsule Take 1 capsule by mouth 2 (two) times daily.  3  . Vitamin D, Ergocalciferol, (DRISDOL) 50000 units CAPS capsule TAKE 1 CAPSULE (50,000 UNITS TOTAL) BY MOUTH EVERY 7 (SEVEN) DAYS. 12 capsule 0   No current facility-administered  medications on file prior to visit.    BP 131/80 mmHg  Pulse 94  Temp(Src) 98.6 F (37 C) (Oral)  Resp 18  Ht 5\' 5"  (1.651 m)  Wt 179 lb 12.8 oz (81.557 kg)  BMI 29.92 kg/m2  SpO2 99%       Objective:   Physical Exam  Constitutional: She is oriented to person, place, and time. She appears well-developed and well-nourished.  HENT:  Head: Normocephalic and atraumatic.  Right Ear: Tympanic membrane and ear canal normal.  Left Ear: Tympanic membrane and ear canal normal.  Mouth/Throat: No oropharyngeal exudate, posterior oropharyngeal edema or posterior oropharyngeal erythema.  Eyes: EOM are normal. Pupils are equal, round, and reactive to light.  Cardiovascular: Normal rate, regular rhythm and normal heart sounds.   No murmur heard. Pulmonary/Chest: Effort normal and breath sounds normal. No respiratory distress. She has no wheezes.  Musculoskeletal: She exhibits no edema.  Neurological: She is alert and oriented to person, place, and time.    Psychiatric: She has a normal mood and affect. Her behavior is normal. Judgment and thought content normal.          Assessment & Plan:

## 2016-01-19 NOTE — Progress Notes (Signed)
Pre visit review using our clinic review tool, if applicable. No additional management support is needed unless otherwise documented below in the visit note. 

## 2016-01-20 NOTE — Assessment & Plan Note (Signed)
No orthostasis noted.  Will refer to ENT for further evaluation.

## 2016-01-20 NOTE — Assessment & Plan Note (Signed)
Trial of prn imitrex.  I wonder if her dizziness may be migraine variant.

## 2016-02-09 ENCOUNTER — Telehealth: Payer: Self-pay

## 2016-02-09 NOTE — Telephone Encounter (Signed)
Dr. Redmond Baseman from ENT called and left vm on my line. States he recently saw pt and does not believe that it is an  Chief Strategy Officer issue. On vm provider made it seem as if pt had recently been seen by [place], after review, last note was from 01/26/15. Pt has noshowed/cancelled appointments since that time and never returned. Called and let Dr. Redmond Baseman office know.

## 2016-02-17 ENCOUNTER — Telehealth: Payer: Self-pay | Admitting: Family

## 2016-02-17 DIAGNOSIS — R42 Dizziness and giddiness: Secondary | ICD-10-CM

## 2016-02-17 NOTE — Telephone Encounter (Signed)
°  Relationship to patient: Self Can be reached: [phone]     Reason for call: Patient request call back about seeing a Neurologist as advised by Dr. Redmond Baseman (ENT).

## 2016-02-18 NOTE — Telephone Encounter (Signed)
Could we please request records from Dr. Redmond Baseman and let pt know that I will review his recommendations and then contact her.

## 2016-02-18 NOTE — Telephone Encounter (Signed)
Spoke with Jonelle Sidle at [phone] and requests note. Awaiting report.

## 2016-02-18 NOTE — Telephone Encounter (Signed)
Records received and forwarded to PCP for review.  Please advise?

## 2016-02-18 NOTE — Telephone Encounter (Signed)
I reviewed the report.  I will arrange a follow up visit with her neurologist, Dr. Tomi Likens.

## 2016-02-18 NOTE — Telephone Encounter (Signed)
Notified pt and she voices understanding. 

## 2016-02-22 ENCOUNTER — Ambulatory Visit: Payer: PRIVATE HEALTH INSURANCE | Admitting: Neurology

## 2016-03-21 ENCOUNTER — Ambulatory Visit (INDEPENDENT_AMBULATORY_CARE_PROVIDER_SITE_OTHER): Payer: PRIVATE HEALTH INSURANCE | Admitting: Family

## 2016-03-21 ENCOUNTER — Encounter: Payer: Self-pay | Admitting: Family

## 2016-03-21 VITALS — BP 120/70 | HR 87 | Temp 98.1°F | Ht 65.0 in | Wt 182.0 lb

## 2016-03-21 DIAGNOSIS — R519 Headache, unspecified: Secondary | ICD-10-CM

## 2016-03-21 DIAGNOSIS — R51 Headache: Secondary | ICD-10-CM

## 2016-03-21 DIAGNOSIS — R42 Dizziness and giddiness: Secondary | ICD-10-CM | POA: Diagnosis not present

## 2016-03-21 DIAGNOSIS — E559 Vitamin D deficiency, unspecified: Secondary | ICD-10-CM | POA: Diagnosis not present

## 2016-03-21 LAB — VITAMIN D 25 HYDROXY (VIT D DEFICIENCY, FRACTURES): VITD: 31.87 ng/mL (ref 30.00–100.00)

## 2016-03-21 NOTE — Assessment & Plan Note (Signed)
Mild.  Pt reports well controlled with use of prn ibuprofen.

## 2016-03-21 NOTE — Patient Instructions (Addendum)
I have sent a note to Dr. Tomi Likens to see if we can move up your neurology appointment.  I think this is the next step for evaluation of your dizziness. Please schedule a complete physical at the front desk.

## 2016-03-21 NOTE — Assessment & Plan Note (Signed)
Unchanged. I have advised her that at this point, I think that the next step should be follow up with her neurologist. Appointment is currently scheduled with Dr. Tomi Likens for 9/20 and I have sent him a note requesting a sooner apt if possible.

## 2016-03-21 NOTE — Progress Notes (Signed)
Pre visit review using our clinic review tool, if applicable. No additional management support is needed unless otherwise documented below in the visit note. 

## 2016-03-21 NOTE — Progress Notes (Signed)
Subjective:    Patient ID: Jaime Harris, female    DOB: 12/16/1955, 60 y.o.   MRN: [id number]  HPI  Jaime Harris is a 60 yr old female who presents today with chief complaint of dizziness.  Pt reports that she has been having intermittent dizzy spells x 2 months. We last saw her for this complaint back in May of this year.  Previous treatments tried include meclizine and vestibular rehab without improvement. She has been evaluated by cardiology and neurology without obvious cause identified. Last visit she was given a trial of prn imitrex for migraine.  A referral to ENT was also made.  ENT consult is reviewed and he did not feel that her symptoms were consistent with vertigo and is was suggested that she follow up with her neurologist for evaluation of her vertebrbasilar vasculature.   She reports that she had she sometimes feels "jittery." Feels sometimes like her hands are fumbling.  denies LOC or witnessed seizures.    She reports some mild headaches, never picked up the imitrex.  Uses ibuprofen PRN which usually relieves her headache.   Review of Systems See HPI  Past Medical History  Diagnosis Date  . Thyroid disease     enlarged thyroid  . Sickle cell trait ([place])   . Vitamin D deficiency   . Hyperlipidemia     borderline  . Headache   . Vertigo      Social History   Social History  . Marital Status: Single    Spouse Name: N/A  . Number of Children: 3  . Years of Education: N/A   Occupational History  .      Cashier   Social History Main Topics  . Smoking status: Current Every Day Smoker -- 0.50 packs/day    Types: Cigarettes  . Smokeless tobacco: Never Used  . Alcohol Use: Yes     Comment: occasional  . Drug Use: No  . Sexual Activity: Not on file   Other Topics Concern  . Not on file   Social History Narrative   Makes eye glasses.     Single,   GED   She has 3 children   59- Son [name]- lives locally- he has one son   39- Daughter [name]- 7  children (4 of these children live with the pt- she is a primary care giver)   [place]- lives in [place] 1 daughter   Enjoys reading    Past Surgical History  Procedure Laterality Date  . Tubal ligation    . Foot surgery Bilateral     Pt states she had bones removed from the sides of her feet and middle toes so she could walk better.  . Tonsillectomy  1975    Family History  Problem Relation Age of Onset  . Heart disease Mother     Investment banker, corporate  . Diabetes Mother   . Heart disease Paternal Grandmother     died of CHF  . Diabetes Paternal Grandmother   . Cancer Neg Hx   . Kidney disease Neg Hx   . Colon cancer Neg Hx   . Colon polyps Neg Hx   . Rectal cancer Neg Hx   . Stomach cancer Neg Hx   . Diabetes Brother     No Known Allergies  Current Outpatient Prescriptions on File Prior to Visit  Medication Sig Dispense Refill  . ibuprofen (ADVIL,MOTRIN) 200 MG tablet Take 200 mg by mouth every 6 (six) hours as needed (pain).     Marland Kitchen  Vitamin D, Ergocalciferol, (DRISDOL) 50000 units CAPS capsule TAKE 1 CAPSULE (50,000 UNITS TOTAL) BY MOUTH EVERY 7 (SEVEN) DAYS. 12 capsule 0  . metoCLOPramide (REGLAN) 10 MG tablet Take 1 tablet (10 mg total) by mouth every 6 (six) hours as needed (for nausea or headache). (Patient not taking: Reported on 03/21/2016) 10 tablet 0  . omeprazole (PRILOSEC) 20 MG capsule Take 1 capsule by mouth 2 (two) times daily. Reported on 03/21/2016  3  . SUMAtriptan (IMITREX) 50 MG tablet 1 tab at start of headache, may repeat in 2 hours if needed. Max 2 tabs in 24 hours. (Patient not taking: Reported on 03/21/2016) 10 tablet 1   No current facility-administered medications on file prior to visit.    BP 120/70 mmHg  Pulse 87  Temp(Src) 98.1 F (36.7 C) (Oral)  Ht 5\' 5"  (1.651 m)  Wt 182 lb (82.555 kg)  BMI 30.29 kg/m2  SpO2 97%       Objective:   Physical Exam  Constitutional: She is oriented to person, place, and time. She appears  well-developed and well-nourished.  HENT:  Head: Normocephalic and atraumatic.  Eyes: EOM are normal.  Cardiovascular: Normal rate, regular rhythm and normal heart sounds.   No murmur heard. Pulmonary/Chest: Effort normal and breath sounds normal. No respiratory distress. She has no wheezes.  Neurological: She is alert and oriented to person, place, and time. No cranial nerve deficit. She exhibits normal muscle tone. Coordination normal.  Psychiatric: She has a normal mood and affect. Her behavior is normal. Judgment and thought content normal.          Assessment & Plan:  Vit D deficiency- due for follow up level today.

## 2016-03-22 ENCOUNTER — Telehealth: Payer: Self-pay | Admitting: Family

## 2016-03-22 ENCOUNTER — Ambulatory Visit: Payer: PRIVATE HEALTH INSURANCE | Admitting: Neurology

## 2016-03-22 ENCOUNTER — Encounter: Payer: Self-pay | Admitting: Neurology

## 2016-03-22 ENCOUNTER — Ambulatory Visit (INDEPENDENT_AMBULATORY_CARE_PROVIDER_SITE_OTHER): Payer: PRIVATE HEALTH INSURANCE | Admitting: Neurology

## 2016-03-22 ENCOUNTER — Other Ambulatory Visit: Payer: Self-pay

## 2016-03-22 VITALS — BP 136/80 | HR 104 | Ht 65.0 in | Wt 184.0 lb

## 2016-03-22 DIAGNOSIS — G4440 Drug-induced headache, not elsewhere classified, not intractable: Secondary | ICD-10-CM

## 2016-03-22 DIAGNOSIS — Z720 Tobacco use: Secondary | ICD-10-CM

## 2016-03-22 DIAGNOSIS — R42 Dizziness and giddiness: Secondary | ICD-10-CM

## 2016-03-22 DIAGNOSIS — G4441 Drug-induced headache, not elsewhere classified, intractable: Secondary | ICD-10-CM | POA: Diagnosis not present

## 2016-03-22 DIAGNOSIS — H93A9 Pulsatile tinnitus, unspecified ear: Secondary | ICD-10-CM | POA: Diagnosis not present

## 2016-03-22 DIAGNOSIS — G44221 Chronic tension-type headache, intractable: Secondary | ICD-10-CM | POA: Diagnosis not present

## 2016-03-22 MED ORDER — NORTRIPTYLINE HCL 25 MG PO CAPS: 25.0000 mg | ORAL_CAPSULE | Freq: Every day | ORAL | Status: DC

## 2016-03-22 MED ORDER — VITAMIN D3 75 MCG (3000 UT) PO TABS: 1.0000 | ORAL_TABLET | Freq: Every day | ORAL | Status: DC

## 2016-03-22 NOTE — Patient Instructions (Signed)
1.  Stop ibuprofen.  Instead, take either Tylenol or Excedrin.  Limit [place] of pain relievers to no more than 2 days out of the week. 2.  Star nortriptyline 25mg  at bedtime.  Contact me in 4 weeks with update.  If headaches not better, we can increase dose if needed. 3.  We will check CTA of head 4.  Follow up in 3 months.

## 2016-03-22 NOTE — Progress Notes (Signed)
NEUROLOGY FOLLOW UP OFFICE NOTE  Jaime Harris [id number]  HISTORY OF PRESENT ILLNESS: Jaime Harris is a 60 year old right-handed woman with enlarged thyroid, tobacco abuse and sickle cell trait who follows up for dizziness and chronic tension type headaches.    I have not seen her for over a year.  She was lost to follow up.     She started having dizziness about 3 years ago.  When I saw her last year, she described her dizziness as "lightheadedness" but not "spinning".  She said it would last 2 minutes but occurs several times a day.  It occurs spontaneously, even when already standing up.  If she is walking during a spell, she feels off-balance.  Her legs buckle.  She does not have diplopia, vision loss, focal numbness or weakness.  Last year, she was reportedly found to be orthostatic.  Vestibular rehab and meclizine were ineffective.  CT of head was performed on 08/21/14 to evaluate her symptoms, which was negative.  Since her last visit with me, she had an MRI of the brain on 01/30/15, which was personally reviewed and was also negative.  2D echo from 02/03/15 was unremarkable with LVEF of 60-65%.  CBC and CMP from January were unremarkable except for mildly elevated alk phos of 162, which has been noted since December 2015.  Labs from April were negative for HIV and Hep C.   In May 2016 , she developed a new headache.  It is bi-temporal and in the back of the neck.  It is a non-throbbing pressure headache, about 8/10.  There is photophobia but no nausea or phonophobia.  It is constant.  She has been taking ibuprofen daily.  In July 2016, she described to her PCP a change in her headaches, including nausea.  Since her symptoms now sounded migrainous, she was prescribed sumatriptan.  She had an MRI of the brain without contrast performed on 01/30/15 was personally reviewed and was normal. To further evaluate dizziness, she underwent cardiac workup, which was negative.    She was evaluated by  ENT on 02/04/16 for the dizziness.  Symptoms were not felt to be inner ear.  However, the otolaryngologist raised the possibility of vertebrobasilar insufficiency.  She has no prior history of headache.  She has no family history of headache or aneurysms.    She reports no real changes.  Dizzy spells are brief and occur several times daily, not positional.  Headaches are daily.  Description is now consistent with tension-type headaches, as before.  She no longer takes sumatriptan.  She has still been taking ibuprofen daily.  She now reports that she feels/hears her heartbeat in her head.  PAST MEDICAL HISTORY: Past Medical History  Diagnosis Date  . Thyroid disease     enlarged thyroid  . Sickle cell trait ([place])   . Vitamin D deficiency   . Hyperlipidemia     borderline  . Headache   . Vertigo     MEDICATIONS: Current Outpatient Prescriptions on File Prior to Visit  Medication Sig Dispense Refill  . metoCLOPramide (REGLAN) 10 MG tablet Take 1 tablet (10 mg total) by mouth every 6 (six) hours as needed (for nausea or headache). 10 tablet 0  . Multiple Vitamin (MULTIVITAMIN) tablet Take 1 tablet by mouth daily.    Marland Kitchen omeprazole (PRILOSEC) 20 MG capsule Take 1 capsule by mouth 2 (two) times daily. Reported on 03/21/2016  3   No current facility-administered medications on file prior to visit.  ALLERGIES: No Known Allergies  FAMILY HISTORY: Family History  Problem Relation Age of Onset  . Heart disease Mother     Investment banker, corporate  . Diabetes Mother   . Heart disease Paternal Grandmother     died of CHF  . Diabetes Paternal Grandmother   . Cancer Neg Hx   . Kidney disease Neg Hx   . Colon cancer Neg Hx   . Colon polyps Neg Hx   . Rectal cancer Neg Hx   . Stomach cancer Neg Hx   . Diabetes Brother     SOCIAL HISTORY: Social History   Social History  . Marital Status: Single    Spouse Name: N/A  . Number of Children: 3  . Years of Education: N/A    Occupational History  .      Cashier   Social History Main Topics  . Smoking status: Current Every Day Smoker -- 0.50 packs/day    Types: Cigarettes  . Smokeless tobacco: Never Used  . Alcohol Use: Yes     Comment: occasional  . Drug Use: No  . Sexual Activity: Not on file   Other Topics Concern  . Not on file   Social History Narrative   Makes eye glasses.     Single,   GED   She has 3 children   66- Son [name]- lives locally- he has one son   66- Daughter [name]- 7 children (4 of these children live with the pt- she is a primary care giver)   [place]- lives in [place] 1 daughter   Enjoys reading    REVIEW OF SYSTEMS: Constitutional: No fevers, chills, or sweats, no generalized fatigue, change in appetite Eyes: No visual changes, double vision, eye pain Ear, nose and throat: No hearing loss, ear pain, nasal congestion, sore throat Cardiovascular: No chest pain, palpitations Respiratory:  No shortness of breath at rest or with exertion, wheezes GastrointestinaI: No nausea, vomiting, diarrhea, abdominal pain, fecal incontinence Genitourinary:  No dysuria, urinary retention or frequency Musculoskeletal:  No neck pain, back pain Integumentary: No rash, pruritus, skin lesions Neurological: as above Psychiatric: No depression, insomnia, anxiety Endocrine: No palpitations, fatigue, diaphoresis, mood swings, change in appetite, change in weight, increased thirst Hematologic/Lymphatic:  No purpura, petechiae. Allergic/Immunologic: no itchy/runny eyes, nasal congestion, recent allergic reactions, rashes  PHYSICAL EXAM: Filed Vitals:   03/22/16 1338  BP: 136/80  Pulse: 104   General: No acute distress.  Patient appears well-groomed.  normal body habitus. Head:  Normocephalic/atraumatic Eyes:  Fundi examined but not visualized Neck: supple, no paraspinal tenderness, full range of motion Heart:  Regular rate and rhythm Lungs:  Clear to auscultation  bilaterally Back: No paraspinal tenderness Neurological Exam: alert and oriented to person, place, and time. Attention span and concentration intact, recent and remote memory intact, fund of knowledge intact.  Speech fluent and not dysarthric, language intact.  CN II-XII intact. Bulk and tone normal, muscle strength 5/5 throughout.  Sensation to light touch, temperature and vibration intact.  Deep tendon reflexes 2+ throughout, toes downgoing.  Finger to nose and heel to shin testing intact.  Gait normal, Romberg negative.  IMPRESSION: Lightheadedness/Dizziness.  She has no focal symptoms to suggest vertebrobasilar insufficiency or vestibulopathy.  Chronic tension type headache complicated by medication overuse  Not typical description for pulsatile tinnitus, but she hears her heartbeat in her head.  Tobacco use  PLAN: 1.  Start nortriptyline 82m at bedtime. 2.  Stop ibuprofen.  Instead, take Excedrin or Tylenol, limited  to no more than 2 days out of the week 3.  Since there is no explanation for her dizziness, will get CTA of head 4.  Smoking cessation 5.  Follow up in 3 months  25 minutes spent face to face with patient, over 50% spent discussing diagnosis and management.  Metta Clines, DO  CC:  Debbrah Alar, NP

## 2016-03-22 NOTE — Telephone Encounter (Signed)
Called and spoke with the pt and informed her of recent lab results and note.   Pt verbalized understanding and agreed. //AB/CMA 

## 2016-03-22 NOTE — Telephone Encounter (Signed)
Follow up vitamin D level is normal.  Please stop the weekly vitamin D 50000 and instead start an otc vit D supplement 3000 units once daily.

## 2016-03-24 ENCOUNTER — Emergency Department (HOSPITAL_BASED_OUTPATIENT_CLINIC_OR_DEPARTMENT_OTHER): Payer: PRIVATE HEALTH INSURANCE

## 2016-03-24 ENCOUNTER — Emergency Department (HOSPITAL_BASED_OUTPATIENT_CLINIC_OR_DEPARTMENT_OTHER)
Admission: EM | Admit: 2016-03-24 | Discharge: 2016-03-24 | Disposition: A | Discharge status: Home / Self Care | Payer: PRIVATE HEALTH INSURANCE | Attending: Emergency Medicine | Admitting: Emergency Medicine

## 2016-03-24 ENCOUNTER — Encounter (HOSPITAL_BASED_OUTPATIENT_CLINIC_OR_DEPARTMENT_OTHER): Payer: Self-pay | Admitting: Emergency Medicine

## 2016-03-24 DIAGNOSIS — H43392 Other vitreous opacities, left eye: Secondary | ICD-10-CM | POA: Diagnosis not present

## 2016-03-24 DIAGNOSIS — R51 Headache: Secondary | ICD-10-CM | POA: Diagnosis not present

## 2016-03-24 DIAGNOSIS — H579 Unspecified disorder of eye and adnexa: Secondary | ICD-10-CM | POA: Diagnosis present

## 2016-03-24 DIAGNOSIS — E785 Hyperlipidemia, unspecified: Secondary | ICD-10-CM | POA: Insufficient documentation

## 2016-03-24 DIAGNOSIS — F17210 Nicotine dependence, cigarettes, uncomplicated: Secondary | ICD-10-CM | POA: Diagnosis not present

## 2016-03-24 DIAGNOSIS — Z79899 Other long term (current) drug therapy: Secondary | ICD-10-CM | POA: Insufficient documentation

## 2016-03-24 HISTORY — DX: Chronic tension-type headache, not intractable: G44.229

## 2016-03-24 LAB — CBG MONITORING, ED: Glucose-Capillary: 106 mg/dL — ABNORMAL HIGH (ref 65–99)

## 2016-03-24 NOTE — ED Provider Notes (Signed)
CSN: [id number]     Arrival date & time 03/24/16  0250 History   First MD Initiated Contact with Patient 03/24/16 [phone]     Chief Complaint  Patient presents with  . Eye Problem     (Consider location/radiation/quality/duration/timing/severity/associated sxs/prior Treatment) HPI Patient states she went to sleep at 11:30PM. She woke up at 2:30 AM with a floating dark circular's spot in the center of her left eye. Denies any pain. No changes in her visual acuity. No known injury. Denies any weakness or numbness. Has mild headache. Past Medical History  Diagnosis Date  . Thyroid disease     enlarged thyroid  . Sickle cell trait ([place])   . Vitamin D deficiency   . Hyperlipidemia     borderline  . Headache   . Vertigo   . Chronic tension headaches    Past Surgical History  Procedure Laterality Date  . Tubal ligation    . Foot surgery Bilateral     Pt states she had bones removed from the sides of her feet and middle toes so she could walk better.  . Tonsillectomy  1975   Family History  Problem Relation Age of Onset  . Heart disease Mother     Investment banker, corporate  . Diabetes Mother   . Heart disease Paternal Grandmother     died of CHF  . Diabetes Paternal Grandmother   . Cancer Neg Hx   . Kidney disease Neg Hx   . Colon cancer Neg Hx   . Colon polyps Neg Hx   . Rectal cancer Neg Hx   . Stomach cancer Neg Hx   . Diabetes Brother    Social History  Substance Use Topics  . Smoking status: Current Every Day Smoker -- 0.50 packs/day    Types: Cigarettes  . Smokeless tobacco: Never Used  . Alcohol Use: Yes     Comment: occasional   OB History    No data available     Review of Systems  Constitutional: Negative for fever and chills.  Eyes: Positive for visual disturbance. Negative for photophobia, pain, discharge, redness and itching.  Gastrointestinal: Negative for nausea and vomiting.  Musculoskeletal: Negative for neck pain.  Skin: Negative for rash and  wound.  Neurological: Positive for headaches. Negative for dizziness, syncope, weakness, light-headedness and numbness.  All other systems reviewed and are negative.     Allergies  Review of patient's allergies indicates no known allergies.  Home Medications   Prior to Admission medications   Medication Sig Start Date End Date Taking? Authorizing Provider  Cholecalciferol (VITAMIN D3) 3000 units TABS Take 1 tablet by mouth daily. 03/22/16   Debbrah Alar, NP  metoCLOPramide (REGLAN) 10 MG tablet Take 1 tablet (10 mg total) by mouth every 6 (six) hours as needed (for nausea or headache). 10/28/15   Shanon Rosser, MD  Multiple Vitamin (MULTIVITAMIN) tablet Take 1 tablet by mouth daily.    Historical Provider, MD  nortriptyline (PAMELOR) 25 MG capsule Take 1 capsule (25 mg total) by mouth at bedtime. 03/22/16   Pieter Partridge, DO  omeprazole (PRILOSEC) 20 MG capsule Take 1 capsule by mouth 2 (two) times daily. Reported on 03/21/2016 11/04/15   Historical Provider, MD  Vitamin D, Ergocalciferol, (DRISDOL) 50000 units CAPS capsule TAKE 1 CAPSULE (50,000 UNITS TOTAL) BY MOUTH EVERY 7 (SEVEN) DAYS. 01/01/16   Historical Provider, MD   BP 138/79 mmHg  Pulse 80  Temp(Src) 97.4 F (36.3 C) (Oral)  Resp 16  SpO2 99%  Physical Exam  Constitutional: She is oriented to person, place, and time. She appears well-developed and well-nourished. No distress.  HENT:  Head: Normocephalic and atraumatic.  Mouth/Throat: Oropharynx is clear and moist. No oropharyngeal exudate.  Eyes: Conjunctivae and EOM are normal. Pupils are equal, round, and reactive to light. Right eye exhibits no discharge. Left eye exhibits no discharge.  No hyphema present no photophobia or consensual photophobia present pupils are 3 mm and reactive. Difficult to visualize retina completely but no evidence of detachment or any other abnormalities. Visual fields intact.  Neck: Normal range of motion. Neck supple.  No bruits present   Cardiovascular: Normal rate and regular rhythm.  Exam reveals no gallop and no friction rub.   No murmur heard. Pulmonary/Chest: Effort normal and breath sounds normal. No stridor. No respiratory distress. She has no wheezes. She has no rales. She exhibits no tenderness.  Abdominal: Soft. Bowel sounds are normal. She exhibits no distension and no mass. There is no tenderness. There is no rebound and no guarding.  Musculoskeletal: Normal range of motion. She exhibits no edema or tenderness.  Neurological: She is alert and oriented to person, place, and time.  Patient is alert and oriented x3 with clear, goal oriented speech. Patient has 5/5 motor in all extremities. Sensation is intact to light touch. Bilateral finger-to-nose is normal with no signs of dysmetria. Patient has a normal gait and walks without assistance.  Skin: Skin is warm and dry. No rash noted. No erythema.  Psychiatric: She has a normal mood and affect. Her behavior is normal.  Nursing note and vitals reviewed.   ED Course  Procedures (including critical care time) Labs Review Labs Reviewed  CBG MONITORING, ED - Abnormal; Notable for the following:    Glucose-Capillary 106 (*)    All other components within normal limits    Imaging Review Ct Orbitss W/o Cm  03/24/2016  CLINICAL DATA:  Black spot in the left visual field. No trauma or stroke EXAM: CT ORBITS WITHOUT CONTRAST TECHNIQUE: Multidetector CT imaging of the orbits was performed following the standard protocol without intravenous contrast. COMPARISON:  MRI brain 01/30/2015.  CT head 08/21/2014. FINDINGS: The globes and extraocular muscles appear symmetrical and intact. Optic nerves are symmetrical in appearance. No infiltration or soft tissue swelling in or around the orbits. Visualized portions of paranasal sinuses demonstrate partial opacification of the right sphenoid sinus. No acute air-fluid levels demonstrated. Visualized bones appear intact. Visualized  intracranial contents are grossly unremarkable. IMPRESSION: No focal abnormality demonstrated in the orbits on noncontrast CT. MRI would be more sensitive for evaluation of the orbits if clinically indicated. Electronically Signed   By: Lucienne Capers M.D.   On: 03/24/2016 04:21   I have personally reviewed and evaluated these images and lab results as part of my medical decision-making.   EKG Interpretation None      MDM   Final diagnoses:  Floaters in visual field, left   Concern for possible retinal detachment or posterior vitreous detachment. No abnormalities visualized CT. Unable to get MRI or ultrasound. Discussed with ophthalmology and suggested  having the patient follow-up in the office today at 9 AM. This was relayed to the patient and she verbalized understanding of the need for more thorough exam. Return precautions have been given.    Julianne Rice, MD 03/24/16 [phone]

## 2016-03-24 NOTE — ED Notes (Signed)
Pt woke from sleep noted black spot in visual field denies event or injury

## 2016-03-24 NOTE — Discharge Instructions (Signed)
Eye Floaters  Eye floaters are specks of material that float around inside your eye. A jelly-like fluid (vitreous) fills the inside of your eye. The vitreous is normally clear. It allows light to pass through to tissues at the back of the eye (retina). The retina contains the nerves needed for vision.   Your vitreous can start to shrink and become stringy as you age. Strands of material may start to float around inside the eye. They come from clumps of cells, blood, or other materials. These objects cast shadows on the retina and show up as floaters. Floaters may be more obvious when you look up at the sky or at a bright, blank background. They do not go away completely. In time, however, they may settle below your line of sight. Floaters can be annoying. They do not usually cause vision problems.  Sometimes floaters appear along with flashes. Flashes look like bright, quick streaks of light. They usually occur at the edge of your vision. Flashes result when your vitreous pulls on your retina. They also occur with age. However, they could be a warning sign of a detached retina. This is a serious condition that requires emergency treatment to prevent vision loss.  CAUSES   For most people, eye floaters develop when the vitreous begins to shrink as a normal part of aging. More serious causes of floaters include:   A torn retina.   Injury.    Bleeding inside the eye. Diabetes and other conditions can cause broken retinal blood vessels.   A blood clot in the major vein of the retina or its branches (retinal vein occlusion).   Retinal detachment.    Vitreous detachment.    Inflammation inside the eye (uveitis).    Infection inside the eye.  RISK FACTORS  You may have a higher risk for floaters if:    You are older.   You are nearsighted.   You have diabetes.   You have had cataracts removed.  SIGNS AND SYMPTOMS   Symptoms of floaters include seeing small, shadowy shapes move across your vision. They move  as your eyes move. They drift out of your vision when you keep your eyes still. These shapes may look like:   Specks.   Dots.   Circles.   Squiggly lines.   Thread.  Symptoms of flashes include seeing:   Bursts of light.   Flashing lights.   Lightning streaks.   What is commonly referred to as "stars."  DIAGNOSIS   Your health care provider may diagnose floaters and flashes based on your symptoms. You may need to see an eye care specialist (optometrist or ophthalmologist). The specialist will do an exam to determine whether your floaters are a normal part of aging or a warning sign of a more serious eye problem. The specialist may put drops in your eyes to open your pupils wide (dilate) and then use a special scope (slit lamp) to look inside your eye.   TREATMENT   No treatment is needed for floaters that occur normally with age. Sometimes floaters become severe enough to affect your vision. In rare cases, surgery to remove the vitreous and replace it with a saltwater solution (vitrectomy) may be considered.  HOME CARE INSTRUCTIONS  Keep all follow-up visits as directed by your health care provider. This is important.   SEEK MEDICAL CARE IF:    You have a sudden increase in floaters.   You have floaters along with flashes.   You have floaters   along with any new eye symptoms.  SEEK IMMEDIATE MEDICAL CARE IF:    You have a sudden increase in floaters or flashes that interferes with your vision.   Your vision suddenly changes.     This information is not intended to replace advice given to you by your health care provider. Make sure you discuss any questions you have with your health care provider.     Document Released: 08/31/2003 Document Revised: 09/18/2014 Document Reviewed: 04/22/2014  Elsevier Interactive Patient Education 2016 [employer]

## 2016-03-28 ENCOUNTER — Other Ambulatory Visit: Payer: PRIVATE HEALTH INSURANCE

## 2016-03-29 ENCOUNTER — Encounter: Payer: Self-pay | Admitting: Medical

## 2016-03-29 ENCOUNTER — Ambulatory Visit (INDEPENDENT_AMBULATORY_CARE_PROVIDER_SITE_OTHER): Payer: PRIVATE HEALTH INSURANCE | Admitting: Medical

## 2016-03-29 VITALS — BP 124/78 | HR 88 | Temp 98.1°F | Ht 65.0 in | Wt 182.4 lb

## 2016-03-29 DIAGNOSIS — H6122 Impacted cerumen, left ear: Secondary | ICD-10-CM

## 2016-03-29 DIAGNOSIS — H6981 Other specified disorders of Eustachian tube, right ear: Secondary | ICD-10-CM

## 2016-03-29 DIAGNOSIS — H60391 Other infective otitis externa, right ear: Secondary | ICD-10-CM

## 2016-03-29 DIAGNOSIS — H6121 Impacted cerumen, right ear: Secondary | ICD-10-CM | POA: Diagnosis not present

## 2016-03-29 DIAGNOSIS — H9191 Unspecified hearing loss, right ear: Secondary | ICD-10-CM

## 2016-03-29 MED ORDER — NEOMYCIN-POLYMYXIN-HC 3.5-10000-1 OT SOLN: 3.0000 [drp] | Freq: Four times a day (QID) | OTIC | Status: DC

## 2016-03-29 MED ORDER — FLUTICASONE PROPIONATE 50 MCG/ACT NA SUSP: 2.0000 | Freq: Every day | NASAL | Status: DC

## 2016-03-29 MED ORDER — CEPHALEXIN 500 MG PO CAPS: 500.0000 mg | ORAL_CAPSULE | Freq: Two times a day (BID) | ORAL | Status: DC

## 2016-03-29 NOTE — Progress Notes (Signed)
Pre visit review using our clinic review tool, if applicable. No additional management support is needed unless otherwise documented below in the visit note. 

## 2016-03-29 NOTE — Patient Instructions (Addendum)
For rt ear it appears you may have otitis externa with wax obstruction. Will advise use cortisporin drops for 5 days. Then for 2 days use debrox to rt ear to soften wax.(plan to try to irrigate wax out in 8 days).  For left ear cerumen impaction go ahead and start debrox.  You may have pressure in eustachian tube on rt side. Start flonase.  If any constant moderate- high level ear pain on rt side then start cephalexin oral antibiotic.  Follow up in 8 days or as needed  Hopefully in 8 days can clear wax completely and do in office sceening audiometry. We may need to refer to ENT if above measures fail.

## 2016-03-29 NOTE — Progress Notes (Signed)
Subjective:    Patient ID: Jaime Harris, female    DOB: 1956-01-26, 60 y.o.   MRN: [id number]  HPI  Pt in for rt ear feeling stopped up and decreased hearing(she described abrupt onset). This happened this morning at 12:30 am. No pain. No uri or allergy type symptoms preceding. Does not report history of chronic cerumen impaction.  No otc measures tried before being seen.    Review of Systems  Constitutional: Negative for fever, chills and fatigue.  HENT: Negative for congestion, ear pain, facial swelling, mouth sores, nosebleeds, postnasal drip, sinus pressure and sore throat.   Respiratory: Negative for cough, choking, shortness of breath and wheezing.   Cardiovascular: Negative for chest pain and palpitations.  Neurological: Negative for dizziness and headaches.  Hematological: Negative for adenopathy. Does not bruise/bleed easily.  Psychiatric/Behavioral: Negative for behavioral problems and confusion.    Past Medical History  Diagnosis Date  . Thyroid disease     enlarged thyroid  . Sickle cell trait ([place])   . Vitamin D deficiency   . Hyperlipidemia     borderline  . Headache   . Vertigo   . Chronic tension headaches      Social History   Social History  . Marital Status: Single    Spouse Name: N/A  . Number of Children: 3  . Years of Education: N/A   Occupational History  .      Cashier   Social History Main Topics  . Smoking status: Current Every Day Smoker -- 0.50 packs/day    Types: Cigarettes  . Smokeless tobacco: Never Used  . Alcohol Use: Yes     Comment: occasional  . Drug Use: No  . Sexual Activity: Not on file   Other Topics Concern  . Not on file   Social History Narrative   Makes eye glasses.     Single,   GED   She has 3 children   70- Son [name]- lives locally- he has one son   48- Daughter [name]- 7 children (4 of these children live with the pt- she is a primary care giver)   [place]- lives in [place] 1 daughter   Enjoys reading    Past Surgical History  Procedure Laterality Date  . Tubal ligation    . Foot surgery Bilateral     Pt states she had bones removed from the sides of her feet and middle toes so she could walk better.  . Tonsillectomy  1975    Family History  Problem Relation Age of Onset  . Heart disease Mother     Investment banker, corporate  . Diabetes Mother   . Heart disease Paternal Grandmother     died of CHF  . Diabetes Paternal Grandmother   . Cancer Neg Hx   . Kidney disease Neg Hx   . Colon cancer Neg Hx   . Colon polyps Neg Hx   . Rectal cancer Neg Hx   . Stomach cancer Neg Hx   . Diabetes Brother     No Known Allergies  Current Outpatient Prescriptions on File Prior to Visit  Medication Sig Dispense Refill  . Cholecalciferol (VITAMIN D3) 3000 units TABS Take 1 tablet by mouth daily. 30 tablet   . metoCLOPramide (REGLAN) 10 MG tablet Take 1 tablet (10 mg total) by mouth every 6 (six) hours as needed (for nausea or headache). 10 tablet 0  . Multiple Vitamin (MULTIVITAMIN) tablet Take 1 tablet by mouth daily.    Marland Kitchen  nortriptyline (PAMELOR) 25 MG capsule Take 1 capsule (25 mg total) by mouth at bedtime. 30 capsule 2   No current facility-administered medications on file prior to visit.    BP 124/78 mmHg  Pulse 88  Temp(Src) 98.1 F (36.7 C) (Oral)  Ht 5\' 5"  (1.651 m)  Wt 182 lb 6.4 oz (82.736 kg)  BMI 30.35 kg/m2  SpO2 98%       Objective:   Physical Exam  General  Mental Status - Alert. General Appearance - Well groomed. Not in acute distress.  Skin Rashes- No Rashes.  HEENT Head- Normal. Ear Auditory Canal - Left- Normal. Right - Normal.Tympanic Membrane- Left- moderate cerumen blocking about 60-70% of canal. Right- mild cerumen impaction blocking 30-50% of canal can see tm(post lavage rt tragus tender. She had to stop exam due to pain.) Eye Sclera/Conjunctiva- Left- Normal. Right- Normal. Nose & Sinuses Nasal Mucosa- Left-  Boggy and Congested.  Right-  Boggy and  Congested.Bilateral maxillary and frontal sinus pressure. Mouth & Throat Lips: Upper Lip- Normal: no dryness, cracking, pallor, cyanosis, or vesicular eruption. Lower Lip-Normal: no dryness, cracking, pallor, cyanosis or vesicular eruption. Buccal Mucosa- Bilateral- No Aphthous ulcers. Oropharynx- No Discharge or Erythema. Tonsils: Characteristics- Bilateral- No Erythema or Congestion. Size/Enlargement- Bilateral- No enlargement. Discharge- bilateral-None.  Neck Neck- Supple. No Masses.   Chest and Lung Exam Auscultation: Breath Sounds:-Clear even and unlabored.  Cardiovascular Auscultation:Rythm- Regular, rate and rhythm. Murmurs & Other Heart Sounds:Ausculatation of the heart reveal- No Murmurs.  Lymphatic Head & Neck General Head & Neck Lymphatics: Bilateral: Description- No Localized lymphadenopathy.       Assessment & Plan:  For rt ear it appears you may have otitis externa with wax obstruction. Will advise use cortisporin drops for 5 days. Then for 2 days use debrox to rt ear to soften wax.(plan to try to irrigate wax out in 8 days).  For left ear cerumen impaction go ahead and start debrox.  You may have pressure in eustachian tube on rt side. Start flonase.  If any constant moderate- high level ear pain on rt side then start cephalexin oral antibiotic.  Follow up in 8 days or as needed  Hopefully in 8 days can clear wax completely and do in office sceening audiometry. We may need to refer to ENT if above measures fail.  Raquelle Pietro, Percell Miller, PA-C

## 2016-04-06 ENCOUNTER — Ambulatory Visit (INDEPENDENT_AMBULATORY_CARE_PROVIDER_SITE_OTHER): Payer: PRIVATE HEALTH INSURANCE | Admitting: Medical

## 2016-04-06 ENCOUNTER — Encounter: Payer: Self-pay | Admitting: Medical

## 2016-04-06 VITALS — BP 116/76 | HR 91 | Temp 98.0°F | Ht 65.0 in | Wt 181.0 lb

## 2016-04-06 DIAGNOSIS — H6121 Impacted cerumen, right ear: Secondary | ICD-10-CM | POA: Diagnosis not present

## 2016-04-06 DIAGNOSIS — H938X1 Other specified disorders of right ear: Secondary | ICD-10-CM | POA: Diagnosis not present

## 2016-04-06 DIAGNOSIS — H6691 Otitis media, unspecified, right ear: Secondary | ICD-10-CM

## 2016-04-06 NOTE — Patient Instructions (Addendum)
You have residual wax on rt side. Very difficult to remove due to baseline narrow ear canal. Would recommend debrox for another 4 days. Gentle but not deep use of q- tip may pick up the wax.(but wait until day 5 to try.  Your tm does look pinkish red. Go ahead and start keflex.  For residual  ear pressure use flonase for 7 days.  If you have any residual bothersome symptoms by 10 days notify us and could refer you to ENT. Return if needed as well.

## 2016-04-06 NOTE — Progress Notes (Signed)
Pre visit review using our clinic review tool, if applicable. No additional management support is needed unless otherwise documented below in the visit note. 

## 2016-04-06 NOTE — Progress Notes (Signed)
   Subjective:    Patient ID: Jaime Harris, female    DOB: [birth year], 60 y.o.   MRN: [id number]  HPI   Pt in for follow up. Pt had some ear symptoms on last visit.   Below from last A and P For rt ear it appears you may have otitis externa with wax obstruction. Will advise use cortisporin drops for 5 days. Then for 2 days use debrox to rt ear to soften wax.(plan to try to irrigate wax out in 8 days).  For left ear cerumen impaction go ahead and start debrox.  You may have pressure in eustachian tube on rt side. Start flonase.  If any constant moderate- high level ear pain on rt side then start cephalexin oral antibiotic.  Follow up in 8 days or as needed  Hopefully in 8 days can clear wax completely and do in office sceening audiometry. We may need to refer to ENT if above measures fail. Last visit A and P portion ends.  Now only slight ear pressure rt side(much better). Pt did not have to take antibiotic. Left ear feels normal.     Review of Systems  Constitutional: Negative for chills, fatigue and fever.  HENT:       See hpi  Respiratory: Negative for cough, shortness of breath and wheezing.   Cardiovascular: Negative for chest pain and palpitations.       Objective:   Physical Exam   General  Mental Status - Alert. General Appearance - Well groomed. Not in acute distress.  Skin Rashes- No Rashes.  HEENT Head- Normal. Ear Auditory Canal - Left- Normal. Right - slight scant wax present and deep.Tympanic Membrane- Left- Normal. Right- better seen today and looks pinkish red.(tried to remove residual wax but small canal and unsuccesful.Note pt told by ENT in past this was case)  Eye Sclera/Conjunctiva- Left- Normal. Right- Normal. Nose & Sinuses Nasal Mucosa- Left-  Not  Boggy and Congested. Right- Not   Boggy and  Congested.Bilateral no  maxillary and no frontal sinus pressure. Mouth & Throat Lips: Upper Lip- Normal: no dryness, cracking, pallor,  cyanosis, or vesicular eruption. Lower Lip-Normal: no dryness, cracking, pallor, cyanosis or vesicular eruption. Buccal Mucosa- Bilateral- No Aphthous ulcers. Oropharynx- No Discharge or Erythema. Tonsils: Characteristics- Bilateral- No Erythema or Congestion. Size/Enlargement- Bilateral- No enlargement. Discharge- bilateral-None.  Neck Neck- Supple. No Masses.   Chest and Lung Exam Auscultation: Breath Sounds:-Clear even and unlabored.  Cardiovascular Auscultation:Rythm- Regular, rate and rhythm. Murmurs & Other Heart Sounds:Ausculatation of the heart reveal- No Murmurs.  Lymphatic Head & Neck General Head & Neck Lymphatics: Bilateral: Description- No Localized lymphadenopathy.      Assessment & Plan:  You have residual wax on rt side. Very difficult to remove due to baseline narrow ear canal. Would recommend debrox for another 4 days. Gentle but not deep use of q- tip may pick up the wax.(but wait until day 5 to try.  Your tm does look pinkish red. Go ahead and start keflex.  For residual  ear pressure use flonase for 7 days.  If you have any residual bothersome symptoms by 10 days notify us and could refer you to ENT. Return if needed as well.  Jasiyah Poland, Percell Miller, PA-C

## 2016-04-07 ENCOUNTER — Telehealth: Payer: Self-pay

## 2016-04-07 ENCOUNTER — Ambulatory Visit
Admission: RE | Admit: 2016-04-07 | Discharge: 2016-04-07 | Disposition: A | Discharge status: Home / Self Care | Payer: PRIVATE HEALTH INSURANCE | Source: Ambulatory Visit | Attending: Neurology | Admitting: Neurology

## 2016-04-07 DIAGNOSIS — H93A9 Pulsatile tinnitus, unspecified ear: Secondary | ICD-10-CM

## 2016-04-07 DIAGNOSIS — R42 Dizziness and giddiness: Secondary | ICD-10-CM

## 2016-04-07 MED ORDER — IOPAMIDOL (ISOVUE-370) INJECTION 76%
100.0000 mL | Freq: Once | INTRAVENOUS | Status: AC | PRN
  Administered 2016-04-07: 100 mL via INTRAVENOUS

## 2016-04-07 NOTE — Telephone Encounter (Signed)
-----   Message from Pieter Partridge, DO sent at 04/07/2016 12:11 PM EDT ----- CTA shows no cause for dizziness.  It did happen to show a tiny aneurysm.  She should know that this is an incidental finding and aneurysms this small have very low risk of rupture.  I would recommend repeating MRA of head in one year.  She can follow up at that time.

## 2016-04-07 NOTE — Telephone Encounter (Signed)
Results were left on pt's voicemail, with instructions to call back with any questions or concerns in relation to results. Did place a 1 year reminder for repeat CTA.

## 2016-04-19 ENCOUNTER — Ambulatory Visit (INDEPENDENT_AMBULATORY_CARE_PROVIDER_SITE_OTHER): Payer: PRIVATE HEALTH INSURANCE | Admitting: Family

## 2016-04-19 ENCOUNTER — Encounter: Payer: Self-pay | Admitting: Family

## 2016-04-19 VITALS — BP 112/60 | HR 101 | Temp 97.9°F | Resp 16 | Ht 65.0 in | Wt 184.0 lb

## 2016-04-19 DIAGNOSIS — Z0000 Encounter for general adult medical examination without abnormal findings: Secondary | ICD-10-CM | POA: Diagnosis not present

## 2016-04-19 DIAGNOSIS — Z720 Tobacco use: Secondary | ICD-10-CM | POA: Diagnosis not present

## 2016-04-19 LAB — URINALYSIS, ROUTINE W REFLEX MICROSCOPIC
Bilirubin Urine: NEGATIVE
Ketones, ur: NEGATIVE
Leukocytes, UA: NEGATIVE
NITRITE: NEGATIVE
SPECIFIC GRAVITY, URINE: 1.015 (ref 1.000–1.030)
Total Protein, Urine: NEGATIVE
URINE GLUCOSE: NEGATIVE
Urobilinogen, UA: 0.2 (ref 0.0–1.0)
pH: 5.5 (ref 5.0–8.0)

## 2016-04-19 LAB — BASIC METABOLIC PANEL
BUN: 10 mg/dL (ref 6–23)
CO2: 30 mEq/L (ref 19–32)
Calcium: 9.4 mg/dL (ref 8.4–10.5)
Chloride: 105 mEq/L (ref 96–112)
Creatinine, Ser: 0.84 mg/dL (ref 0.40–1.20)
GFR: 88.92 mL/min (ref >60.00)
GLUCOSE: 87 mg/dL (ref 70–99)
POTASSIUM: 3.6 meq/L (ref 3.5–5.1)
SODIUM: 140 meq/L (ref 135–145)

## 2016-04-19 LAB — HEPATIC FUNCTION PANEL
ALT: 13 U/L (ref 0–35)
AST: 14 U/L (ref 0–37)
Albumin: 4 g/dL (ref 3.5–5.2)
Alkaline Phosphatase: 128 U/L — ABNORMAL HIGH (ref 39–117)
BILIRUBIN DIRECT: 0.1 mg/dL (ref 0.0–0.3)
BILIRUBIN TOTAL: 0.4 mg/dL (ref 0.2–1.2)
Total Protein: 7.1 g/dL (ref 6.0–8.3)

## 2016-04-19 LAB — CBC WITH DIFFERENTIAL/PLATELET
Basophils Absolute: 0 10*3/uL (ref 0.0–0.1)
Basophils Relative: 0.4 % (ref 0.0–3.0)
EOS PCT: 3 % (ref 0.0–5.0)
Eosinophils Absolute: 0.2 10*3/uL (ref 0.0–0.7)
HCT: 39.5 % (ref 36.0–46.0)
HEMOGLOBIN: 13.5 g/dL (ref 12.0–15.0)
Lymphocytes Relative: 41 % (ref 12.0–46.0)
Lymphs Abs: 2.7 10*3/uL (ref 0.7–4.0)
MCHC: 34.2 g/dL (ref 30.0–36.0)
MCV: 86.5 fl (ref 78.0–100.0)
MONOS PCT: 6.9 % (ref 3.0–12.0)
Monocytes Absolute: 0.5 10*3/uL (ref 0.1–1.0)
NEUTROS PCT: 48.7 % (ref 43.0–77.0)
Neutro Abs: 3.2 10*3/uL (ref 1.4–7.7)
Platelets: 186 10*3/uL (ref 150.0–400.0)
RBC: 4.57 Mil/uL (ref 3.87–5.11)
RDW: 14.6 % (ref 11.5–15.5)
WBC: 6.5 10*3/uL (ref 4.0–10.5)

## 2016-04-19 LAB — LIPID PANEL
CHOL/HDL RATIO: 5
Cholesterol: 222 mg/dL — ABNORMAL HIGH (ref 0–200)
HDL: 48 mg/dL (ref >39.00)
LDL CALC: 144 mg/dL — AB (ref 0–99)
NonHDL: 173.64
TRIGLYCERIDES: 148 mg/dL (ref 0.0–149.0)
VLDL: 29.6 mg/dL (ref 0.0–40.0)

## 2016-04-19 LAB — TSH: TSH: 0.99 u[IU]/mL (ref 0.35–4.50)

## 2016-04-19 MED ORDER — VARENICLINE TARTRATE 0.5 MG X 11 & 1 MG X 42 PO MISC: ORAL | 0 refills | Status: DC

## 2016-04-19 NOTE — Progress Notes (Signed)
Pre visit review using our clinic review tool, if applicable. No additional management support is needed unless otherwise documented below in the visit note. 

## 2016-04-19 NOTE — Assessment & Plan Note (Addendum)
Discussed healthy diet, exercise and weight loss. Declines zostavax. Refer for mammogram.

## 2016-04-19 NOTE — Patient Instructions (Addendum)
Try to add 30 minutes of walking 5 days a week.  Begin chantix. Let me know in 3 weeks how it is going and we will send refills.

## 2016-04-19 NOTE — Progress Notes (Addendum)
Subjective:    Patient ID: Jaime Harris, female    DOB: 1956/04/13, 60 y.o.   MRN: [id number]  HPI  Jaime Harris is a 60 yr old female who presents for cpx.  Patient presents today for complete physical.  Immunizations: declines zostavax.  Tetanus up to date. Diet: fair diet- only eats dinner, sweet tea (1) some days. Rare soda.    Wt Readings from Last 3 Encounters:  04/19/16 184 lb (83.5 kg)  04/06/16 181 lb (82.1 kg)  03/29/16 182 lb 6.4 oz (82.7 kg)  Exercise: not exercising.  Colonoscopy: 5/16 Dexa: 8/15 Pap Smear:8/15 Mammogram: 8/15 Vision: up to date Dental: due Tobacco abuse-  1/2 PPD.  Wants to quit.  Tried chantix once in the past.  Was expensive but was on a different insurance. Also tried zyban, chantix worked better.      Review of Systems  Constitutional: Negative for unexpected weight change.  HENT: Negative for rhinorrhea.   Respiratory: Negative for cough.        Some "shortness of breath from smoking"  Cardiovascular: Negative for chest pain.  Gastrointestinal: Negative for blood in stool, diarrhea and nausea.       Reports occasional gerd symptoms  Genitourinary: Negative for dysuria, frequency, hematuria and vaginal bleeding.  Musculoskeletal: Negative for arthralgias and myalgias.  Skin: Negative for rash.  Neurological: Negative for headaches.  Hematological: Negative for adenopathy.  Psychiatric/Behavioral:       Denies depression/anxiety       Past Medical History:  Diagnosis Date  . Chronic tension headaches   . Headache   . Hyperlipidemia    borderline  . Sickle cell trait ([place])   . Thyroid disease    enlarged thyroid  . Vertigo   . Vitamin D deficiency      Social History   Social History  . Marital status: Single    Spouse name: N/A  . Number of children: 3  . Years of education: N/A   Occupational History  .      Cashier   Social History Main Topics  . Smoking status: Current Every Day Smoker    Packs/day: 0.50    Types: Cigarettes  . Smokeless tobacco: Never Used  . Alcohol use Yes     Comment: occasional  . Drug use: No  . Sexual activity: Not on file   Other Topics Concern  . Not on file   Social History Narrative   Makes eye glasses.     Single,   GED   She has 3 children   2- Son Jaime Harris- lives locally- he has one son   46- Daughter Jaime Harris- 7 children (4 of these children live with the pt- she is a primary care giver)   Jaime Harris- lives in [place] 1 daughter   Enjoys reading    Past Surgical History:  Procedure Laterality Date  . FOOT SURGERY Bilateral    Pt states she had bones removed from the sides of her feet and middle toes so she could walk better.  . TONSILLECTOMY  1975  . TUBAL LIGATION      Family History  Problem Relation Age of Onset  . Heart disease Mother     Investment banker, corporate  . Diabetes Mother   . Heart disease Paternal Grandmother     died of CHF  . Diabetes Paternal Grandmother   . Diabetes Brother   . Cancer Neg Hx   . Kidney disease Neg Hx   . Colon cancer  Neg Hx   . Colon polyps Neg Hx   . Rectal cancer Neg Hx   . Stomach cancer Neg Hx     No Known Allergies  Current Outpatient Prescriptions on File Prior to Visit  Medication Sig Dispense Refill  . Cholecalciferol (VITAMIN D3) 3000 units TABS Take 1 tablet by mouth daily. 30 tablet   . nortriptyline (PAMELOR) 25 MG capsule Take 1 capsule (25 mg total) by mouth at bedtime. 30 capsule 2  . cephALEXin (KEFLEX) 500 MG capsule Take 1 capsule (500 mg total) by mouth 2 (two) times daily. (Patient not taking: Reported on 04/19/2016) 20 capsule 0  . fluticasone (FLONASE) 50 MCG/ACT nasal spray Place 2 sprays into both nostrils daily. (Patient not taking: Reported on 04/19/2016) 16 g 1  . metoCLOPramide (REGLAN) 10 MG tablet Take 1 tablet (10 mg total) by mouth every 6 (six) hours as needed (for nausea or headache). (Patient not taking: Reported on 04/19/2016) 10 tablet 0  . Multiple Vitamin  (MULTIVITAMIN) tablet Take 1 tablet by mouth daily.    Marland Kitchen neomycin-polymyxin-hydrocortisone (CORTISPORIN) otic solution Place 3 drops into the right ear 4 (four) times daily. (Patient not taking: Reported on 04/19/2016) 10 mL 1   No current facility-administered medications on file prior to visit.     BP 112/60 (BP Location: Left Arm, Patient Position: Sitting, Cuff Size: Normal)   Pulse (!) 101   Temp 97.9 F (36.6 C) (Oral)   Resp 16   Ht 5\' 5"  (1.651 m)   Wt 184 lb (83.5 kg)   SpO2 99%   BMI 30.62 kg/m    Objective:   Physical Exam Physical Exam  Constitutional: She is oriented to person, place, and time. She appears well-developed and well-nourished. No distress.  HENT:  Head: Normocephalic and atraumatic.  Right Ear: Tympanic membrane and ear canal normal.  Left Ear: Tympanic membrane and ear canal normal.  Mouth/Throat: Oropharynx is clear and moist.  Eyes: Pupils are equal, round, and reactive to light. No scleral icterus.  Neck: Normal range of motion. No thyromegaly present.  Cardiovascular: Normal rate and regular rhythm.   No murmur heard. Pulmonary/Chest: Effort normal and breath sounds normal. No respiratory distress. He has no wheezes. She has no rales. She exhibits no tenderness.  Abdominal: Soft. Bowel sounds are normal. She exhibits no distension and no mass. There is no tenderness. There is no rebound and no guarding.  Musculoskeletal: She exhibits no edema.  Lymphadenopathy:    She has no cervical adenopathy.  Neurological: She is alert and oriented to person, place, and time. She exhibits normal muscle tone. Coordination normal.  Skin: Skin is warm and dry.  Psychiatric: She has a normal mood and affect. Her behavior is normal. Judgment and thought content normal.  Breasts: Examined lying Right: Without masses, retractions, discharge or axillary adenopathy.  Pelvic: deferred          Assessment & Plan:          Assessment & Plan:  EKG tracing  is personally reviewed.  EKG notes NSR.  Note is made of LAFB. Pt is asymptomatic, likely benign. Monitor.

## 2016-04-19 NOTE — Assessment & Plan Note (Signed)
The patient was counseled on the dangers of tobacco use, and was advised to quit.  Reviewed strategies to maximize success, including chantix. She wishes to begin chantix. Has had success in the past with chantix and tolerated the medication.  Pt was counseled on smoking cessation today for 5 minutes. Pt was also counseled to call if she has any mood changes after starting chantix.

## 2016-04-20 ENCOUNTER — Telehealth: Payer: Self-pay | Admitting: Family

## 2016-04-20 DIAGNOSIS — R3129 Other microscopic hematuria: Secondary | ICD-10-CM

## 2016-04-20 NOTE — Telephone Encounter (Signed)
Microscopic blood noted in urine. I would like for her to meet with urology for further evaluation.

## 2016-04-21 ENCOUNTER — Telehealth: Payer: Self-pay | Admitting: Family

## 2016-04-21 ENCOUNTER — Other Ambulatory Visit (INDEPENDENT_AMBULATORY_CARE_PROVIDER_SITE_OTHER): Payer: PRIVATE HEALTH INSURANCE

## 2016-04-21 DIAGNOSIS — E559 Vitamin D deficiency, unspecified: Secondary | ICD-10-CM | POA: Diagnosis not present

## 2016-04-21 NOTE — Telephone Encounter (Signed)
Jaime Harris, could you please see if you could get her in sooner with urology in [place]?

## 2016-04-21 NOTE — Telephone Encounter (Signed)
Notified pt of below result and she states she has scheduled consultation with [facility] but they cannot see her until the end of September. Pt concerned about the long weight with this finding. Pt also requesting remaining lab results. Please advise?

## 2016-04-21 NOTE — Telephone Encounter (Signed)
Left detailed message on pt's voicemail and to call if any questions. Add on form faxed to the lab. Will call pt next week if lab is unable to add to previous specimen.

## 2016-04-21 NOTE — Telephone Encounter (Signed)
I will see if we can get her in sooner in [facility] Drue Dun, could you please check with [place] urology?)Cholesterol was mildly elevated, please work on low fat/low cholesterol diet. Blood count, thyroid normal.  Alk phos is mildly elevated. Common in vit D deficiency. Could you please see if we can ask the lab to add on vit D,dx vit d def?

## 2016-04-21 NOTE — Telephone Encounter (Signed)
Scheduled for 8/15 with [facility] in [place]. Left msg on VM and sent mychart msg to pt to notify of appt

## 2016-04-22 NOTE — Telephone Encounter (Signed)
Thanks, did you also cancel appointment with [facility]?

## 2016-04-24 ENCOUNTER — Ambulatory Visit (HOSPITAL_BASED_OUTPATIENT_CLINIC_OR_DEPARTMENT_OTHER)
Admission: RE | Admit: 2016-04-24 | Discharge: 2016-04-24 | Disposition: A | Discharge status: Home / Self Care | Payer: PRIVATE HEALTH INSURANCE | Source: Ambulatory Visit | Attending: Family | Admitting: Family

## 2016-04-24 ENCOUNTER — Telehealth: Payer: Self-pay | Admitting: Family

## 2016-04-24 DIAGNOSIS — Z1231 Encounter for screening mammogram for malignant neoplasm of breast: Secondary | ICD-10-CM | POA: Insufficient documentation

## 2016-04-24 DIAGNOSIS — E559 Vitamin D deficiency, unspecified: Secondary | ICD-10-CM

## 2016-04-24 DIAGNOSIS — Z0000 Encounter for general adult medical examination without abnormal findings: Secondary | ICD-10-CM

## 2016-04-24 LAB — VITAMIN D 25 HYDROXY (VIT D DEFICIENCY, FRACTURES): VITD: 16.54 ng/mL — ABNORMAL LOW (ref 30.00–100.00)

## 2016-04-24 MED ORDER — VITAMIN D (ERGOCALCIFEROL) 1.25 MG (50000 UNIT) PO CAPS: 50000.0000 [IU] | ORAL_CAPSULE | ORAL | 0 refills | Status: DC

## 2016-04-24 NOTE — Telephone Encounter (Signed)
Attempted to reach pt and left message to check mychart acct. Message sent. Rx sent to pharmacy and future lab order entered.

## 2016-04-24 NOTE — Telephone Encounter (Signed)
Yes I did

## 2016-04-24 NOTE — Telephone Encounter (Signed)
Vitamin D level is low.  Advise patient to begin vit D 50000 units once weekly for 12 weeks, then repeat vit D level (dx Vit D deficiency).    D/c the daily otc 3000 units.

## 2016-04-27 ENCOUNTER — Telehealth: Payer: Self-pay | Admitting: Neurology

## 2016-04-27 ENCOUNTER — Telehealth: Payer: Self-pay | Admitting: Family

## 2016-04-27 MED ORDER — NORTRIPTYLINE HCL 50 MG PO CAPS: 50.0000 mg | ORAL_CAPSULE | Freq: Every day | ORAL | 1 refills | Status: DC

## 2016-04-27 NOTE — Telephone Encounter (Signed)
Pt is taking nortriptyline 25 mg QHS. States that it seemed to be helping, but the last few days headaches have been much worse. Pt states she is feeling weak over the last 2 days as well. Pt has been taking tylenol for pain relief with little to no help. Please advise.

## 2016-04-27 NOTE — Telephone Encounter (Signed)
This medication was prescribed by Dr. Tomi Likens (neurology). Pt should follow-up with their office.

## 2016-04-27 NOTE — Telephone Encounter (Signed)
Increase nortriptyline to 50mg  daily.  I would remain on this dose for 4 weeks and then we can reassess whether we need to change the dose.

## 2016-04-27 NOTE — Telephone Encounter (Signed)
Relation to [id number] Call back number:[phone]   Reason for call:  Patient states nortriptyline (PAMELOR) 25 MG capsule is causing head aches and would like to speak with a nurse. Offered patient appointment patient denied. Please advise.

## 2016-04-27 NOTE — Telephone Encounter (Signed)
Jaime Harris 11/03/1955. Her # is [id number].  She has been taking Norttriptyline and it is not helping her headaches. Thank you

## 2016-04-27 NOTE — Telephone Encounter (Signed)
Results were left on pt's voicemail, with instructions to call back with any questions or concerns in relation to results.   

## 2016-04-27 NOTE — Telephone Encounter (Signed)
Patient informed. 

## 2016-05-31 ENCOUNTER — Ambulatory Visit: Payer: PRIVATE HEALTH INSURANCE | Admitting: Neurology

## 2016-06-27 ENCOUNTER — Ambulatory Visit: Payer: PRIVATE HEALTH INSURANCE | Admitting: Neurology

## 2016-07-19 ENCOUNTER — Ambulatory Visit: Payer: PRIVATE HEALTH INSURANCE | Admitting: Family

## 2016-07-26 ENCOUNTER — Ambulatory Visit: Payer: PRIVATE HEALTH INSURANCE | Admitting: Family

## 2016-08-10 ENCOUNTER — Encounter: Payer: Self-pay | Admitting: Neurology

## 2016-08-10 ENCOUNTER — Ambulatory Visit (INDEPENDENT_AMBULATORY_CARE_PROVIDER_SITE_OTHER): Payer: PRIVATE HEALTH INSURANCE | Admitting: Neurology

## 2016-08-10 VITALS — BP 130/74 | HR 92 | Ht 65.0 in | Wt 185.0 lb

## 2016-08-10 DIAGNOSIS — R42 Dizziness and giddiness: Secondary | ICD-10-CM | POA: Diagnosis not present

## 2016-08-10 DIAGNOSIS — Z720 Tobacco use: Secondary | ICD-10-CM

## 2016-08-10 DIAGNOSIS — G44221 Chronic tension-type headache, intractable: Secondary | ICD-10-CM

## 2016-08-10 MED ORDER — NORTRIPTYLINE HCL 50 MG PO CAPS: 100.0000 mg | ORAL_CAPSULE | Freq: Every day | ORAL | 0 refills | Status: DC

## 2016-08-10 NOTE — Patient Instructions (Signed)
Migraine Recommendations: 1.  Increase nortriptyline to 100mg  at bedtime (I will send new prescription).  Call in 4 to 6 weeks with update and we can adjust dose if needed. 2.  Take Tylenol or Excedrin but limit to no more than 2 days out of the week 3.  Limit use of pain relievers to no more than 2 days out of the weeks. 4.  Be aware of common food triggers such as processed sweets, processed foods with nitrites (such as deli meat, hot dogs, sausages), foods with MSG, alcohol (such as wine), chocolate, certain cheeses, certain fruits (dried fruits, some citrus fruit), vinegar, diet soda. 4.  Avoid caffeine 5.  Routine exercise 6.  Proper sleep hygiene 7.  Stay adequately hydrated with water 8.  Keep a headache diary. 9.  Maintain proper stress management. 10.  Do not skip meals. 11.  Consider supplements:  Magnesium citrate 400mg  to 600mg  daily, riboflavin 400mg , Coenzyme Q 10 100mg  three times daily 12.  MRA of head in August to follow up on aneurysm. 13.  Follow up in 4 months.   Recurrent Migraine Headache Migraines are a type of headache, and they are usually stronger and more sudden than normal headaches (tension headaches). Migraines are characterized by an intense pulsing, throbbing pain that is usually only present on one side of the head. Sometimes, migraine headaches can cause nausea, vomiting, sensitivity to light and sound, and vision changes. Recurrent migraines keep coming back (recurring). A migraine can last from 4 hours up to 3 days. What are the causes? The exact cause of this condition is not known. However, a migraine may be caused when nerves in the brain become irritated and release chemicals that cause inflammation of blood vessels. This inflammation causes pain. Certain things may also trigger migraines, such as:  A disruption in your regular eating and sleeping schedule.  Smoking.  Stress.  Menstruation.  Certain foods and drinks, such as:  Aged  cheese.  Chocolate.  Alcohol.  Caffeine.  Foods or drinks that contain nitrates, glutamate, aspartame, MSG, or tyramine.  Lack of sleep.  Hunger.  Physical exertion.  Fatigue.  High altitude.  Weather changes.  Medicines, such as:  Nitroglycerin, which is used to treat chest pain.  Birth control pills.  Estrogen.  Some blood pressure medicines. What are the signs or symptoms? Symptoms of this condition vary for each person and may include:  Pain that is usually only present on one side of the head. In some cases, the pain may be on both sides of the head or around the head or neck.  Pulsating or throbbing pain.  Severe pain that prevents daily activities.  Pain that is aggravated by any physical activity.  Nausea, vomiting, or both.  Dizziness.  Pain with exposure to bright lights, loud noises, or activity.  General sensitivity to bright lights, loud noises, or smells. Before you get a migraine, you may get warning signs that a migraine is coming (aura). An aura may include:  Seeing flashing lights.  Seeing bright spots, halos, or zigzag lines.  Having tunnel vision or blurred vision.  Having numbness or a tingling feeling.  Having trouble talking.  Having muscle weakness.  Smelling a certain odor. How is this diagnosed? This condition is often diagnosed based on:  Your symptoms and medical history.  A physical exam. You may also have tests, including:  A CT scan or MRI of your brain. These imaging tests cannot diagnose migraines, but they can help to rule  out other causes of headaches.  Blood tests. How is this treated? This condition is treated with:  Medicines. These are used for:  Lessening pain and nausea.  Preventing recurrent migraines.  Lifestyle changes, such as changes to your diet or sleeping patterns.  Behavior therapy, such as relaxation training or biofeedback. Biofeedback is a treatment that involves teaching you to  relax and use your brain to lower your heart rate and control your breathing. Follow these instructions at home: Medicines  Take over-the-counter and prescription medicines only as told by your health care provider.  Do not drive or use heavy machinery while taking prescription pain medicine. Lifestyle  Do not use any products that contain nicotine or tobacco, such as cigarettes and e-cigarettes. If you need help quitting, ask your health care provider.  Limit alcohol intake to no more than 1 drink a day for nonpregnant women and 2 drinks a day for men. One drink equals 12 oz of beer, 5 oz of wine, or 1 oz of hard liquor.  Get 7-9 hours of sleep each night, or the amount of sleep recommended by your health care provider.  Limit your stress. Talk with your health care provider if you need help with stress management.  Maintain a healthy weight. If you need help losing weight, ask your health care provider.  Exercise regularly. Aim for 150 minutes of moderate-intensity exercise (walking, biking, yoga) or 75 minutes of vigorous exercise (running, circuit training, swimming) each week. General instructions  Keep a journal to find out what triggers your migraine headaches so you can avoid these triggers. For example, write down:  What you eat and drink.  How much sleep you get.  Any change to your diet or medicines.  Lie down in a dark, quiet room when you have a migraine.  Try placing a cool towel over your head when you have a migraine.  Keep lights dim, if bright lights bother you and make your migraines worse.  Keep all follow-up visits as told by your health care provider. This is important. Contact a health care provider if:  Your pain does not improve, even with medicine.  Your migraines continue to return, even with medicine.  You have a fever.  You have weight loss. Get help right away if:  Your migraine becomes severe and medicine does not help.  You have a  stiff neck.  You have a loss of vision.  You have muscle weakness or loss of muscle control.  You start losing your balance or have trouble walking.  You feel faint or you pass out.  You develop new, severe symptoms.  You start having abrupt severe headaches that last for a second or less, like a thunderclap. Summary  Migraine headaches are usually stronger and more sudden than normal headaches (tension headaches). Migraines are characterized by an intense pulsing, throbbing pain that is usually only present on one side of the head.  The exact cause of this condition is not known. However, a migraine may be caused when nerves in the brain become irritated and release chemicals that cause inflammation of blood vessels.  Certain things may trigger migraines, such as changes to diet or sleeping patterns, smoking, certain foods, alcohol, stress, and certain medicines.  Sometimes, migraine headaches can cause nausea, vomiting, sensitivity to light and sound, and vision changes.  Migraines are often diagnosed based on your symptoms, medical history, and a physical exam. This information is not intended to replace advice given to you by your  health care provider. Make sure you discuss any questions you have with your health care provider. Document Released: 05/23/2001 Document Revised: 06/09/2016 Document Reviewed: 06/09/2016 Elsevier Interactive Patient Education  2017 [employer].

## 2016-08-10 NOTE — Progress Notes (Addendum)
NEUROLOGY FOLLOW UP OFFICE NOTE  Jaime Harris [id number]  HISTORY OF PRESENT ILLNESS: Jaime Harris is a 60 year old right-handed woman with enlarged thyroid, tobacco abuse and sickle cell trait who follows up for dizziness and chronic tension type headaches.     UPDATE: CTA of head was performed on 04/07/16, which was personally reviewed and revealed no vertebrobasilar insufficiency.  It showed incidental 3.5 mm aneurysm from the right supraclinoid ICA.  For headaches, she is taking nortriptyline 50mg  at bedtime.  She was advised to stop ibuprofen and take Tylenol for abortive therapy.  She still has daily headaches.  She takes Tylenol most days of the week.  Headaches usually 5/10 and last all day.  Severe 8/10 headaches occur about twice a week.  She sometimes wakes up with headache but it is not a different headache.  Since last visit, she has also been treated for right otitis media.  HISTORY: She started having dizziness about 3 years ago.  When I saw her last year, she described her dizziness as "lightheadedness" but not "spinning".  She said it would last 2 minutes but occurs several times a day.  It occurs spontaneously, even when already standing up.  If she is walking during a spell, she feels off-balance.  Her legs buckle.  She does not have diplopia, vision loss, focal numbness or weakness.  Last year, she was reportedly found to be orthostatic.  Vestibular rehab and meclizine were ineffective.  CT of head was performed on 08/21/14 to evaluate her symptoms, which was negative.  Since her last visit with me, she had an MRI of the brain on 01/30/15, which was personally reviewed and was also negative.  2D echo from 02/03/15 was unremarkable with LVEF of 60-65%.    In May 2016 , she developed a new headache.  It is bi-temporal and in the back of the neck.  It is a non-throbbing pressure headache, about 8/10.  There is photophobia but no nausea or phonophobia.  It is constant.  She has  been taking ibuprofen daily.  In July 2016, she described to her PCP a change in her headaches, including nausea.  Previous pain relievers include ibuprofen and sumatriptan.   She had an MRI of the brain without contrast performed on 01/30/15 was personally reviewed and was normal. To further evaluate dizziness, she underwent cardiac workup, which was negative.    She was evaluated by ENT on 02/04/16 for the dizziness.  Symptoms were not felt to be inner ear.    PAST MEDICAL HISTORY: Past Medical History:  Diagnosis Date  . Chronic tension headaches   . Headache   . Hyperlipidemia    borderline  . Sickle cell trait ([place])   . Thyroid disease    enlarged thyroid  . Vertigo   . Vitamin D deficiency     MEDICATIONS: Current Outpatient Prescriptions on File Prior to Visit  Medication Sig Dispense Refill  . Multiple Vitamin (MULTIVITAMIN) tablet Take 1 tablet by mouth daily.    . Vitamin D, Ergocalciferol, (DRISDOL) 50000 units CAPS capsule Take 1 capsule (50,000 Units total) by mouth every 7 (seven) days. 12 capsule 0   No current facility-administered medications on file prior to visit.     ALLERGIES: No Known Allergies  FAMILY HISTORY: Family History  Problem Relation Age of Onset  . Heart disease Mother     Investment banker, corporate  . Diabetes Mother   . Heart disease Paternal Grandmother     died of CHF  .  Diabetes Paternal Grandmother   . Diabetes Brother   . Cancer Neg Hx   . Kidney disease Neg Hx   . Colon cancer Neg Hx   . Colon polyps Neg Hx   . Rectal cancer Neg Hx   . Stomach cancer Neg Hx     SOCIAL HISTORY: Social History   Social History  . Marital status: Single    Spouse name: N/A  . Number of children: 3  . Years of education: N/A   Occupational History  .      Cashier   Social History Main Topics  . Smoking status: Current Every Day Smoker    Packs/day: 0.50    Types: Cigarettes  . Smokeless tobacco: Never Used  . Alcohol use Yes      Comment: occasional  . Drug use: No  . Sexual activity: Not on file   Other Topics Concern  . Not on file   Social History Narrative   Makes eye glasses.     Single,   GED   She has 3 children   37- Son [name]- lives locally- he has one son   23- Daughter [name]- 7 children (4 of these children live with the pt- she is a primary care giver)   [place]- lives in [place] 1 daughter   Enjoys reading    REVIEW OF SYSTEMS: Constitutional: No fevers, chills, or sweats, no generalized fatigue, change in appetite Eyes: No visual changes, double vision, eye pain Ear, nose and throat: No hearing loss, ear pain, nasal congestion, sore throat Cardiovascular: No chest pain, palpitations Respiratory:  No shortness of breath at rest or with exertion, wheezes GastrointestinaI: No nausea, vomiting, diarrhea, abdominal pain, fecal incontinence Genitourinary:  No dysuria, urinary retention or frequency Musculoskeletal:  No neck pain, back pain Integumentary: No rash, pruritus, skin lesions Neurological: as above Psychiatric: No depression, insomnia, anxiety Endocrine: No palpitations, fatigue, diaphoresis, mood swings, change in appetite, change in weight, increased thirst Hematologic/Lymphatic:  No purpura, petechiae. Allergic/Immunologic: no itchy/runny eyes, nasal congestion, recent allergic reactions, rashes  PHYSICAL EXAM: Vitals:   08/10/16 0735  BP: 130/74  Pulse: 92   General: No acute distress.  Patient appears well-groomed.  normal body habitus. Head:  Normocephalic/atraumatic Eyes:  Fundi examined but not visualized Neck: supple, no paraspinal tenderness, full range of motion Heart:  Regular rate and rhythm Lungs:  Clear to auscultation bilaterally Back: No paraspinal tenderness Neurological Exam: alert and oriented to person, place, and time. Attention span and concentration intact, recent and remote memory intact, fund of knowledge intact.  Speech fluent and not  dysarthric, language intact.  CN II-XII intact. Bulk and tone normal, muscle strength 5/5 throughout.  Sensation to light touch  intact.  Deep tendon reflexes 2+ throughout.  Finger to nose testing intact.  Gait normal  IMPRESSION: Chronic tension-type headache Dizziness.  Unclear etiology.  No neurologic cause found. Small cerebral aneurysm, incidental finding Tobacco use  PLAN: 1.  Increase nortriptyline to 100mg  at bedtime.  She is to contact us in 4 to 6 weeks with update and if not improved, will switch to topiramate (side effects discussed). 2.  Limit use of Tylenol to no more than 2 days out of the week 3.  Advised to increase water intake, stop drinking soda, monitor triggers, quit smoking and start exercise. 4.  MRA of head in August 2018 to follow up on aneurysm. 5.  Follow up in 4 months.  26 minutes spent face to face with  patient, over 50% spent counseliing.  Metta Clines, DO  CC:  Debbrah Alar, NP

## 2016-09-09 ENCOUNTER — Other Ambulatory Visit: Payer: Self-pay | Admitting: Neurology

## 2016-12-11 ENCOUNTER — Ambulatory Visit: Payer: PRIVATE HEALTH INSURANCE | Admitting: Neurology

## 2016-12-14 ENCOUNTER — Telehealth: Payer: Self-pay | Admitting: Neurology

## 2016-12-14 ENCOUNTER — Encounter: Payer: Self-pay | Admitting: Neurology

## 2016-12-15 NOTE — Telephone Encounter (Signed)
Patient dismissed from [facility] by Metta Clines DO , effective December 14, 2016. Dismissal letter sent out by certified / registered mail.  DAJ

## 2016-12-25 NOTE — Telephone Encounter (Signed)
Received signed domestic return receipt verifying delivery of certified letter on December 22, 2016. Article number [id number] [place]

## 2017-02-21 IMAGING — CT CT ANGIO HEAD
1 of 9 series · 12 of 47 positions shown · IV contrast ([ID] ISOVUE [id number])
Comparison: CT 03/24/2016.  MRI 01/30/2015.

CLINICAL DATA: Dizziness. Pulsatile tinnitus. Disorientation.
Duration of symptoms 3 years.

Creatinine was obtained on site at [HOSPITAL] at [REDACTED].Results: Creatinine 0.8 mg/dL.
EXAM:
CT ANGIOGRAPHY HEAD
TECHNIQUE: Multidetector CT imaging of the head was performed using the
standard protocol during bolus administration of intravenous
contrast. Multiplanar CT image reconstructions and MIPs were
obtained to evaluate the vascular anatomy.
CONTRAST:  100 cc Isovue 370

[Series 604: axial thin · axial · 0.49mm/px · z∈[+128,+268]mm · 12 of 166 slices shown]
[im 13/166  brain]
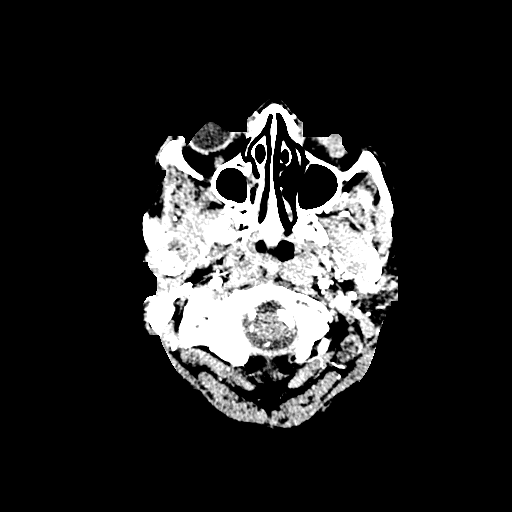
[im 26/166  bone]
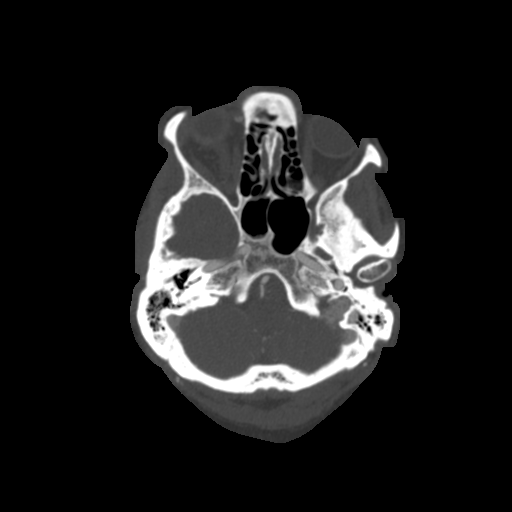
[im 39/166  brain]
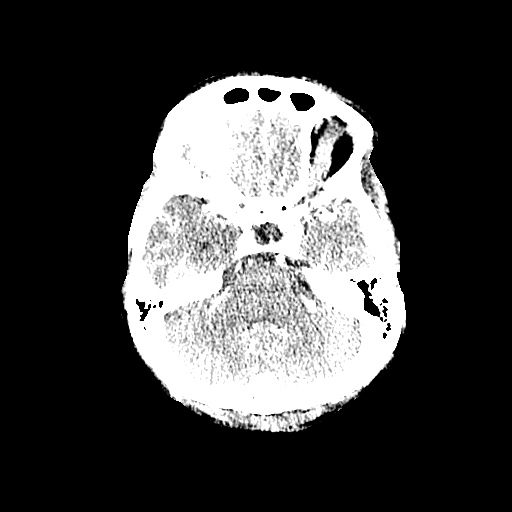
[im 51/166  bone]
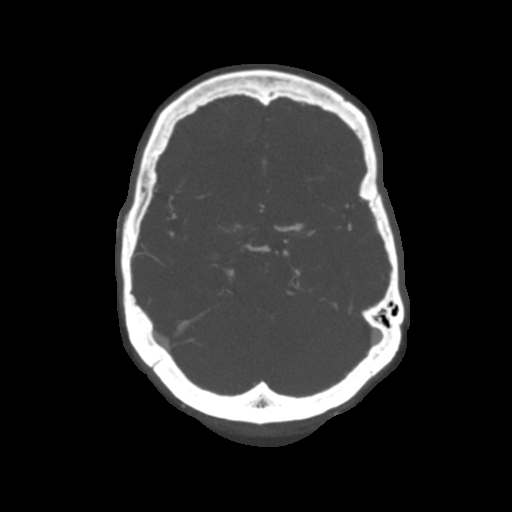
[im 64/166  brain]
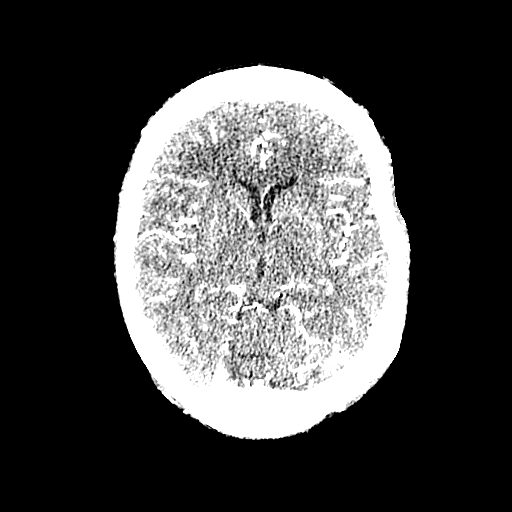
[im 77/166  bone]
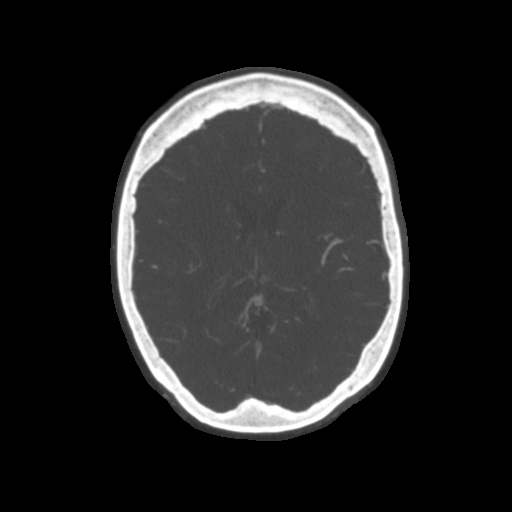
[im 89/166  brain]
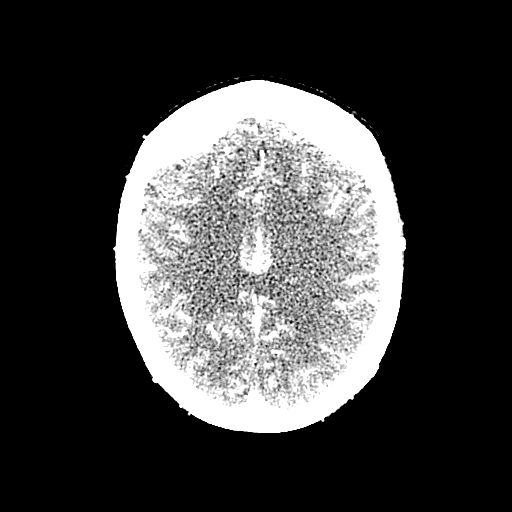
[im 102/166  bone]
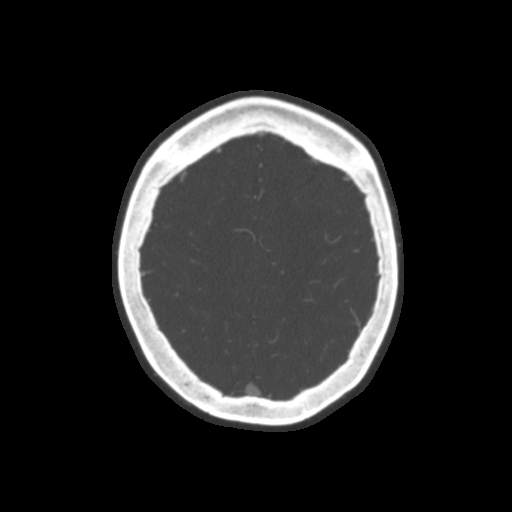
[im 115/166  brain]
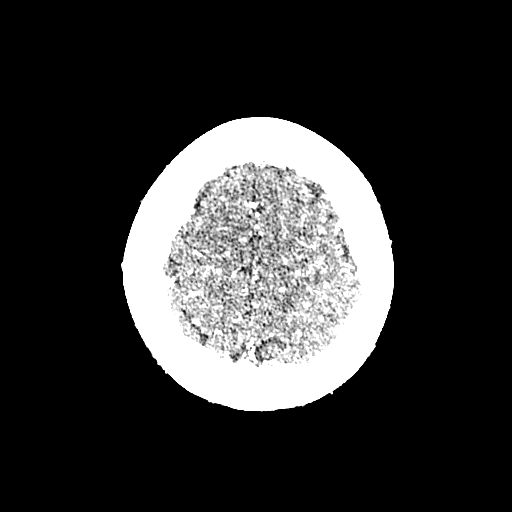
[im 127/166  bone]
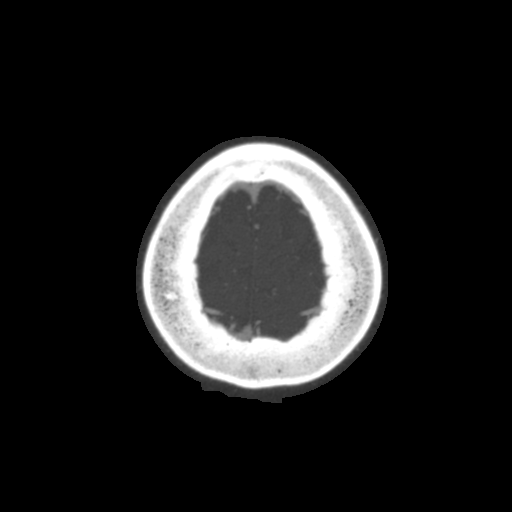
[im 140/166  brain]
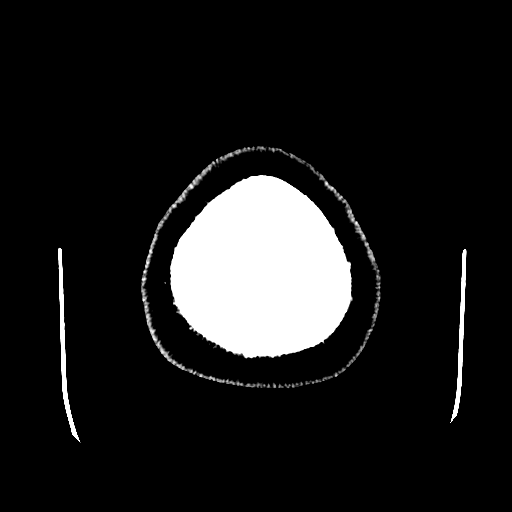
[im 153/166  bone]
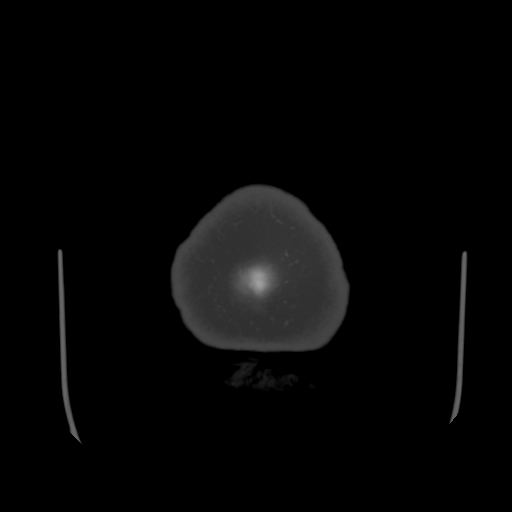

[12 of 47 positions shown; findings below may reference images not displayed]

FINDINGS: CT HEAD

The brain has normal appearance without evidence of old or acute
infarction, mass lesion, hemorrhage, hydrocephalus or extra-axial
collection. No abnormal contrast enhancement.

CTA HEAD

Anterior circulation: Both internal carotid arteries are patent
through the skullbase. There is atherosclerotic calcification
affecting the carotid siphon regions bilaterally. No stenosis
greater than about 20%. Wide mouth aneurysm projecting posteriorly
from the supraclinoid ICA on the right measuring 3.5 mm in size. The
anterior and middle cerebral vessels are patent bilaterally without
stenosis, aneurysm or vascular malformation.

Posterior circulation: Both vertebral arteries are patent with the
left being dominant. No basilar stenosis. Posterior circulation
branch vessels are normal.

Venous sinuses: Patent and normal

Anatomic variants:  None significant

Delayed phase:No abnormal enhancement
IMPRESSION: Atherosclerotic disease affecting both carotid siphons. No stenosis
greater than 20%.

3.5 mm wide mouth aneurysm projecting posteriorly from the
supraclinoid ICA on the right. No other aneurysm. Likely incidental
finding.

No posterior circulation pathology evident.

## 2017-09-06 ENCOUNTER — Emergency Department (HOSPITAL_BASED_OUTPATIENT_CLINIC_OR_DEPARTMENT_OTHER)
Admission: EM | Admit: 2017-09-06 | Discharge: 2017-09-06 | Disposition: A | Discharge status: Home / Self Care | Payer: 59 | Attending: Physician Assistant | Admitting: Physician Assistant

## 2017-09-06 ENCOUNTER — Emergency Department (HOSPITAL_BASED_OUTPATIENT_CLINIC_OR_DEPARTMENT_OTHER): Payer: 59

## 2017-09-06 ENCOUNTER — Other Ambulatory Visit: Payer: Self-pay

## 2017-09-06 ENCOUNTER — Encounter (HOSPITAL_BASED_OUTPATIENT_CLINIC_OR_DEPARTMENT_OTHER): Payer: Self-pay

## 2017-09-06 DIAGNOSIS — R51 Headache: Secondary | ICD-10-CM | POA: Insufficient documentation

## 2017-09-06 DIAGNOSIS — Z79899 Other long term (current) drug therapy: Secondary | ICD-10-CM | POA: Insufficient documentation

## 2017-09-06 DIAGNOSIS — R519 Headache, unspecified: Secondary | ICD-10-CM

## 2017-09-06 DIAGNOSIS — F17210 Nicotine dependence, cigarettes, uncomplicated: Secondary | ICD-10-CM | POA: Diagnosis not present

## 2017-09-06 MED ORDER — PROCHLORPERAZINE EDISYLATE 5 MG/ML IJ SOLN
10.0000 mg | Freq: Once | INTRAMUSCULAR | Status: AC
  Administered 2017-09-06: 10 mg via INTRAVENOUS
  Filled 2017-09-06: qty 2

## 2017-09-06 MED ORDER — DIPHENHYDRAMINE HCL 25 MG PO CAPS
25.0000 mg | ORAL_CAPSULE | Freq: Once | ORAL | Status: AC
  Administered 2017-09-06: 25 mg via ORAL
  Filled 2017-09-06: qty 1

## 2017-09-06 NOTE — ED Provider Notes (Signed)
[place] EMERGENCY DEPARTMENT Provider Note   CSN: [id number] Arrival date & time: 09/06/17  1430     History   Chief Complaint Chief Complaint  Patient presents with  . Headache    HPI Jaime Harris is a 61 y.o. female with a history of chronic tension type headaches who presents to the emergency department today for headache.  Patient notes that she has been getting daily headaches over the last 2 weeks that she describes as a burning sensation in a headband fashion around her head. The headaches are relieved by advil and tylenol usually. Yesterday however she has the gradual onset of headache as typical but it has not yet gone away. She says that the headache has gradually worsened to now 8/10. Patient previously followed by Dr. Tomi Likens of [facility]. Last office visit 08/10/16.  Past the patient has been on nortriptyline 100mg  at bedtime.  She was dismissed from the neurologist office earlier in the years that she has not been taking this for several months.  His previous notes states that she has daily headaches for the last 3 years.  She did have a CTA of the head on 04/07/2016 that showed a incidental 3.5 mm aneurysm from the right supraclinoid ICA. MRI's and CT scans previously done otherwise reassuring. Patient says this HA feels like her typical HA. Not first HA. Not worst HA of life. Denies thunderclap onset. No fever, syncope, head trauma, photophobia, phonophobia, UL throbbing pain, N/V, visual changes, diplopia, vertigo, dizziness, stiff neck, neck pain, extremity numbness/tingiling/weakness, gait difficulty or problems with speech.   HPI  Past Medical History:  Diagnosis Date  . Chronic tension headaches   . Headache   . Hyperlipidemia    borderline  . Sickle cell trait ([place])   . Thyroid disease    enlarged thyroid  . Vertigo   . Vitamin D deficiency     Patient Active Problem List   Diagnosis Date Noted  . Headache 04/02/2015  . Chronic  tension-type headache, intractable 01/26/2015  . Orthostatic hypotension 01/26/2015  . Vitamin D deficiency 12/30/2014  . Migraine 12/25/2014  . Dizziness 12/25/2014  . Tobacco abuse 12/25/2014  . Carpal tunnel syndrome 12/25/2014  . Preventative health care 12/25/2014  . Thyromegaly 11/16/2014  . BPV (benign positional vertigo) 11/16/2014  . Tension headache 11/16/2014  . IBS (irritable bowel syndrome) 11/16/2014    Past Surgical History:  Procedure Laterality Date  . FOOT SURGERY Bilateral    Pt states she had bones removed from the sides of her feet and middle toes so she could walk better.  . TONSILLECTOMY  1975  . TUBAL LIGATION      OB History    No data available       Home Medications    Prior to Admission medications   Medication Sig Start Date End Date Taking? Authorizing Provider  Multiple Vitamin (MULTIVITAMIN) tablet Take 1 tablet by mouth daily.    [provider]  nortriptyline (PAMELOR) 50 MG capsule TAKE 2 CAPSULES (100 MG TOTAL) BY MOUTH AT BEDTIME. 09/12/16   Tomi Likens, Adam R, DO  Vitamin D, Ergocalciferol, (DRISDOL) 50000 units CAPS capsule Take 1 capsule (50,000 Units total) by mouth every 7 (seven) days. 04/24/16   Debbrah Alar, NP    Family History Family History  Problem Relation Age of Onset  . Heart disease Mother        Investment banker, corporate  . Diabetes Mother   . Heart disease Paternal Grandmother  died of CHF  . Diabetes Paternal Grandmother   . Diabetes Brother   . Cancer Neg Hx   . Kidney disease Neg Hx   . Colon cancer Neg Hx   . Colon polyps Neg Hx   . Rectal cancer Neg Hx   . Stomach cancer Neg Hx     Social History Social History   Tobacco Use  . Smoking status: Current Every Day Smoker    Packs/day: 0.50    Types: Cigarettes  . Smokeless tobacco: Never Used  Substance Use Topics  . Alcohol use: Yes    Comment: occasional  . Drug use: No     Allergies   Patient has no known  allergies.   Review of Systems Review of Systems  All other systems reviewed and are negative.    Physical Exam Updated Vital Signs BP (!) 149/88 (BP Location: Left Arm)   Pulse 99   Temp 97.8 F (36.6 C) (Oral)   Resp 18   Ht 5\' 5"  (1.651 m)   Wt 81.6 kg (179 lb 14.3 oz)   SpO2 100%   BMI 29.94 kg/m   Physical Exam  Constitutional: She appears well-developed and well-nourished.  HENT:  Head: Normocephalic and atraumatic.  Right Ear: External ear normal.  Left Ear: External ear normal.  Nose: Nose normal.  Mouth/Throat: Uvula is midline, oropharynx is clear and moist and mucous membranes are normal. No tonsillar exudate.  Eyes: Conjunctivae are normal. Pupils are equal, round, and reactive to light. Right eye exhibits no discharge. Left eye exhibits no discharge. No scleral icterus.  Neck: Trachea normal and normal range of motion. Neck supple. No JVD present. No spinous process tenderness present. No neck rigidity. Normal range of motion present.  Cardiovascular: Normal rate, regular rhythm and intact distal pulses.  No murmur heard. Pulses:      Radial pulses are 2+ on the right side, and 2+ on the left side.       Dorsalis pedis pulses are 2+ on the right side, and 2+ on the left side.       Posterior tibial pulses are 2+ on the right side, and 2+ on the left side.  No lower extremity swelling or edema. Calves symmetric in size bilaterally.  Pulmonary/Chest: Effort normal and breath sounds normal. No respiratory distress. She exhibits no tenderness.  Abdominal: There is no rebound.  Musculoskeletal: She exhibits no edema.  Lymphadenopathy:    She has no cervical adenopathy.  Neurological: She is alert.  Mental Status:  Alert, oriented, thought content appropriate, able to give a coherent history. Speech fluent without evidence of aphasia. Able to follow 2 step commands without difficulty.  Cranial Nerves:  II:  Peripheral visual fields grossly normal, pupils equal,  round, reactive to light III,IV, VI: ptosis not present, extra-ocular motions intact bilaterally  V,VII: smile symmetric, eyebrows raise symmetric, facial light touch sensation equal VIII: hearing grossly normal to voice  X: uvula elevates symmetrically  XI: bilateral shoulder shrug symmetric and strong XII: midline tongue extension without fassiculations Motor:  Normal tone. 5/5 in upper and lower extremities bilaterally including strong and equal grip strength and dorsiflexion/plantar flexion Sensory: Sensation intact to light touch in all extremities. Negative Romberg.  Deep Tendon Reflexes: 2+ and symmetric in the biceps and patella Cerebellar: normal finger-to-nose with bilateral upper extremities. Normal heel-to -shin balance bilaterally of the lower extremity. No pronator drift.  Gait: normal gait and balance CV: distal pulses palpable throughout   Skin: Skin is  warm and dry. No rash noted. She is not diaphoretic. No pallor.  Psychiatric: She has a normal mood and affect.  Nursing note and vitals reviewed.    ED Treatments / Results  Labs (all labs ordered are listed, but only abnormal results are displayed) Labs Reviewed - No data to display  EKG  EKG Interpretation None       Radiology Ct Head Wo Contrast  Result Date: 09/06/2017 CLINICAL DATA:  Generalized headache for 2 days, history of migraines, small aneurysm at RIGHT supraclinoid ICA by prior CTA exam EXAM: CT HEAD WITHOUT CONTRAST TECHNIQUE: Contiguous axial images were obtained from the base of the skull through the vertex without intravenous contrast. Sagittal and coronal MPR images reconstructed from axial data set. COMPARISON:  CTA head 04/07/2016 FINDINGS: Brain: Normal ventricular morphology. No midline shift or mass effect. Normal appearance of brain parenchyma. No intracranial hemorrhage, mass lesion, or evidence of acute infarction. No extra-axial fluid collections. Vascular: Unremarkable Skull: Intact  Sinuses/Orbits: Air-fluid level RIGHT sphenoid sinus mucosal thickening RIGHT sphenoid sinus. Remaining visualized paranasal sinuses and mastoid air cells clear Other: N/A IMPRESSION: No acute intracranial abnormalities. Air-fluid level in the RIGHT sphenoid sinus with associated mucosal thickening, question sinusitis. Electronically Signed   By: Lavonia Dana M.D.   On: 09/06/2017 18:05    Procedures Procedures (including critical care time)  Medications Ordered in ED Medications  prochlorperazine (COMPAZINE) injection 10 mg (10 mg Intravenous Given 09/06/17 1742)  diphenhydrAMINE (BENADRYL) capsule 25 mg (25 mg Oral Given 09/06/17 1742)     Initial Impression / Assessment and Plan / ED Course  I have reviewed the triage vital signs and the nursing notes.  Pertinent labs & imaging results that were available during my care of the patient were reviewed by me and considered in my medical decision making (see chart for details).     61 year old female with a history of chronic tension type headaches previously followed by neurology presenting for daily headaches over the last 2 weeks with most recent episode beginning yesterday.  She describes this as a burning sensation and a headband fashion around her head.  Patient has not been seen by her neurologist in the last year.  She did have a CTA of the head on 04/07/2016 that showed a incidental 3.5 mm aneurysm from the right supraclinoid ICA.  On exam the patient is neurologically intact without any focal neurologic deficits.  Will order CT of the head due to the patient's history of aneurysm.  Will treat with Compazine and Benadryl.  Patient's headache resolved with treatment. Patient requesting to go home.  I advised the patient that in order to ensure that patient does not have rupture of aneurysm a LP would need to be done.  She understands this and deferred LP at the present time.  The risks and benefits of this.  Patient will follow with PCP a  week.  She was given strict return precautions.  She appears safe for discharge.  Final Clinical Impressions(s) / ED Diagnoses   Final diagnoses:  Bad headache    ED Discharge Orders    None       Lorelle Gibbs 09/07/17 0127    Macarthur Critchley, MD 09/09/17 2101

## 2017-09-06 NOTE — ED Notes (Signed)
Pt verbalizes understanding of d/c instructions and denies any further needs at this time. 

## 2017-09-06 NOTE — ED Notes (Signed)
amb to BR 

## 2017-09-06 NOTE — ED Notes (Signed)
Pt reports feeling "a lot better", pt states she had an emergency phone call and needs to leave. EDP made aware and at bedside.

## 2017-09-06 NOTE — ED Notes (Signed)
ED Provider at bedside. 

## 2017-09-06 NOTE — ED Triage Notes (Addendum)
Pt reports 10/10 HA since yesterday. Pt denies dizziness, visual changes and n/v. No neuro deficits. Pt A+OX4.

## 2017-09-06 NOTE — Discharge Instructions (Signed)
Please read and follow all provided instructions.  Your diagnoses today include:  1. Bad headache     Tests performed today include: CT of your head which was normal and did not show any serious cause of your headache Vital signs. See below for your results today.   You were offered an lumbar puncture to evaluate your headache but deferred at this time.  If your headache persists or does not get better your may return for further evaluation.  Medications:  In the Emergency Department you received: Compazine - antinausea/headache medication Benadryl - antihistamine to counteract potential side effects of reglan  Take any prescribed medications only as directed.  Additional information:  Follow any educational materials contained in this packet.  You are having a headache. No specific cause was found today for your headache. It may have been a migraine or other cause of headache. Stress, anxiety, fatigue, and depression are common triggers for headaches.   Your headache today does not appear to be life-threatening or require hospitalization, but often the exact cause of headaches is not determined in the emergency department. Therefore, follow-up with your doctor is very important to find out what may have caused your headache and whether or not you need any further diagnostic testing or treatment.   Sometimes headaches can appear benign (not harmful), but then more serious symptoms can develop which should prompt an immediate re-evaluation by your doctor or the emergency department.  BE VERY CAREFUL not to take multiple medicines containing Tylenol (also called acetaminophen). Doing so can lead to an overdose which can damage your liver and cause liver failure and possibly death.   Follow-up instructions: Please follow-up with your primary care provider in the next 3 days for further evaluation of your symptoms.   Return instructions:  Please return to the Emergency Department if you  experience worsening symptoms. Return if the medications do not resolve your headache, if it recurs, or if you have multiple episodes of vomiting or cannot keep down fluids. Return if you have a change from the usual headache. RETURN IMMEDIATELY IF you: Develop a sudden, severe headache Develop confusion or become poorly responsive or faint Develop a fever above 100.6F or problem breathing Have a change in speech, vision, swallowing, or understanding Develop new weakness, numbness, tingling, incoordination in your arms or legs Have a seizure Please return if you have any other emergent concerns.  Additional Information:  Your vital signs today were: BP (!) 143/71 (BP Location: Left Arm)    Pulse 86    Temp 97.8 F (36.6 C) (Oral)    Resp 18    Ht 5\' 5"  (1.651 m)    Wt 81.6 kg (179 lb 14.3 oz)    SpO2 100%    BMI 29.94 kg/m  If your blood pressure (BP) was elevated above 135/85 this visit, please have this repeated by your doctor within one month. --------------

## 2017-10-10 ENCOUNTER — Other Ambulatory Visit: Payer: Self-pay

## 2017-10-10 ENCOUNTER — Emergency Department (HOSPITAL_BASED_OUTPATIENT_CLINIC_OR_DEPARTMENT_OTHER): Payer: 59

## 2017-10-10 ENCOUNTER — Encounter (HOSPITAL_BASED_OUTPATIENT_CLINIC_OR_DEPARTMENT_OTHER): Payer: Self-pay

## 2017-10-10 ENCOUNTER — Emergency Department (HOSPITAL_BASED_OUTPATIENT_CLINIC_OR_DEPARTMENT_OTHER)
Admission: EM | Admit: 2017-10-10 | Discharge: 2017-10-10 | Disposition: A | Discharge status: Home / Self Care | Payer: 59 | Attending: Emergency Medicine | Admitting: Emergency Medicine

## 2017-10-10 DIAGNOSIS — R0981 Nasal congestion: Secondary | ICD-10-CM | POA: Diagnosis not present

## 2017-10-10 DIAGNOSIS — M791 Myalgia, unspecified site: Secondary | ICD-10-CM | POA: Diagnosis not present

## 2017-10-10 DIAGNOSIS — J111 Influenza due to unidentified influenza virus with other respiratory manifestations: Secondary | ICD-10-CM | POA: Insufficient documentation

## 2017-10-10 DIAGNOSIS — E079 Disorder of thyroid, unspecified: Secondary | ICD-10-CM | POA: Diagnosis not present

## 2017-10-10 DIAGNOSIS — R110 Nausea: Secondary | ICD-10-CM | POA: Insufficient documentation

## 2017-10-10 DIAGNOSIS — M79602 Pain in left arm: Secondary | ICD-10-CM | POA: Diagnosis not present

## 2017-10-10 DIAGNOSIS — F17210 Nicotine dependence, cigarettes, uncomplicated: Secondary | ICD-10-CM | POA: Insufficient documentation

## 2017-10-10 DIAGNOSIS — Z79899 Other long term (current) drug therapy: Secondary | ICD-10-CM | POA: Insufficient documentation

## 2017-10-10 DIAGNOSIS — R6889 Other general symptoms and signs: Secondary | ICD-10-CM

## 2017-10-10 DIAGNOSIS — R072 Precordial pain: Secondary | ICD-10-CM | POA: Diagnosis not present

## 2017-10-10 DIAGNOSIS — R05 Cough: Secondary | ICD-10-CM | POA: Diagnosis present

## 2017-10-10 DIAGNOSIS — R079 Chest pain, unspecified: Secondary | ICD-10-CM | POA: Diagnosis not present

## 2017-10-10 LAB — CBC
HCT: 38.8 % (ref 36.0–46.0)
Hemoglobin: 13.4 g/dL (ref 12.0–15.0)
MCH: 29.1 pg (ref 26.0–34.0)
MCHC: 34.5 g/dL (ref 30.0–36.0)
MCV: 84.2 fL (ref 78.0–100.0)
Platelets: 184 10*3/uL (ref 150–400)
RBC: 4.61 MIL/uL (ref 3.87–5.11)
RDW: 14.6 % (ref 11.5–15.5)
WBC: 7.4 10*3/uL (ref 4.0–10.5)

## 2017-10-10 LAB — BASIC METABOLIC PANEL
ANION GAP: 9 (ref 5–15)
BUN: 11 mg/dL (ref 6–20)
CALCIUM: 9.2 mg/dL (ref 8.9–10.3)
CO2: 26 mmol/L (ref 22–32)
Chloride: 104 mmol/L (ref 101–111)
Creatinine, Ser: 0.85 mg/dL (ref 0.44–1.00)
GFR calc Af Amer: >60 mL/min (ref >60)
Glucose, Bld: 82 mg/dL (ref 65–99)
POTASSIUM: 3.9 mmol/L (ref 3.5–5.1)
SODIUM: 139 mmol/L (ref 135–145)

## 2017-10-10 LAB — TROPONIN I: Troponin I: <0.03 ng/mL (ref <0.03)

## 2017-10-10 MED ORDER — SODIUM CHLORIDE 0.9 % IV BOLUS (SEPSIS)
1000.0000 mL | Freq: Once | INTRAVENOUS | Status: AC
  Administered 2017-10-10: 1000 mL via INTRAVENOUS

## 2017-10-10 MED ORDER — SODIUM CHLORIDE 0.9 % IV BOLUS (SEPSIS): 1000.0000 mL | Freq: Once | INTRAVENOUS | Status: DC

## 2017-10-10 MED ORDER — DM-GUAIFENESIN ER 30-600 MG PO TB12: 1.0000 | ORAL_TABLET | Freq: Two times a day (BID) | ORAL | 1 refills | Status: DC

## 2017-10-10 NOTE — ED Notes (Signed)
Pt c/o productive cough for three days, heavy smoker, last took OTC cold medication last night

## 2017-10-10 NOTE — ED Provider Notes (Signed)
[place] EMERGENCY DEPARTMENT Provider Note   CSN: [id number] Arrival date & time: 10/10/17  1439     History   Chief Complaint Chief Complaint  Patient presents with  . Cough    HPI Jaime Harris is a 62 y.o. female.  Patient with onset of flulike symptoms 3 days ago.  This is day #3 of symptoms.  Patient with cough occasionally productive cough congestion body aches.  Nausea but no vomiting or diarrhea.  No fevers.  Patient did not have the influenza vaccine.  [facility]  She also this morning developed left-sided chest pain sharp for about 2 minutes and then kind of a burning sensation.  And had pain radiating to left arm.  Based on these findings [facility] sent her here for further evaluation.  Patient without any known cardiac disease.  Risk factors would include hyperlipidemia and she is a smoker.  Patient just now with just a burning sensation left anterior chest.  No sharp pain.      Past Medical History:  Diagnosis Date  . Chronic tension headaches   . Headache   . Hyperlipidemia    borderline  . Sickle cell trait ([place])   . Thyroid disease    enlarged thyroid  . Vertigo   . Vitamin D deficiency     Patient Active Problem List   Diagnosis Date Noted  . Headache 04/02/2015  . Chronic tension-type headache, intractable 01/26/2015  . Orthostatic hypotension 01/26/2015  . Vitamin D deficiency 12/30/2014  . Migraine 12/25/2014  . Dizziness 12/25/2014  . Tobacco abuse 12/25/2014  . Carpal tunnel syndrome 12/25/2014  . Preventative health care 12/25/2014  . Thyromegaly 11/16/2014  . BPV (benign positional vertigo) 11/16/2014  . Tension headache 11/16/2014  . IBS (irritable bowel syndrome) 11/16/2014    Past Surgical History:  Procedure Laterality Date  . FOOT SURGERY Bilateral    Pt states she had bones removed from the sides of her feet and middle toes so she could walk better.  . TONSILLECTOMY  1975  . TUBAL LIGATION       OB History    No data available       Home Medications    Prior to Admission medications   Medication Sig Start Date End Date Taking? Authorizing Provider  dextromethorphan-guaiFENesin (MUCINEX DM) 30-600 MG 12hr tablet Take 1 tablet by mouth 2 (two) times daily. 10/10/17   Fredia Sorrow, MD  Multiple Vitamin (MULTIVITAMIN) tablet Take 1 tablet by mouth daily.    [provider]  nortriptyline (PAMELOR) 50 MG capsule TAKE 2 CAPSULES (100 MG TOTAL) BY MOUTH AT BEDTIME. 09/12/16   Tomi Likens, Adam R, DO  Vitamin D, Ergocalciferol, (DRISDOL) 50000 units CAPS capsule Take 1 capsule (50,000 Units total) by mouth every 7 (seven) days. 04/24/16   Debbrah Alar, NP    Family History Family History  Problem Relation Age of Onset  . Heart disease Mother        Investment banker, corporate  . Diabetes Mother   . Heart disease Paternal Grandmother        died of CHF  . Diabetes Paternal Grandmother   . Diabetes Brother   . Cancer Neg Hx   . Kidney disease Neg Hx   . Colon cancer Neg Hx   . Colon polyps Neg Hx   . Rectal cancer Neg Hx   . Stomach cancer Neg Hx     Social History Social History   Tobacco Use  . Smoking  status: Current Every Day Smoker    Packs/day: 0.50    Types: Cigarettes  . Smokeless tobacco: Never Used  Substance Use Topics  . Alcohol use: Yes    Comment: occasional  . Drug use: No     Allergies   Patient has no known allergies.   Review of Systems Review of Systems  Constitutional: Positive for fatigue. Negative for fever.  HENT: Positive for congestion and voice change.   Eyes: Negative for visual disturbance.  Respiratory: Positive for cough.   Cardiovascular: Positive for chest pain.  Gastrointestinal: Positive for nausea. Negative for abdominal pain, diarrhea and vomiting.  Genitourinary: Negative for dysuria.  Musculoskeletal: Positive for myalgias. Negative for neck stiffness.  Skin: Negative for rash.  Neurological: Positive for  headaches.  Hematological: Does not bruise/bleed easily.  Psychiatric/Behavioral: Negative for confusion.     Physical Exam Updated Vital Signs BP 125/74   Pulse 94   Temp 99 F (37.2 C) (Oral)   Resp 17   Ht 1.651 m (5\' 5" )   Wt 82 kg (180 lb 12.4 oz)   SpO2 97%   BMI 30.08 kg/m   Physical Exam  Constitutional: She is oriented to person, place, and time. She appears well-developed and well-nourished. No distress.  HENT:  Head: Normocephalic and atraumatic.  Mouth/Throat: Oropharynx is clear and moist.  Eyes: Conjunctivae and EOM are normal. Pupils are equal, round, and reactive to light.  Neck: Normal range of motion. Neck supple.  Cardiovascular: Regular rhythm and normal heart sounds.  Slightly tachycardic  Pulmonary/Chest: Effort normal and breath sounds normal. No respiratory distress.  Abdominal: Soft. Bowel sounds are normal.  Musculoskeletal: Normal range of motion. She exhibits no edema.  Neurological: She is alert and oriented to person, place, and time. No cranial nerve deficit or sensory deficit. She exhibits normal muscle tone. Coordination normal.  Skin: Skin is warm. No rash noted.  Nursing note and vitals reviewed.    ED Treatments / Results  Labs (all labs ordered are listed, but only abnormal results are displayed) Labs Reviewed  TROPONIN I  CBC  BASIC METABOLIC PANEL    EKG  EKG Interpretation  Date/Time:  Wednesday October 10 2017 15:38:39 EST Ventricular Rate:  100 PR Interval:    QRS Duration: 88 QT Interval:  327 QTC Calculation: 422 R Axis:   -51 Text Interpretation:  Sinus tachycardia Probable left atrial enlargement Left anterior fascicular block Abnormal R-wave progression, early transition Left ventricular hypertrophy Baseline wander in lead(s) III aVL No significant change since last tracing Confirmed by Fredia Sorrow [id number]) on 10/10/2017 3:42:28 PM Also confirmed by Fredia Sorrow [phone]), editor Lynder Parents [phone])  on  10/10/2017 3:57:04 PM       Radiology Dg Chest 2 View  Result Date: 10/10/2017 CLINICAL DATA:  Cough, shortness of breath and left upper extremity tingling. EXAM: CHEST  2 VIEW COMPARISON:  05/10/2013 FINDINGS: Heart size is normal. Mediastinal shadows are normal. There may be bronchial thickening, but there is no infiltrate, collapse or effusion. No significant bone finding. IMPRESSION: Possible bronchitis.  No consolidation or collapse. Electronically Signed   By: Nelson Chimes M.D.   On: 10/10/2017 16:06    Procedures Procedures (including critical care time)  Medications Ordered in ED Medications  sodium chloride 0.9 % bolus 1,000 mL (1,000 mLs Intravenous New Bag/Given 10/10/17 1644)     Initial Impression / Assessment and Plan / ED Course  I have reviewed the triage vital signs and the nursing notes.  Pertinent labs & imaging results that were available during my care of the patient were reviewed by me and considered in my medical decision making (see chart for details).     Symptoms consistent with influenza.  Patient had symptoms now for 3 days so out of the window for Tamiflu.  Was [facility] from [facility] for concerns for the left-sided chest pain radiating into the arm.  That has been present all day.  Troponin negative.  Labs without significant abnormalities.  Chest x-ray negative for pneumothorax or pneumonia.  Symptoms probably all related to flulike illness.  Will be treated symptomatically.  Patient hydrated here with 1 L of normal saline basic labs without any significant abnormalities.  As mentioned troponin was negative.  EKG had some subtle changes laterally but nothing acute.  Final Clinical Impressions(s) / ED Diagnoses   Final diagnoses:  Flu-like symptoms  Precordial pain    ED Discharge Orders        Ordered    dextromethorphan-guaiFENesin [facility] DM) 30-600 MG 12hr tablet  2 times daily     10/10/17 1731       Fredia Sorrow, MD 10/10/17  1814

## 2017-10-10 NOTE — ED Triage Notes (Signed)
C/o flu like sx day 2-NAD-steady gait 

## 2017-10-10 NOTE — ED Notes (Signed)
EDP is now at bedside

## 2017-10-10 NOTE — Discharge Instructions (Signed)
Prescription provided for Mucinex DM for the cough and fashion.  Workup for the chest pain without any acute findings.  Chest x-ray negative for pneumonia.  Return for any new or worse symptoms.  Follow-up with your doctor if not improving over the next several days.  Work note provided.   Mucinex DM is also available over-the-counter.

## 2017-10-10 NOTE — ED Notes (Signed)
Pt verbalizes understanding of d/c instructions and denies any further needs at this time. 

## 2017-10-10 NOTE — ED Notes (Signed)
Patient transported to X-ray 

## 2018-02-27 ENCOUNTER — Other Ambulatory Visit: Payer: Self-pay | Admitting: Family Medicine

## 2018-02-27 DIAGNOSIS — Z1231 Encounter for screening mammogram for malignant neoplasm of breast: Secondary | ICD-10-CM

## 2018-03-29 ENCOUNTER — Ambulatory Visit
Admission: RE | Admit: 2018-03-29 | Discharge: 2018-03-29 | Disposition: A | Discharge status: Home / Self Care | Payer: 59 | Source: Ambulatory Visit | Attending: Family Medicine | Admitting: Family Medicine

## 2018-03-29 DIAGNOSIS — Z1231 Encounter for screening mammogram for malignant neoplasm of breast: Secondary | ICD-10-CM

## 2018-07-05 ENCOUNTER — Ambulatory Visit: Payer: 59 | Admitting: Family

## 2018-07-05 ENCOUNTER — Encounter: Payer: Self-pay | Admitting: Family

## 2018-07-05 VITALS — BP 130/78 | HR 102 | Temp 98.8°F | Resp 16 | Ht 65.0 in | Wt 178.0 lb

## 2018-07-05 DIAGNOSIS — I720 Aneurysm of carotid artery: Secondary | ICD-10-CM | POA: Diagnosis not present

## 2018-07-05 DIAGNOSIS — R0683 Snoring: Secondary | ICD-10-CM | POA: Diagnosis not present

## 2018-07-05 DIAGNOSIS — G44229 Chronic tension-type headache, not intractable: Secondary | ICD-10-CM

## 2018-07-05 MED ORDER — KETOROLAC TROMETHAMINE 60 MG/2ML IM SOLN
60.0000 mg | Freq: Once | INTRAMUSCULAR | Status: AC
  Administered 2018-07-05: 60 mg via INTRAMUSCULAR

## 2018-07-05 MED ORDER — NORTRIPTYLINE HCL 50 MG PO CAPS: 100.0000 mg | ORAL_CAPSULE | Freq: Every day | ORAL | 2 refills | Status: DC

## 2018-07-05 NOTE — Progress Notes (Signed)
Subjective:    Patient ID: An Lannan, female    DOB: 10-08-1955, 62 y.o.   MRN: [id number]  HPI  Patient is a 62 yr old female who presents today with chief complaint of headache. She has a history of chronic tension headaches. She has not been seen since 2017.  Reports that she had to leave work on Wednesday due to HA. Yesterday she stayed home from work due to ongoing headache.  She has tried tylenol and aleve without improvement.   Reports that she had improvement in her HA's previously with nortriptyline but ran out. She reports some associated photophobia. Denies nausea.  She does report some associated dizziness.  + AM headaches, + snoring, + daytime somnolence.   Carotid aneurysm- noted on imaging 2017.    Review of Systems    see HPI  Past Medical History:  Diagnosis Date  . Chronic tension headaches   . Headache   . Hyperlipidemia    borderline  . Sickle cell trait ([place])   . Thyroid disease    enlarged thyroid  . Vertigo   . Vitamin D deficiency      Social History   Socioeconomic History  . Marital status: Single    Spouse name: Not on file  . Number of children: 3  . Years of education: Not on file  . Highest education level: Not on file  Occupational History    Comment: Cashier  Social Needs  . Financial resource strain: Not on file  . Food insecurity:    Worry: Not on file    Inability: Not on file  . Transportation needs:    Medical: Not on file    Non-medical: Not on file  Tobacco Use  . Smoking status: Current Every Day Smoker    Packs/day: 0.50    Types: Cigarettes  . Smokeless tobacco: Never Used  Substance and Sexual Activity  . Alcohol use: Yes    Comment: occasional  . Drug use: No  . Sexual activity: Not on file  Lifestyle  . Physical activity:    Days per week: Not on file    Minutes per session: Not on file  . Stress: Not on file  Relationships  . Social connections:    Talks on phone: Not on file    Gets together:  Not on file    Attends religious service: Not on file    Active member of club or organization: Not on file    Attends meetings of clubs or organizations: Not on file    Relationship status: Not on file  . Intimate partner violence:    Fear of current or ex partner: Not on file    Emotionally abused: Not on file    Physically abused: Not on file    Forced sexual activity: Not on file  Other Topics Concern  . Not on file  Social History Narrative   Makes eye glasses.     Single,   GED   She has 3 children   13- Son [name]- lives locally- he has one son   39- Daughter [name]- 7 children (4 of these children live with the pt- she is a primary care giver)   [place]- lives in [place] 1 daughter   Enjoys reading    Past Surgical History:  Procedure Laterality Date  . FOOT SURGERY Bilateral    Pt states she had bones removed from the sides of her feet and middle toes so she could walk  better.  . TONSILLECTOMY  1975  . TUBAL LIGATION      Family History  Problem Relation Age of Onset  . Heart disease Mother        Investment banker, corporate  . Diabetes Mother   . Heart disease Paternal Grandmother        died of CHF  . Diabetes Paternal Grandmother   . Diabetes Brother   . Cancer Neg Hx   . Kidney disease Neg Hx   . Colon cancer Neg Hx   . Colon polyps Neg Hx   . Rectal cancer Neg Hx   . Stomach cancer Neg Hx     No Known Allergies  Current Outpatient Medications on File Prior to Visit  Medication Sig Dispense Refill  . Multiple Vitamin (MULTIVITAMIN) tablet Take 1 tablet by mouth daily.     No current facility-administered medications on file prior to visit.     BP 130/78 (BP Location: Left Arm, Patient Position: Sitting, Cuff Size: Small)   Pulse (!) 102   Temp 98.8 F (37.1 C) (Oral)   Resp 16   Ht 5\' 5"  (1.651 m)   Wt 178 lb (80.7 kg)   SpO2 99%   BMI 29.62 kg/m    Objective:   Physical Exam  Constitutional: She appears well-developed and  well-nourished.  Eyes: Pupils are equal, round, and reactive to light. EOM are normal.  Cardiovascular: Normal rate, regular rhythm and normal heart sounds.  No murmur heard. Pulmonary/Chest: Effort normal and breath sounds normal. No respiratory distress. She has no wheezes.  Neurological: She is alert. She has normal strength. No cranial nerve deficit.  Psychiatric: She has a normal mood and affect. Her behavior is normal. Judgment and thought content normal.          Assessment & Plan:  Chronic tension type Headaches- will give IM Toradol in the office. Restart pamelor as this helped her previously.  Snoring- will obtain home sleep study to further evaluate. If she has undiagnosed OSA this could be a contributor to her OSA.  Carotid aneurysm- (3.5 mm wide mouth aneurysm projecting posteriorly from the supraclinoid ICA on the right) this was an incidental finding on CT angio head from 2017. I have advised the patient that I would like her to see vascular for further evaluation/follow up of this. Referral placed.

## 2018-07-05 NOTE — Patient Instructions (Signed)
Please restart nortriptyline.  You should be contacted about your referral to the vascular surgeon to follow up on the aneurysm of your right carotid artery. Call if headache worsens or if headache does not improve.

## 2018-07-05 NOTE — Addendum Note (Signed)
Addended by: Jiles Prows on: 07/05/2018 02:24 PM   Modules accepted: Orders

## 2018-08-02 ENCOUNTER — Encounter: Payer: Self-pay | Admitting: Family

## 2018-08-02 ENCOUNTER — Ambulatory Visit: Payer: 59 | Admitting: Family

## 2018-08-02 VITALS — BP 144/70 | HR 86 | Temp 97.8°F | Resp 16 | Ht 65.0 in | Wt 180.6 lb

## 2018-08-02 DIAGNOSIS — R0683 Snoring: Secondary | ICD-10-CM | POA: Diagnosis not present

## 2018-08-02 DIAGNOSIS — G44209 Tension-type headache, unspecified, not intractable: Secondary | ICD-10-CM

## 2018-08-02 DIAGNOSIS — I720 Aneurysm of carotid artery: Secondary | ICD-10-CM

## 2018-08-02 NOTE — Progress Notes (Signed)
Subjective:    Patient ID: Jaime Harris, female    DOB: Jan 15, 1956, 62 y.o.   MRN: [id number]  HPI  Patient is a 62 yr old female who presents today with chief complaint of headache. She has seen Dr. Loretta Plume (neurology) in the past for tension type headaches. She last saw him in 2017.  Back at that time he recommended that she start nortriptyline 25mg  at bedtime.     Reports last HA was day before yesterday. Having every other day. If she is at work she takes ibuprofen. When she is at home she takes nortryptilline  She takes 3 times a week. Generally goes to bed at 2 PM and then wakes up at 7.    Carotid aneurysm- pt is scheduled to see vascular for consult in December.   Snoring- reports never contacted to schedule home sleep study.   Review of Systems See HPI  Past Medical History:  Diagnosis Date  . Chronic tension headaches   . Headache   . Hyperlipidemia    borderline  . Sickle cell trait ([place])   . Thyroid disease    enlarged thyroid  . Vertigo   . Vitamin D deficiency      Social History   Socioeconomic History  . Marital status: Single    Spouse name: Not on file  . Number of children: 3  . Years of education: Not on file  . Highest education level: Not on file  Occupational History    Comment: Cashier  Social Needs  . Financial resource strain: Not on file  . Food insecurity:    Worry: Not on file    Inability: Not on file  . Transportation needs:    Medical: Not on file    Non-medical: Not on file  Tobacco Use  . Smoking status: Current Every Day Smoker    Packs/day: 0.50    Types: Cigarettes  . Smokeless tobacco: Never Used  Substance and Sexual Activity  . Alcohol use: Yes    Comment: occasional  . Drug use: No  . Sexual activity: Not on file  Lifestyle  . Physical activity:    Days per week: Not on file    Minutes per session: Not on file  . Stress: Not on file  Relationships  . Social connections:    Talks on phone: Not on file    Gets  together: Not on file    Attends religious service: Not on file    Active member of club or organization: Not on file    Attends meetings of clubs or organizations: Not on file    Relationship status: Not on file  . Intimate partner violence:    Fear of current or ex partner: Not on file    Emotionally abused: Not on file    Physically abused: Not on file    Forced sexual activity: Not on file  Other Topics Concern  . Not on file  Social History Narrative   Makes eye glasses.     Single,   GED   She has 3 children   57- Son [name]- lives locally- he has one son   21- Daughter [name]- 7 children (4 of these children live with the pt- she is a primary care giver)   [place]- lives in [place] 1 daughter   Enjoys reading    Past Surgical History:  Procedure Laterality Date  . FOOT SURGERY Bilateral    Pt states she had bones removed from  the sides of her feet and middle toes so she could walk better.  . TONSILLECTOMY  1975  . TUBAL LIGATION      Family History  Problem Relation Age of Onset  . Heart disease Mother        Investment banker, corporate  . Diabetes Mother   . Heart disease Paternal Grandmother        died of CHF  . Diabetes Paternal Grandmother   . Diabetes Brother   . Cancer Neg Hx   . Kidney disease Neg Hx   . Colon cancer Neg Hx   . Colon polyps Neg Hx   . Rectal cancer Neg Hx   . Stomach cancer Neg Hx     No Known Allergies  Current Outpatient Medications on File Prior to Visit  Medication Sig Dispense Refill  . Multiple Vitamin (MULTIVITAMIN) tablet Take 1 tablet by mouth daily.    . nortriptyline (PAMELOR) 50 MG capsule Take 2 capsules (100 mg total) by mouth at bedtime. 60 capsule 2   No current facility-administered medications on file prior to visit.     BP (!) 144/70 (BP Location: Right Arm, Cuff Size: Normal)   Pulse 86   Temp 97.8 F (36.6 C) (Oral)   Resp 16   Ht 5\' 5"  (1.651 m)   Wt 180 lb 9.6 oz (81.9 kg)   SpO2 98%   BMI  30.05 kg/m       Objective:   Physical Exam  Constitutional: She appears well-developed and well-nourished.  Cardiovascular: Normal rate, regular rhythm and normal heart sounds.  No murmur heard. Pulmonary/Chest: Effort normal and breath sounds normal. No respiratory distress. She has no wheezes.  Psychiatric: She has a normal mood and affect. Her behavior is normal. Judgment and thought content normal.          Assessment & Plan:  Tension HA- advised pt to take nortriptyline every evening to prevent HA and to limit use of ibuprofen.  Also advised on the importance of adequate sleep. She is currently only sleeping 5 hrs a night.  Snoring- reports she was never contacted about scheduling her home sleep study. I have given her the number to call.  Carotid aneurysm- encouraged her to keep her upcoming appointment with vascular.

## 2018-08-02 NOTE — Patient Instructions (Addendum)
Please call [facility] to schedule your home sleep study.  [phone] Take Nortriptyline every night at bedtime to help with headache prevention. Avoid overuse of ibuprofen.  Try to get at least 7 hrs of sleep a night to help with your headaches.

## 2018-08-15 ENCOUNTER — Other Ambulatory Visit: Payer: Self-pay

## 2018-08-15 DIAGNOSIS — I6529 Occlusion and stenosis of unspecified carotid artery: Secondary | ICD-10-CM

## 2018-08-28 DIAGNOSIS — G4733 Obstructive sleep apnea (adult) (pediatric): Secondary | ICD-10-CM

## 2018-09-02 ENCOUNTER — Telehealth: Payer: Self-pay | Admitting: *Deleted

## 2018-09-02 ENCOUNTER — Encounter: Payer: 59 | Admitting: Surgery

## 2018-09-02 ENCOUNTER — Inpatient Hospital Stay (HOSPITAL_COMMUNITY): Admission: RE | Admit: 2018-09-02 | Payer: 59 | Source: Ambulatory Visit

## 2018-09-02 DIAGNOSIS — R0683 Snoring: Secondary | ICD-10-CM

## 2018-09-02 DIAGNOSIS — G4733 Obstructive sleep apnea (adult) (pediatric): Secondary | ICD-10-CM

## 2018-09-02 NOTE — Telephone Encounter (Signed)
Jaime Harris -- please see future order and let me know if there is anything else needed.  Parke Poisson  Fri 08/30/2018 5:39 PM  Marin Roberts;  Kelle Darting ?  Good evening ladies, Would one of you mind reordering this Home Sleep Test and make it 'future'?

## 2018-09-03 ENCOUNTER — Encounter: Payer: Self-pay | Admitting: Surgery

## 2018-09-09 ENCOUNTER — Other Ambulatory Visit: Payer: Self-pay | Admitting: *Deleted

## 2018-09-09 DIAGNOSIS — R0683 Snoring: Secondary | ICD-10-CM

## 2018-09-11 DIAGNOSIS — I729 Aneurysm of unspecified site: Secondary | ICD-10-CM

## 2018-09-11 HISTORY — DX: Aneurysm of unspecified site: I72.9

## 2018-10-28 ENCOUNTER — Ambulatory Visit (HOSPITAL_COMMUNITY)
Admission: RE | Admit: 2018-10-28 | Discharge: 2018-10-28 | Disposition: A | Discharge status: Home / Self Care | Payer: 59 | Source: Ambulatory Visit | Attending: Surgery | Admitting: Surgery

## 2018-10-28 ENCOUNTER — Other Ambulatory Visit: Payer: Self-pay

## 2018-10-28 ENCOUNTER — Encounter (HOSPITAL_COMMUNITY): Payer: 59

## 2018-10-28 ENCOUNTER — Encounter: Payer: Self-pay | Admitting: Surgery

## 2018-10-28 ENCOUNTER — Ambulatory Visit (INDEPENDENT_AMBULATORY_CARE_PROVIDER_SITE_OTHER): Payer: 59 | Admitting: Surgery

## 2018-10-28 VITALS — BP 132/84 | HR 83 | Temp 97.6°F | Resp 14 | Ht 65.0 in | Wt 179.0 lb

## 2018-10-28 DIAGNOSIS — I6529 Occlusion and stenosis of unspecified carotid artery: Secondary | ICD-10-CM

## 2018-10-28 NOTE — Progress Notes (Signed)
[facility]ist [facility]  Patient name: Nicoletta Hush MRN: [id number] DOB: 1956-03-16 Sex: female   REQUESTING PROVIDER:    Debbrah Alar   REASON FOR CONSULT:    Carotid stenosis  HISTORY OF PRESENT ILLNESS:   Kaleiyah Polsky is a 63 y.o. female, who is referred for evaluation of carotid aneurysm.  This was detected in 2017 on a CT scan.  It was a 3.5 mm aneurysm within the supraclinoid right internal carotid artery.  She has not had any follow-up.  She denies any neurological changes.  Specifically she denies numbness or weakness in either extremity.  She denies slurred speech.  She denies amaurosis fugax.  The patient does suffer from chronic tension type headaches.  She is also being evaluated for sleep apnea.  She has sickle cell trait and borderline hyperlipidemia.  She is a current smoker.  PAST MEDICAL HISTORY    Past Medical History:  Diagnosis Date  . Chronic tension headaches   . Headache   . Hyperlipidemia    borderline  . Sickle cell trait ([place])   . Thyroid disease    enlarged thyroid  . Vertigo   . Vitamin D deficiency      FAMILY HISTORY   Family History  Problem Relation Age of Onset  . Heart disease Mother        Investment banker, corporate  . Diabetes Mother   . Heart disease Paternal Grandmother        died of CHF  . Diabetes Paternal Grandmother   . Diabetes Brother   . Cancer Neg Hx   . Kidney disease Neg Hx   . Colon cancer Neg Hx   . Colon polyps Neg Hx   . Rectal cancer Neg Hx   . Stomach cancer Neg Hx     SOCIAL HISTORY:   Social History   Socioeconomic History  . Marital status: Single    Spouse name: Not on file  . Number of children: 3  . Years of education: Not on file  . Highest education level: Not on file  Occupational History    Comment: Cashier  Social Needs  . Financial resource strain: Not on file  . Food insecurity:    Worry: Not on file    Inability: Not  on file  . Transportation needs:    Medical: Not on file    Non-medical: Not on file  Tobacco Use  . Smoking status: Current Every Day Smoker    Packs/day: 0.50    Types: Cigarettes  . Smokeless tobacco: Never Used  Substance and Sexual Activity  . Alcohol use: Yes    Comment: occasional  . Drug use: No  . Sexual activity: Not on file  Lifestyle  . Physical activity:    Days per week: Not on file    Minutes per session: Not on file  . Stress: Not on file  Relationships  . Social connections:    Talks on phone: Not on file    Gets together: Not on file    Attends religious service: Not on file    Active member of club or organization: Not on file    Attends meetings of clubs or organizations: Not on file    Relationship status: Not on file  . Intimate partner violence:    Fear of current or ex partner: Not on file    Emotionally abused: Not on file    Physically abused: Not on file    Forced sexual activity: Not  on file  Other Topics Concern  . Not on file  Social History Narrative   Makes eye glasses.     Single,   GED   She has 3 children   37- Son [name]- lives locally- he has one son   80- Daughter [name]- 7 children (4 of these children live with the pt- she is a primary care giver)   [place]- lives in [place] 1 daughter   Enjoys reading    ALLERGIES:    No Known Allergies  CURRENT MEDICATIONS:    Current Outpatient Medications  Medication Sig Dispense Refill  . Multiple Vitamin (MULTIVITAMIN) tablet Take 1 tablet by mouth daily.    . nortriptyline (PAMELOR) 50 MG capsule Take 2 capsules (100 mg total) by mouth at bedtime. 60 capsule 2   No current facility-administered medications for this visit.     REVIEW OF SYSTEMS:   [X]  denotes positive finding, [ ]  denotes negative finding Cardiac  Comments:  Chest pain or chest pressure:    Shortness of breath upon exertion:    Short of breath when lying flat:    Irregular heart rhythm:         Vascular    Pain in calf, thigh, or hip brought on by ambulation:    Pain in feet at night that wakes you up from your sleep:     Blood clot in your veins:    Leg swelling:         Pulmonary    Oxygen at home:    Productive cough:     Wheezing:         Neurologic    Sudden weakness in arms or legs:     Sudden numbness in arms or legs:     Sudden onset of difficulty speaking or slurred speech:    Temporary loss of vision in one eye:     Problems with dizziness:         Gastrointestinal    Blood in stool:      Vomited blood:         Genitourinary    Burning when urinating:     Blood in urine:        Psychiatric    Major depression:         Hematologic    Bleeding problems:    Problems with blood clotting too easily:        Skin    Rashes or ulcers:        Constitutional    Fever or chills:     PHYSICAL EXAM:   Vitals:   10/28/18 0956 10/28/18 0958  BP: 139/88 132/84  Pulse: 83 83  Resp: 14   Temp: 97.6 F (36.4 C)   TempSrc: Oral   SpO2: 100%   Weight: 179 lb 0.2 oz (81.2 kg)   Height: 5\' 5"  (1.651 m)     GENERAL: The patient is a well-nourished female, in no acute distress. The vital signs are documented above. CARDIAC: There is a regular rate and rhythm.  VASCULAR: Palpable dorsalis pedis pulses.  No carotid bruits. PULMONARY: Nonlabored respirations ABDOMEN: Soft and non-tender\.  No pulsatile mass MUSCULOSKELETAL: There are no major deformities or cyanosis. NEUROLOGIC: No focal weakness or paresthesias are detected. SKIN: There are no ulcers or rashes noted. PSYCHIATRIC: The patient has a normal affect.  STUDIES:   I have ordered and reviewed her studies with the following findings:  Right Carotid: Velocities in the right ICA are  consistent with a 1-39% stenosis.  Left Carotid: Velocities in the left ICA are consistent with a 1-39% stenosis.  Vertebrals:  Bilateral vertebral arteries demonstrate antegrade flow. Subclavians: Normal flow  hemodynamics were seen in bilateral subclavian              arteries.  CTA from 2017: Atherosclerotic disease affecting both carotid siphons. No stenosis greater than 20%. 3.5 mm wide mouth aneurysm projecting posteriorly from the supraclinoid ICA on the right. No other aneurysm. Likely incidental finding. No posterior circulation pathology evident.  ASSESSMENT and PLAN   Carotid aneurysm: I discussed with the patient that this is a supraclinoid aneurysm and therefore I will make referral to neurosurgery for further management.  I told her they would likely need new imaging.  The patient does not have any evidence of carotid stenosis.  She would benefit from smoking cessation.   Annamarie Major, MD [facility] [phone] Pager [phone]

## 2018-11-04 ENCOUNTER — Encounter: Payer: Self-pay | Admitting: Family

## 2018-11-04 ENCOUNTER — Ambulatory Visit: Payer: 59 | Admitting: Family

## 2018-11-04 VITALS — BP 144/84 | HR 89 | Temp 98.3°F | Resp 16 | Ht 65.0 in | Wt 183.0 lb

## 2018-11-04 DIAGNOSIS — M545 Low back pain, unspecified: Secondary | ICD-10-CM

## 2018-11-04 DIAGNOSIS — R030 Elevated blood-pressure reading, without diagnosis of hypertension: Secondary | ICD-10-CM | POA: Diagnosis not present

## 2018-11-04 DIAGNOSIS — G44221 Chronic tension-type headache, intractable: Secondary | ICD-10-CM

## 2018-11-04 DIAGNOSIS — G4733 Obstructive sleep apnea (adult) (pediatric): Secondary | ICD-10-CM

## 2018-11-04 DIAGNOSIS — M79605 Pain in left leg: Secondary | ICD-10-CM

## 2018-11-04 DIAGNOSIS — R3911 Hesitancy of micturition: Secondary | ICD-10-CM

## 2018-11-04 MED ORDER — NORTRIPTYLINE HCL 50 MG PO CAPS: 100.0000 mg | ORAL_CAPSULE | Freq: Every day | ORAL | 5 refills | Status: DC

## 2018-11-04 MED ORDER — MELOXICAM 7.5 MG PO TABS: 7.5000 mg | ORAL_TABLET | Freq: Every day | ORAL | 0 refills | Status: DC

## 2018-11-04 MED FILL — MELOXICAM 7.5 MG TABLET: 7.5 | 14 days supply | Qty: 14 | Fill #0

## 2018-11-04 NOTE — Patient Instructions (Addendum)
Please work on low sodium diet, exercise, weight loss and quitting smoking.  Begin meloxicam once daily for back and leg pain.

## 2018-11-04 NOTE — Progress Notes (Signed)
Subjective:    Patient ID: Jaime Harris, female    DOB: Oct 13, 1955, 63 y.o.   MRN: [id number]  HPI  Patient is a 63 yr old female who presents today for follow up.   Tension headache-last visit we added nortriptyline at bedtime.  She reports that she has had no headaches recently.  Reports tolerating nortriptyline without difficulty.  Notes good mood.  The patient reports that she does have some low back pain as well as some lower extremity edema.  She is now standing a lot at her job.  Notes that she has some burning pain in the bilateral shins.  BP Readings from Last 3 Encounters:  11/04/18 (!) 153/82  10/28/18 132/84  08/02/18 (!) 144/70   She does report some urinary hesitancy and less strong stream recently.  She feels like this started after she had her cystoscopy with urology.  Obstructive sleep apnea-this is mild per home sleep study.  Review of Systems  See HPI  Past Medical History:  Diagnosis Date  . Chronic tension headaches   . Headache   . Hyperlipidemia    borderline  . Sickle cell trait ([place])   . Thyroid disease    enlarged thyroid  . Vertigo   . Vitamin D deficiency      Social History   Socioeconomic History  . Marital status: Single    Spouse name: Not on file  . Number of children: 3  . Years of education: Not on file  . Highest education level: Not on file  Occupational History    Comment: Cashier  Social Needs  . Financial resource strain: Not on file  . Food insecurity:    Worry: Not on file    Inability: Not on file  . Transportation needs:    Medical: Not on file    Non-medical: Not on file  Tobacco Use  . Smoking status: Current Every Day Smoker    Packs/day: 0.50    Types: Cigarettes  . Smokeless tobacco: Never Used  Substance and Sexual Activity  . Alcohol use: Yes    Comment: occasional  . Drug use: No  . Sexual activity: Not on file  Lifestyle  . Physical activity:    Days per week: Not on file    Minutes per  session: Not on file  . Stress: Not on file  Relationships  . Social connections:    Talks on phone: Not on file    Gets together: Not on file    Attends religious service: Not on file    Active member of club or organization: Not on file    Attends meetings of clubs or organizations: Not on file    Relationship status: Not on file  . Intimate partner violence:    Fear of current or ex partner: Not on file    Emotionally abused: Not on file    Physically abused: Not on file    Forced sexual activity: Not on file  Other Topics Concern  . Not on file  Social History Narrative   Makes eye glasses.     Single,   GED   She has 3 children   63- Son [name]- lives locally- he has one son   70- Daughter [name]- 7 children (4 of these children live with the pt- she is a primary care giver)   [place]- lives in [place] 1 daughter   Enjoys reading    Past Surgical History:  Procedure Laterality Date  .  FOOT SURGERY Bilateral    Pt states she had bones removed from the sides of her feet and middle toes so she could walk better.  . TONSILLECTOMY  1975  . TUBAL LIGATION      Family History  Problem Relation Age of Onset  . Heart disease Mother        Investment banker, corporate  . Diabetes Mother   . Heart disease Paternal Grandmother        died of CHF  . Diabetes Paternal Grandmother   . Diabetes Brother   . Cancer Neg Hx   . Kidney disease Neg Hx   . Colon cancer Neg Hx   . Colon polyps Neg Hx   . Rectal cancer Neg Hx   . Stomach cancer Neg Hx     No Known Allergies  Current Outpatient Medications on File Prior to Visit  Medication Sig Dispense Refill  . Multiple Vitamin (MULTIVITAMIN) tablet Take 1 tablet by mouth daily.    . nortriptyline (PAMELOR) 50 MG capsule Take 2 capsules (100 mg total) by mouth at bedtime. 60 capsule 2   No current facility-administered medications on file prior to visit.     BP (!) 153/82 (BP Location: Right Arm, Patient Position:  Sitting, Cuff Size: Normal)   Pulse 89   Temp 98.3 F (36.8 C) (Oral)   Resp 16   Ht 5\' 5"  (1.651 m)   Wt 183 lb (83 kg)   SpO2 96%   BMI 30.45 kg/m        Objective:   Physical Exam Constitutional:      Appearance: She is well-developed.  Neck:     Musculoskeletal: Neck supple.     Thyroid: No thyromegaly.  Cardiovascular:     Rate and Rhythm: Normal rate and regular rhythm.     Heart sounds: Normal heart sounds. No murmur.  Pulmonary:     Effort: Pulmonary effort is normal. No respiratory distress.     Breath sounds: Normal breath sounds. No wheezing.  Skin:    General: Skin is warm and dry.  Neurological:     Mental Status: She is alert and oriented to person, place, and time.  Psychiatric:        Behavior: Behavior normal.        Thought Content: Thought content normal.        Judgment: Judgment normal.           Assessment & Plan:  Elevated blood pressure reading- we discussed low-sodium diet, exercise, and weight loss.  If SBP greater than 140 next visit consider adding low-dose antihypertensive.  Low back pain with radiculopathy- history most consistent with lumbar radiculopathy.  Will give trial of short course of meloxicam.  Tension headache-stable on nortriptyline.  Continue same.  Urinary hesitancy-will obtain urine culture for further evaluation.  Obstructive sleep apnea-this is mild.  Recommended weight loss.

## 2018-11-05 LAB — URINE CULTURE
MICRO NUMBER: 232966
RESULT: NO GROWTH
SPECIMEN QUALITY: ADEQUATE

## 2018-12-03 ENCOUNTER — Encounter: Payer: 59 | Admitting: Family

## 2018-12-16 ENCOUNTER — Telehealth: Payer: Self-pay | Admitting: *Deleted

## 2018-12-16 ENCOUNTER — Encounter: Payer: Self-pay | Admitting: *Deleted

## 2018-12-16 NOTE — Telephone Encounter (Signed)
Called patient and advised her Due to current COVID 19 pandemic, our office is reducing in person visits in order to minimize the risk to our patients and healthcare providers. We recommend to convert your appointment to a video visit. We'll take all precautions to reduce any security or privacy concerns. This will be treated like an office visit, and we will file with your insurance.  She consented to video visit. Pt's email is [email or web address].  Pt understands that the [employer] webex software must be downloaded and operational on the device pt plans to use for the visit.  Chart updated.

## 2018-12-17 NOTE — Telephone Encounter (Signed)
Pt called in and stated she is unable to download web ex due to her phone she wants to know what other option she has

## 2018-12-17 NOTE — Telephone Encounter (Signed)
Called patient who stated her phone has not been "acting right" lately. She is unable to download Webex app.  I asked if she has computer; she does. I advised she open the [employer] on computer, click link and download Webex on her computer. She will call back today to let me know if she is successful. Her video visit is tomorrow at 8:30 am.

## 2018-12-18 ENCOUNTER — Ambulatory Visit (INDEPENDENT_AMBULATORY_CARE_PROVIDER_SITE_OTHER): Payer: 59 | Admitting: Diagnostic Neuroimaging

## 2018-12-18 ENCOUNTER — Encounter: Payer: Self-pay | Admitting: Diagnostic Neuroimaging

## 2018-12-18 ENCOUNTER — Ambulatory Visit: Payer: 59 | Admitting: Diagnostic Neuroimaging

## 2018-12-18 ENCOUNTER — Other Ambulatory Visit: Payer: Self-pay

## 2018-12-18 DIAGNOSIS — G44209 Tension-type headache, unspecified, not intractable: Secondary | ICD-10-CM

## 2018-12-18 DIAGNOSIS — I671 Cerebral aneurysm, nonruptured: Secondary | ICD-10-CM | POA: Diagnosis not present

## 2018-12-18 NOTE — Telephone Encounter (Signed)
Patient unable to join meeting. Dr Leta Baptist notified via skype. She will download app on PC; appt rescheduled for 3:30 today. She is to call with any problems.

## 2018-12-18 NOTE — Telephone Encounter (Signed)
Called patient to assist with Webex. She stated she was able to download on her phone but app states her e mail is not recognized. Walked her through steps and advised she join meeting through the [employer], not the app. She will try at 8:25 am. If unable she will uninstall, reinstall the app.

## 2018-12-18 NOTE — Progress Notes (Signed)
[facility]  PATIENT: Jaime Harris DOB: [birth year]  REFERRING CLINICIAN: Elvera Harris / Alphonsa Overall HISTORY FROM: patient REASON FOR VISIT: new consult (video)   HISTORICAL  CHIEF COMPLAINT:  Chief Complaint  Patient presents with  . Aneurysm  . Headache    HISTORY OF PRESENT ILLNESS:   63 year old female here for evaluation of asymptomatic unruptured cerebral aneurysm and chronic tension headaches.  Patient has history of dizziness around 2014.  Also developed headaches in 2016.  She describes squeezing bitemporal headaches.  No nausea or vomiting.  No sensitivity light or sound.  Patient was diagnosed with chronic tension headaches.  She is been treated with nortriptyline with good results.  In 2017 patient had CTA of the head to rule out other causes of headache.  Patient was found to have incidental right supraclinoid cerebral aneurysm measuring 3 to 4 mm.  Follow-up MRA was recommended but patient was lost to follow-up.  Since that time patient is doing well.  No major problems with headache.  No new neurologic symptoms.  No numbness or tingling.  No weakness.  No vision changes.  Patient is monitoring blood pressure.  Patient continues to smoke but is trying to quit.    REVIEW OF SYSTEMS: Full 14 system review of systems performed and negative with exception of: As per HPI.  ALLERGIES: No Known Allergies  HOME MEDICATIONS: Outpatient Medications Prior to Visit  Medication Sig Dispense Refill  . meloxicam (MOBIC) 7.5 MG tablet Take 1 tablet (7.5 mg total) by mouth daily. 14 tablet 0  . Multiple Vitamin (MULTIVITAMIN) tablet Take 1 tablet by mouth daily.    . nortriptyline (PAMELOR) 50 MG capsule Take 2 capsules (100 mg total) by mouth at bedtime. 60 capsule 5   No facility-administered medications prior to visit.     PAST MEDICAL HISTORY: Past Medical History:  Diagnosis Date  . Aneurysm ([place]) 2020   supraclinoid R int carotid artery  .  Chronic tension headaches   . Headache   . Hyperlipidemia    borderline  . Sickle cell trait ([place])   . Thyroid disease    enlarged thyroid  . Vertigo   . Vitamin D deficiency     PAST SURGICAL HISTORY: Past Surgical History:  Procedure Laterality Date  . FOOT SURGERY Bilateral    Pt states she had bones removed from the sides of her feet and middle toes so she could walk better.  . TONSILLECTOMY  1975  . TUBAL LIGATION      FAMILY HISTORY: Family History  Problem Relation Age of Onset  . Heart disease Mother        Investment banker, corporate  . Diabetes Mother   . Heart disease Paternal Grandmother        died of CHF  . Diabetes Paternal Grandmother   . Diabetes Brother   . Cancer Neg Hx   . Kidney disease Neg Hx   . Colon cancer Neg Hx   . Colon polyps Neg Hx   . Rectal cancer Neg Hx   . Stomach cancer Neg Hx     SOCIAL HISTORY: Social History   Socioeconomic History  . Marital status: Single    Spouse name: Not on file  . Number of children: 3  . Years of education: Not on file  . Highest education level: High school graduate  Occupational History    Comment: Cashier  Social Needs  . Financial resource strain: Not on file  . Food insecurity:  Worry: Not on file    Inability: Not on file  . Transportation needs:    Medical: Not on file    Non-medical: Not on file  Tobacco Use  . Smoking status: Current Every Day Smoker    Packs/day: 0.50    Types: Cigarettes  . Smokeless tobacco: Never Used  Substance and Sexual Activity  . Alcohol use: Yes    Comment: occasional  . Drug use: No  . Sexual activity: Not on file  Lifestyle  . Physical activity:    Days per week: Not on file    Minutes per session: Not on file  . Stress: Not on file  Relationships  . Social connections:    Talks on phone: Not on file    Gets together: Not on file    Attends religious service: Not on file    Active member of club or organization: Not on file    Attends  meetings of clubs or organizations: Not on file    Relationship status: Not on file  . Intimate partner violence:    Fear of current or ex partner: Not on file    Emotionally abused: Not on file    Physically abused: Not on file    Forced sexual activity: Not on file  Other Topics Concern  . Not on file  Social History Narrative   Makes eye glasses.     Single   GED   She has 3 children   28- Son [name]- lives locally- he has one son   81- Daughter [name]- 7 children (4 of these children live with the pt- she is a primary care giver)   [place]- lives in [place] 1 daughter   Enjoys reading   Caffeine- coffee, 4 cups daily, soda every other day     PHYSICAL EXAM  Video exam  GENERAL EXAM/CONSTITUTIONAL:  Vitals: There were no vitals filed for this visit.  There is no height or weight on file to calculate BMI. Wt Readings from Last 3 Encounters:  11/04/18 183 lb (83 kg)  10/28/18 179 lb 0.2 oz (81.2 kg)  08/02/18 180 lb 9.6 oz (81.9 kg)     Patient is in no distress; well developed, nourished and groomed; neck is supple   NEUROLOGIC: MENTAL STATUS:  No flowsheet data found.  awake, alert, oriented to person, place and time  recent and remote memory intact  normal attention and concentration  language fluent, comprehension intact, naming intact  fund of knowledge appropriate  CRANIAL NERVE:   2nd, 3rd, 4th, 6th - visual fields full to confrontation, extraocular muscles intact, no nystagmus  7th - facial strength symmetric  8th - hearing intact  11th - shoulder shrug symmetric  12th - tongue protrusion midline  MOTOR:   NO DRIFT OR TREMOR IN BUE   COORDINATION:   fine finger movements normal      DIAGNOSTIC DATA (LABS, IMAGING, TESTING) - I reviewed patient records, labs, notes, testing and imaging myself where available.  Lab Results  Component Value Date   WBC 7.4 10/10/2017   HGB 13.4 10/10/2017   HCT 38.8 10/10/2017    MCV 84.2 10/10/2017   PLT 184 10/10/2017      Component Value Date/Time   NA 139 10/10/2017 1600   K 3.9 10/10/2017 1600   CL 104 10/10/2017 1600   CO2 26 10/10/2017 1600   GLUCOSE 82 10/10/2017 1600   BUN 11 10/10/2017 1600   CREATININE 0.85 10/10/2017 1600  CREATININE 0.93 10/01/2015 0913   CALCIUM 9.2 10/10/2017 1600   PROT 7.1 04/19/2016 0958   ALBUMIN 4.0 04/19/2016 0958   AST 14 04/19/2016 0958   ALT 13 04/19/2016 0958   ALKPHOS 128 (H) 04/19/2016 0958   BILITOT 0.4 04/19/2016 0958   GFRNONAA >60 10/10/2017 1600   GFRAA >60 10/10/2017 1600   Lab Results  Component Value Date   CHOL 222 (H) 04/19/2016   HDL 48.00 04/19/2016   LDLCALC 144 (H) 04/19/2016   TRIG 148.0 04/19/2016   CHOLHDL 5 04/19/2016   No results found for: HGBA1C No results found for: VITAMINB12 Lab Results  Component Value Date   TSH 0.99 04/19/2016    04/07/16 CTA head  - Atherosclerotic disease affecting both carotid siphons. No stenosis greater than 20%.  - 3.5 mm wide mouth aneurysm projecting posteriorly from the supraclinoid ICA on the right. No other aneurysm. Likely incidental finding.  - No posterior circulation pathology evident.    ASSESSMENT AND PLAN  63 y.o. year old female here with:   Dx:  1. Tension headache   2. Nonruptured cerebral aneurysm    Virtual Visit via Video Note  I connected with Elenora Gamma on 12/18/18 at  3:30 PM EDT by a video enabled telemedicine application and verified that I am speaking with the correct person using two identifiers.   I discussed the limitations of evaluation and management by telemedicine and the availability of in person appointments. The patient expressed understanding and agreed to proceed.   I discussed the assessment and treatment plan with the patient. The patient was provided an opportunity to ask questions and all were answered. The patient agreed with the plan and demonstrated an understanding of the instructions.    The patient was advised to call back or seek an in-person evaluation if the symptoms worsen or if the condition fails to improve as anticipated.  I provided 25 minutes of non-face-to-face time during this encounter.   PLAN:  CHRONIC TENSION HEADACHES - stable on nortriptyline  UNRUPTURED, ASYMPTOMATIC CEREBRAL ANEURYSM (right supraclinoid) - CHECK MRA head (without) to follow up aneurysm size - monitor BP; maintain normotension - stop smoking cigarettes  Orders Placed This Encounter  Procedures  . MR MRA HEAD WO CONTRAST   Return in about 6 months (around 06/19/2019).    Penni Bombard, MD 01/18/2923, 4:62 PM Certified in Neurology, Neurophysiology and Neuroimaging  [facility] [address] [place], [place] [address] [phone]

## 2018-12-19 ENCOUNTER — Telehealth: Payer: Self-pay | Admitting: Diagnostic Neuroimaging

## 2018-12-19 NOTE — Telephone Encounter (Signed)
[place]: [id number] (exp. 12/19/18 to 02/02/19) patient is scheduled at GI for 12/24/18.

## 2018-12-23 NOTE — Telephone Encounter (Signed)
Patient is scheduled at GI for 12/24/18.

## 2018-12-24 ENCOUNTER — Ambulatory Visit
Admission: RE | Admit: 2018-12-24 | Discharge: 2018-12-24 | Disposition: A | Discharge status: Home / Self Care | Payer: 59 | Source: Ambulatory Visit | Attending: Diagnostic Neuroimaging | Admitting: Diagnostic Neuroimaging

## 2018-12-24 ENCOUNTER — Other Ambulatory Visit: Payer: Self-pay

## 2018-12-24 DIAGNOSIS — I671 Cerebral aneurysm, nonruptured: Secondary | ICD-10-CM

## 2018-12-31 ENCOUNTER — Telehealth: Payer: Self-pay | Admitting: *Deleted

## 2018-12-31 NOTE — Telephone Encounter (Signed)
LVM requesting call back for results. 

## 2018-12-31 NOTE — Telephone Encounter (Signed)
Patient returned call. Informed her that her MRA head showed the stable small aneurysm (known from before). Dr Leta Baptist recommends repeat imaging in 1-2 years. He advises to continue current plan. Patient verbalized understanding, appreciation.

## 2019-01-13 ENCOUNTER — Encounter: Payer: 59 | Admitting: Family

## 2019-03-05 ENCOUNTER — Other Ambulatory Visit: Payer: Self-pay | Admitting: Family

## 2019-03-05 DIAGNOSIS — Z1231 Encounter for screening mammogram for malignant neoplasm of breast: Secondary | ICD-10-CM

## 2019-04-01 ENCOUNTER — Encounter: Payer: Self-pay | Admitting: Family

## 2019-04-01 ENCOUNTER — Other Ambulatory Visit: Payer: Self-pay

## 2019-04-01 ENCOUNTER — Ambulatory Visit (INDEPENDENT_AMBULATORY_CARE_PROVIDER_SITE_OTHER): Payer: 59 | Admitting: Family

## 2019-04-01 VITALS — BP 139/75 | HR 73 | Temp 97.8°F | Resp 16 | Ht 65.0 in | Wt 187.6 lb

## 2019-04-01 DIAGNOSIS — Z0000 Encounter for general adult medical examination without abnormal findings: Secondary | ICD-10-CM | POA: Diagnosis not present

## 2019-04-01 DIAGNOSIS — E2839 Other primary ovarian failure: Secondary | ICD-10-CM

## 2019-04-01 LAB — HEPATIC FUNCTION PANEL
ALT: 11 U/L (ref 0–35)
AST: 13 U/L (ref 0–37)
Albumin: 4.1 g/dL (ref 3.5–5.2)
Alkaline Phosphatase: 125 U/L — ABNORMAL HIGH (ref 39–117)
Bilirubin, Direct: 0 mg/dL (ref 0.0–0.3)
Total Bilirubin: 0.3 mg/dL (ref 0.2–1.2)
Total Protein: 6.5 g/dL (ref 6.0–8.3)

## 2019-04-01 LAB — CBC WITH DIFFERENTIAL/PLATELET
Basophils Absolute: 0 10*3/uL (ref 0.0–0.1)
Basophils Relative: 0.3 % (ref 0.0–3.0)
Eosinophils Absolute: 0.2 10*3/uL (ref 0.0–0.7)
Eosinophils Relative: 3.4 % (ref 0.0–5.0)
HCT: 39 % (ref 36.0–46.0)
Hemoglobin: 13.1 g/dL (ref 12.0–15.0)
Lymphocytes Relative: 51.8 % — ABNORMAL HIGH (ref 12.0–46.0)
Lymphs Abs: 2.8 10*3/uL (ref 0.7–4.0)
MCHC: 33.5 g/dL (ref 30.0–36.0)
MCV: 87.6 fl (ref 78.0–100.0)
Monocytes Absolute: 0.4 10*3/uL (ref 0.1–1.0)
Monocytes Relative: 6.9 % (ref 3.0–12.0)
Neutro Abs: 2.1 10*3/uL (ref 1.4–7.7)
Neutrophils Relative %: 37.6 % — ABNORMAL LOW (ref 43.0–77.0)
Platelets: 193 10*3/uL (ref 150.0–400.0)
RBC: 4.45 Mil/uL (ref 3.87–5.11)
RDW: 14.6 % (ref 11.5–15.5)
WBC: 5.5 10*3/uL (ref 4.0–10.5)

## 2019-04-01 LAB — BASIC METABOLIC PANEL
BUN: 18 mg/dL (ref 6–23)
CO2: 26 mEq/L (ref 19–32)
Calcium: 9 mg/dL (ref 8.4–10.5)
Chloride: 107 mEq/L (ref 96–112)
Creatinine, Ser: 0.92 mg/dL (ref 0.40–1.20)
GFR: 74.59 mL/min (ref >60.00)
Glucose, Bld: 79 mg/dL (ref 70–99)
Potassium: 4.1 mEq/L (ref 3.5–5.1)
Sodium: 141 mEq/L (ref 135–145)

## 2019-04-01 LAB — TSH: TSH: 0.91 u[IU]/mL (ref 0.35–4.50)

## 2019-04-01 LAB — LIPID PANEL
Cholesterol: 209 mg/dL — ABNORMAL HIGH (ref 0–200)
HDL: 45.3 mg/dL (ref >39.00)
LDL Cholesterol: 134 mg/dL — ABNORMAL HIGH (ref 0–99)
NonHDL: 163.51
Total CHOL/HDL Ratio: 5
Triglycerides: 148 mg/dL (ref 0.0–149.0)
VLDL: 29.6 mg/dL (ref 0.0–40.0)

## 2019-04-01 MED ORDER — CHANTIX STARTING MONTH PAK 0.5 MG X 11 & 1 MG X 42 PO TABS
ORAL_TABLET | ORAL | 0 refills | Status: DC
Start: 2019-04-01 — End: 2019-04-11

## 2019-04-01 NOTE — Patient Instructions (Addendum)
Please complete lab work prior to leaving. Work on quitting smoking. Let me know how you are doing with quitting in about 3 weeks and we can send refills of the chantix. Complete lab work prior to leaving.

## 2019-04-01 NOTE — Progress Notes (Signed)
Subjective:    Patient ID: Jaime Harris, female    DOB: 1956-07-11, 63 y.o.   MRN: [id number]  HPI  Patient presents today for complete physical.  Immunizations:  tdap 2016, declines shingrix Diet: trying to eat healthy Exercise:  no Colonoscopy: 8/19 Dexa: 2016, due Pap Smear: 01/15/18 Mammogram: scheduled  Vision: 2/20 Dental:  Due, plans to schedule Wt Readings from Last 3 Encounters:  04/01/19 187 lb 9.6 oz (85.1 kg)  11/04/18 183 lb (83 kg)  10/28/18 179 lb 0.2 oz (81.2 kg)   Tobacco abuse-she reports that she continues to smoke however she is motivated to quit.      Review of Systems  Constitutional: Negative for unexpected weight change.  HENT: Negative for hearing loss and rhinorrhea.        Notes intermittent pain beneath the right ear.    Eyes: Negative for visual disturbance.  Respiratory: Negative for cough and shortness of breath.   Cardiovascular: Negative for chest pain.  Gastrointestinal: Negative for blood in stool, constipation and diarrhea.  Genitourinary: Negative for dysuria, frequency and hematuria.  Musculoskeletal: Negative for arthralgias and myalgias.  Skin: Negative for rash.  Neurological:       Occasional headaches  Hematological: Negative for adenopathy.  Psychiatric/Behavioral:       Denies depression/anxiety   Past Medical History:  Diagnosis Date  . Aneurysm ([place]) 2020   supraclinoid R int carotid artery  . Chronic tension headaches   . Headache   . Hyperlipidemia    borderline  . Sickle cell trait ([place])   . Thyroid disease    enlarged thyroid  . Vertigo   . Vitamin D deficiency      Social History   Socioeconomic History  . Marital status: Single    Spouse name: Not on file  . Number of children: 3  . Years of education: Not on file  . Highest education level: High school graduate  Occupational History    Comment: Cashier  Social Needs  . Financial resource strain: Not on file  . Food insecurity    Worry: Not  on file    Inability: Not on file  . Transportation needs    Medical: Not on file    Non-medical: Not on file  Tobacco Use  . Smoking status: Current Every Day Smoker    Packs/day: 0.50    Types: Cigarettes  . Smokeless tobacco: Never Used  Substance and Sexual Activity  . Alcohol use: Yes    Comment: occasional  . Drug use: No  . Sexual activity: Not on file  Lifestyle  . Physical activity    Days per week: Not on file    Minutes per session: Not on file  . Stress: Not on file  Relationships  . Social Herbalist on phone: Not on file    Gets together: Not on file    Attends religious service: Not on file    Active member of club or organization: Not on file    Attends meetings of clubs or organizations: Not on file    Relationship status: Not on file  . Intimate partner violence    Fear of current or ex partner: Not on file    Emotionally abused: Not on file    Physically abused: Not on file    Forced sexual activity: Not on file  Other Topics Concern  . Not on file  Social History Narrative   Makes eye glasses.  Single   GED   She has 3 children   67- Son [name]- lives locally- he has one son   83- Daughter [name]- 7 children (4 of these children live with the pt- she is a primary care giver)   [place]- lives in [place] 1 daughter   Enjoys reading   Caffeine- coffee, 4 cups daily, soda every other day    Past Surgical History:  Procedure Laterality Date  . FOOT SURGERY Bilateral    Pt states she had bones removed from the sides of her feet and middle toes so she could walk better.  . TONSILLECTOMY  1975  . TUBAL LIGATION      Family History  Problem Relation Age of Onset  . Heart disease Mother        Investment banker, corporate  . Diabetes Mother   . Heart disease Paternal Grandmother        died of CHF  . Diabetes Paternal Grandmother   . Diabetes Brother   . Cancer Neg Hx   . Kidney disease Neg Hx   . Colon cancer Neg Hx   .  Colon polyps Neg Hx   . Rectal cancer Neg Hx   . Stomach cancer Neg Hx     No Known Allergies  Current Outpatient Medications on File Prior to Visit  Medication Sig Dispense Refill  . Multiple Vitamin (MULTIVITAMIN) tablet Take 1 tablet by mouth daily.     No current facility-administered medications on file prior to visit.     BP 139/75 (BP Location: Right Arm, Patient Position: Sitting, Cuff Size: Small)   Pulse 73   Temp 97.8 F (36.6 C) (Oral)   Resp 16   Ht 5\' 5"  (1.651 m)   Wt 187 lb 9.6 oz (85.1 kg)   SpO2 100%   BMI 31.22 kg/m       Objective:   Physical Exam  Physical Exam  Constitutional: She is oriented to person, place, and time. She appears well-developed and well-nourished. No distress.  HENT:  Head: Normocephalic and atraumatic.  Right Ear: Tympanic membrane and ear canal normal.  Left Ear: Tympanic membrane and ear canal normal.  Mouth/Throat: Oropharynx is clear and moist.  Eyes: Pupils are equal, round, and reactive to light. No scleral icterus.  Neck: Normal range of motion. No thyromegaly present.  Cardiovascular: Normal rate and regular rhythm.   No murmur heard. Pulmonary/Chest: Effort normal and breath sounds normal. No respiratory distress. He has no wheezes. She has no rales. She exhibits no tenderness.  Abdominal: Soft. Bowel sounds are normal. She exhibits no distension and no mass. There is no tenderness. There is no rebound and no guarding.  Musculoskeletal: She exhibits no edema.  Lymphadenopathy:    She has no cervical adenopathy.  Neurological: She is alert and oriented to person, place, and time. She has normal patellar reflexes. She exhibits normal muscle tone. Coordination normal.  Skin: Skin is warm and dry.  Psychiatric: She has a normal mood and affect. Her behavior is normal. Judgment and thought content normal.  Breasts: Examined lying Right: Without masses, retractions, discharge or axillary adenopathy.  Left: Without masses,  retractions, discharge or axillary adenopathy.  Pelvic: deferred          Assessment & Plan:   Preventive care-we discussed healthy diet, exercise and weight loss.  We also discussed importance of tobacco cessation.  She would like to retry Chantix.  Will obtain routine lab work.  Tetanus is up-to-date.  Patient  declines Shingrix.  Recommended that she obtain a flu shot in the fall.  Will order follow-up DEXA scan.  Colonoscopy is up-to-date.  Mammogram is scheduled.  Pap smear is up-to-date.      Assessment & Plan:

## 2019-04-11 ENCOUNTER — Telehealth: Payer: Self-pay | Admitting: Family

## 2019-04-11 MED ORDER — CHANTIX STARTING MONTH PAK 0.5 MG X 11 & 1 MG X 42 PO TABS: ORAL_TABLET | ORAL | 0 refills | Status: DC

## 2019-04-11 NOTE — Telephone Encounter (Signed)
Pt called and stated that All Administrations of varenicline (CHANTIX STARTING MONTH PAK) 0.5 MG X 11 & 1 MG X 42 tablet was not received by pharmacy. I called the pharmacy and confirmed they do not have RX. Please resend.

## 2019-04-11 NOTE — Telephone Encounter (Signed)
Rx resent.

## 2019-04-18 ENCOUNTER — Ambulatory Visit
Admission: RE | Admit: 2019-04-18 | Discharge: 2019-04-18 | Disposition: A | Discharge status: Home / Self Care | Payer: 59 | Source: Ambulatory Visit | Attending: Family | Admitting: Family

## 2019-04-18 ENCOUNTER — Other Ambulatory Visit: Payer: Self-pay

## 2019-04-18 DIAGNOSIS — Z1231 Encounter for screening mammogram for malignant neoplasm of breast: Secondary | ICD-10-CM

## 2019-04-21 ENCOUNTER — Other Ambulatory Visit: Payer: Self-pay | Admitting: Family

## 2019-04-21 DIAGNOSIS — R928 Other abnormal and inconclusive findings on diagnostic imaging of breast: Secondary | ICD-10-CM

## 2019-04-24 ENCOUNTER — Other Ambulatory Visit: Payer: Self-pay

## 2019-04-24 ENCOUNTER — Other Ambulatory Visit: Payer: Self-pay | Admitting: Family

## 2019-04-24 ENCOUNTER — Ambulatory Visit
Admission: RE | Admit: 2019-04-24 | Discharge: 2019-04-24 | Disposition: A | Discharge status: Home / Self Care | Payer: 59 | Source: Ambulatory Visit | Attending: Family | Admitting: Family

## 2019-04-24 DIAGNOSIS — R928 Other abnormal and inconclusive findings on diagnostic imaging of breast: Secondary | ICD-10-CM

## 2019-04-24 DIAGNOSIS — R921 Mammographic calcification found on diagnostic imaging of breast: Secondary | ICD-10-CM

## 2019-04-28 ENCOUNTER — Ambulatory Visit
Admission: RE | Admit: 2019-04-28 | Discharge: 2019-04-28 | Disposition: A | Discharge status: Home / Self Care | Payer: 59 | Source: Ambulatory Visit | Attending: Family | Admitting: Family

## 2019-04-28 ENCOUNTER — Other Ambulatory Visit: Payer: Self-pay

## 2019-04-28 DIAGNOSIS — R921 Mammographic calcification found on diagnostic imaging of breast: Secondary | ICD-10-CM

## 2019-05-02 ENCOUNTER — Ambulatory Visit (HOSPITAL_BASED_OUTPATIENT_CLINIC_OR_DEPARTMENT_OTHER)
Admission: RE | Admit: 2019-05-02 | Discharge: 2019-05-02 | Disposition: A | Discharge status: Home / Self Care | Payer: 59 | Source: Ambulatory Visit | Attending: Family | Admitting: Family

## 2019-05-02 ENCOUNTER — Other Ambulatory Visit: Payer: Self-pay

## 2019-05-02 DIAGNOSIS — E2839 Other primary ovarian failure: Secondary | ICD-10-CM | POA: Diagnosis not present

## 2019-05-08 ENCOUNTER — Other Ambulatory Visit: Payer: Self-pay | Admitting: *Deleted

## 2019-05-08 MED ORDER — VARENICLINE TARTRATE 1 MG PO TABS: 1.0000 mg | ORAL_TABLET | Freq: Two times a day (BID) | ORAL | 0 refills | Status: DC

## 2019-05-12 ENCOUNTER — Other Ambulatory Visit: Payer: Self-pay | Admitting: Family

## 2019-07-24 ENCOUNTER — Ambulatory Visit (INDEPENDENT_AMBULATORY_CARE_PROVIDER_SITE_OTHER): Payer: 59 | Admitting: Family Medicine

## 2019-07-24 ENCOUNTER — Encounter: Payer: Self-pay | Admitting: Family Medicine

## 2019-07-24 ENCOUNTER — Ambulatory Visit (HOSPITAL_BASED_OUTPATIENT_CLINIC_OR_DEPARTMENT_OTHER)
Admission: RE | Admit: 2019-07-24 | Discharge: 2019-07-24 | Disposition: A | Discharge status: Home / Self Care | Payer: 59 | Source: Ambulatory Visit | Attending: Family Medicine | Admitting: Family Medicine

## 2019-07-24 ENCOUNTER — Other Ambulatory Visit: Payer: Self-pay

## 2019-07-24 ENCOUNTER — Encounter (HOSPITAL_BASED_OUTPATIENT_CLINIC_OR_DEPARTMENT_OTHER): Payer: Self-pay

## 2019-07-24 VITALS — BP 128/80 | HR 97 | Temp 97.3°F | Resp 16 | Ht 65.0 in | Wt 182.0 lb

## 2019-07-24 DIAGNOSIS — M5441 Lumbago with sciatica, right side: Secondary | ICD-10-CM | POA: Insufficient documentation

## 2019-07-24 DIAGNOSIS — M5442 Lumbago with sciatica, left side: Secondary | ICD-10-CM | POA: Insufficient documentation

## 2019-07-24 MED ORDER — HYDROCODONE-ACETAMINOPHEN 5-325 MG PO TABS: 1.0000 | ORAL_TABLET | Freq: Three times a day (TID) | ORAL | 0 refills | Status: AC | PRN

## 2019-07-24 MED ORDER — HYDROCODONE-ACETAMINOPHEN 5-325 MG PO TABS: 1.0000 | ORAL_TABLET | Freq: Three times a day (TID) | ORAL | 0 refills | Status: DC | PRN

## 2019-07-24 MED ORDER — PREDNISONE 20 MG PO TABS: ORAL_TABLET | ORAL | 0 refills | Status: DC

## 2019-07-24 MED FILL — predniSONE 20 MG TABS: 20 | 6 days supply | Qty: 9 | Fill #0

## 2019-07-24 MED FILL — HYDROCODON-APAP 5-325: 5-325 | 3 days supply | Qty: 15 | Fill #0

## 2019-07-24 NOTE — Progress Notes (Addendum)
[facility] [place], [place], [place] [address] [phone]  Date:  07/24/2019   Name:  Jaime Harris   DOB:  02-24-1956   MRN:  [id number]  PCP:  Debbrah Alar, NP    Chief Complaint: Back Pain (started last night, worse when walking, no injury)   History of Present Illness:  Jaime Harris is a 63 y.o. very pleasant female patient who presents with the following:  Pt of [facility] with history of OSA, IBS, sickle cell trait, stable brain aneurysm Last MRI brain 12/2018: MRA head (without) demonstrating: - Stable, small right supraclinoid aneurysm (79mm). No change from CTA on 04/07/16  - No other stenosis or occlusion.  Here today with concern of back pain that started last night The pain is in her middline "lower back" at the thoracolumbar junction Does not radiate into her legs Pain started yesterday while she was at work - when she drove home and got out of the car she had more pain She had a hard time sleeping last night, hard to get out of bed this am No bowel or bladder control concerns.   Never had this in the past  No urinary sx, no blood in her urine No leg numbness or weakness  No injury or change in activity recently Never had this before No abd pain No headache or other concern  Patient Active Problem List   Diagnosis Date Noted  . Obstructive sleep apnea 11/04/2018  . Headache 04/02/2015  . Chronic tension-type headache, intractable 01/26/2015  . Orthostatic hypotension 01/26/2015  . Vitamin D deficiency 12/30/2014  . Migraine 12/25/2014  . Dizziness 12/25/2014  . Tobacco abuse 12/25/2014  . Carpal tunnel syndrome 12/25/2014  . Preventative health care 12/25/2014  . Thyromegaly 11/16/2014  . BPV (benign positional vertigo) 11/16/2014  . Tension headache 11/16/2014  . IBS (irritable bowel syndrome) 11/16/2014    Past Medical History:  Diagnosis Date  . Aneurysm  ([place]) 2020   supraclinoid R int carotid artery  . Chronic tension headaches   . Headache   . Hyperlipidemia    borderline  . Sickle cell trait ([place])   . Thyroid disease    enlarged thyroid  . Vertigo   . Vitamin D deficiency     Past Surgical History:  Procedure Laterality Date  . FOOT SURGERY Bilateral    Pt states she had bones removed from the sides of her feet and middle toes so she could walk better.  . TONSILLECTOMY  1975  . TUBAL LIGATION      Social History   Tobacco Use  . Smoking status: Current Every Day Smoker    Packs/day: 0.50    Types: Cigarettes  . Smokeless tobacco: Never Used  Substance Use Topics  . Alcohol use: Yes    Comment: occasional  . Drug use: No    Family History  Problem Relation Age of Onset  . Heart disease Mother        Investment banker, corporate  . Diabetes Mother   . Heart disease Paternal Grandmother        died of CHF  . Diabetes Paternal Grandmother   . Diabetes Brother   . Cancer Neg Hx   . Kidney disease Neg Hx   . Colon cancer Neg Hx   . Colon polyps Neg Hx   . Rectal cancer Neg Hx   . Stomach cancer Neg Hx     No Known  Allergies  Medication list has been reviewed and updated.  Current Outpatient Medications on File Prior to Visit  Medication Sig Dispense Refill  . Multiple Vitamin (MULTIVITAMIN) tablet Take 1 tablet by mouth daily.    . varenicline (CHANTIX) 1 MG tablet Take 1 tablet (1 mg total) by mouth 2 (two) times daily. 60 tablet 0   No current facility-administered medications on file prior to visit.     Review of Systems:  As per HPI- otherwise negative.   Physical Examination: Vitals:   07/24/19 1256  BP: 128/80  Pulse: 97  Resp: 16  Temp: (!) 97.3 F (36.3 C)  SpO2: 97%   Vitals:   07/24/19 1256  Weight: 182 lb (82.6 kg)  Height: 5\' 5"  (1.651 m)   Body mass index is 30.29 kg/m. Ideal Body Weight: Weight in (lb) to have BMI = 25: 149.9  GEN: WDWN, NAD, Non-toxic, A & O x 3,  overweight, looks well  HEENT: Atraumatic, Normocephalic. Neck supple. No masses, No LAD.  TM wnl  Ears and Nose: No external deformity. CV: RRR, No M/G/R. No JVD. No thrill. No extra heart sounds. PULM: CTA B, no wheezes, crackles, rhonchi. No retractions. No resp. distress. No accessory muscle use. ABD: S, NT, ND EXTR: No c/c/e NEURO moving stiffly, is uncomfortable with back pain PSYCH: Normally interactive. Conversant. Not depressed or anxious appearing.  Calm demeanor.  Mild tenderness and some spasm of the Paraspinous muscles - thoraolumbar junction No bony TTP No redness or skin lesion  Normal BLE strength, sensation and DTR Mildly positive SLR bilaterally. Right >lt  Assessment and Plan: Midline low back pain with bilateral sciatica, unspecified chronicity - Plan: HYDROcodone-acetaminophen (NORCO/VICODIN) 5-325 MG tablet, predniSONE (DELTASONE) 20 MG tablet, DG Lumbar Spine Complete, DISCONTINUED: predniSONE (DELTASONE) 20 MG tablet, DISCONTINUED: HYDROcodone-acetaminophen (NORCO/VICODIN) 5-325 MG tablet, CANCELED: DG Thoracic Spine 2 View  Here today with new onset back pain Possible bulging disc with positive SLR Will treat with prednisone for 6 days Pain medication as needed Suggested heat, ice, gentle stretching  Asked her to alert me if not feeling better in the next few days- Sooner if worse.   Meds ordered this encounter  Medications  . DISCONTD: predniSONE (DELTASONE) 20 MG tablet    Sig: Take 2 pills daily for 3 days, then 1 pill daily for 3 days    Dispense:  9 tablet    Refill:  0  . DISCONTD: HYDROcodone-acetaminophen (NORCO/VICODIN) 5-325 MG tablet    Sig: Take 1-2 tablets by mouth every 8 (eight) hours as needed for up to 5 days.    Dispense:  15 tablet    Refill:  0  . HYDROcodone-acetaminophen (NORCO/VICODIN) 5-325 MG tablet    Sig: Take 1-2 tablets by mouth every 8 (eight) hours as needed for up to 5 days.    Dispense:  15 tablet    Refill:  0  .  predniSONE (DELTASONE) 20 MG tablet    Sig: Take 2 pills daily for 3 days, then 1 pill daily for 3 days    Dispense:  9 tablet    Refill:  0   Received her films, message to pt  Dg Lumbar Spine Complete  Result Date: 07/24/2019 CLINICAL DATA:  Low back pain without trauma. EXAM: LUMBAR SPINE - COMPLETE 4+ VIEW COMPARISON:  05/03/2016 CT FINDINGS: Six non rib-bearing vertebral bodies. Sacroiliac joints are symmetric. Aortic atherosclerosis. Maintenance of vertebral body height and alignment. Mild loss of intervertebral disc height at multiple lower lumbar  levels. Facet arthropathy involves the lower lumbar spine and lumbosacral junction. IMPRESSION: Similar lumbosacral spondylosis, without acute superimposed process. Aortic Atherosclerosis (ICD10-I70.0). Electronically Signed   By: Abigail Miyamoto M.D.   On: 07/24/2019 15:56     Signed Lamar Blinks, MD

## 2019-07-24 NOTE — Patient Instructions (Addendum)
Good to see you today, but I am sorry you are hurting!   Please stop by the x-ray dept on the ground floor to have an x-ray of your back Then you can get your medications and go home Prednisone for bulging disc- avoid taking ibuprofen/ aleve/ BC while on this med Pain medication as needed for pain- remember this will make you drowsy, do not drive while taking this medication  Please let me know if you are not feeling better in the next few days- Sooner if worse.

## 2019-07-30 ENCOUNTER — Encounter: Payer: Self-pay | Admitting: Family

## 2019-07-30 ENCOUNTER — Other Ambulatory Visit: Payer: Self-pay

## 2019-07-30 ENCOUNTER — Telehealth: Payer: Self-pay | Admitting: *Deleted

## 2019-07-30 DIAGNOSIS — M5441 Lumbago with sciatica, right side: Secondary | ICD-10-CM

## 2019-07-30 MED ORDER — METHYLPREDNISOLONE 4 MG PO TBPK: ORAL_TABLET | ORAL | 0 refills | Status: DC

## 2019-07-30 MED ORDER — GABAPENTIN 100 MG PO CAPS: 100.0000 mg | ORAL_CAPSULE | Freq: Three times a day (TID) | ORAL | 0 refills | Status: DC

## 2019-07-30 NOTE — Telephone Encounter (Signed)
Spoke to patient. She reports low back pain in the middle. Denies lower extremity weakness numbness or bowel bladder incontinence. Pain is worse with movement. She reports that she has one day left of the prednisone. Has not had any relief with hydrocodone. Denies rash. Will give a prescription for Medrol dose pack to extend her steroid therapy. I will also send a prescription for gabapentin trial. She is advised To call if symptoms wor sen or if symptoms are not improved in one week. Advise patient she would need a follow-up office visit at that time. She is advise of red flags such a prompt ED visit.

## 2019-07-30 NOTE — Telephone Encounter (Signed)
Patient called back stating that she hasnt received a call back yet in regards to her back pain and what provider would advise. Patient requesting a callback today.

## 2019-07-30 NOTE — Telephone Encounter (Signed)
Copied from [place] [id number]. Topic: General - Call Back - No Documentation >> Jul 29, 2019  1:23 PM Erick Blinks wrote: Reason for CRM: Pt called to report that her back is still in pain. States that PCP instructed her to call back if this problem persisted per last visit.  Best contact: [phone] >> Jul 30, 2019  9:42 AM Reyne Dumas L wrote: Pt calling again.  States she didn't hear back yesterday.  Pt states that she is still in pain.

## 2019-07-30 NOTE — Telephone Encounter (Signed)
Patient also sent a mychart message as well.

## 2019-11-26 ENCOUNTER — Encounter: Payer: Self-pay | Admitting: Gastroenterology

## 2020-03-25 ENCOUNTER — Emergency Department (HOSPITAL_BASED_OUTPATIENT_CLINIC_OR_DEPARTMENT_OTHER)
Admission: EM | Admit: 2020-03-25 | Discharge: 2020-03-25 | Disposition: A | Discharge status: Home / Self Care | Payer: No Typology Code available for payment source | Attending: Emergency Medicine | Admitting: Emergency Medicine

## 2020-03-25 ENCOUNTER — Encounter (HOSPITAL_BASED_OUTPATIENT_CLINIC_OR_DEPARTMENT_OTHER): Payer: Self-pay | Admitting: Emergency Medicine

## 2020-03-25 ENCOUNTER — Other Ambulatory Visit: Payer: Self-pay

## 2020-03-25 DIAGNOSIS — M461 Sacroiliitis, not elsewhere classified: Secondary | ICD-10-CM | POA: Diagnosis not present

## 2020-03-25 DIAGNOSIS — F17210 Nicotine dependence, cigarettes, uncomplicated: Secondary | ICD-10-CM | POA: Insufficient documentation

## 2020-03-25 DIAGNOSIS — M545 Low back pain: Secondary | ICD-10-CM | POA: Diagnosis present

## 2020-03-25 LAB — URINALYSIS, ROUTINE W REFLEX MICROSCOPIC
Bilirubin Urine: NEGATIVE
Glucose, UA: NEGATIVE mg/dL
Ketones, ur: NEGATIVE mg/dL
Leukocytes,Ua: NEGATIVE
Nitrite: NEGATIVE
Protein, ur: NEGATIVE mg/dL
Specific Gravity, Urine: 1.02 (ref 1.005–1.030)
pH: 5.5 (ref 5.0–8.0)

## 2020-03-25 LAB — URINALYSIS, MICROSCOPIC (REFLEX)

## 2020-03-25 MED ORDER — HYDROCODONE-ACETAMINOPHEN 5-325 MG PO TABS: 2.0000 | ORAL_TABLET | Freq: Four times a day (QID) | ORAL | 0 refills | Status: DC | PRN

## 2020-03-25 MED ORDER — MELOXICAM 15 MG PO TABS: ORAL_TABLET | ORAL | 0 refills | Status: DC

## 2020-03-25 NOTE — ED Triage Notes (Signed)
Lower back pain for 2 weeks. Denies urinary sx or injury. Amb. NAD.

## 2020-03-25 NOTE — ED Provider Notes (Signed)
[facility] Provider Note: Jaime Spurling, MD, FACEP  CSN: [id number] MRN: [id number] ARRIVAL: 03/25/20 at [place]: [place]  03/25/20 5:43 AM Jaime Harris is a 64 y.o. female with a 2-week history of low back pain.  The pain is located in her right sacroiliac and radiates around to her right groin.  It is worse with ambulation.  She rates it as a 10 out of 10, sharp in nature.  She denies any numbness or weakness.  She has been taking ibuprofen 800 mg without relief.  She denies injury.  She is not having any dysuria or hematuria.   Past Medical History:  Diagnosis Date  . Aneurysm ([place]) 2020   supraclinoid R int carotid artery  . Chronic tension headaches   . Headache   . Hyperlipidemia    borderline  . Sickle cell trait ([place])   . Thyroid disease    enlarged thyroid  . Vertigo   . Vitamin D deficiency     Past Surgical History:  Procedure Laterality Date  . FOOT SURGERY Bilateral    Pt states she had bones removed from the sides of her feet and middle toes so she could walk better.  . TONSILLECTOMY  1975  . TUBAL LIGATION      Family History  Problem Relation Age of Onset  . Heart disease Mother        Investment banker, corporate  . Diabetes Mother   . Heart disease Paternal Grandmother        died of CHF  . Diabetes Paternal Grandmother   . Diabetes Brother   . Cancer Neg Hx   . Kidney disease Neg Hx   . Colon cancer Neg Hx   . Colon polyps Neg Hx   . Rectal cancer Neg Hx   . Stomach cancer Neg Hx     Social History   Tobacco Use  . Smoking status: Current Every Day Smoker    Packs/day: 0.50    Types: Cigarettes  . Smokeless tobacco: Never Used  Substance Use Topics  . Alcohol use: Yes    Comment: occasional  . Drug use: No    Prior to Admission medications   Medication Sig Start Date End Date Taking? Authorizing Provider  HYDROcodone-acetaminophen (NORCO) 5-325 MG tablet  Take 2 tablets by mouth every 6 (six) hours as needed for severe pain. 03/25/20   Isom Kochan, MD  meloxicam (MOBIC) 15 MG tablet Take 1 tablet daily as needed for pain. 03/25/20   Stachia Slutsky, MD  Multiple Vitamin (MULTIVITAMIN) tablet Take 1 tablet by mouth daily.    [provider]  gabapentin (NEURONTIN) 100 MG capsule Take 1 capsule (100 mg total) by mouth 3 (three) times daily. 07/30/19 03/25/20  Debbrah Alar, NP    Allergies Patient has no known allergies.   REVIEW OF SYSTEMS  Negative except as noted here or in the History of Present Illness.   PHYSICAL EXAMINATION  Initial Vital Signs Blood pressure (!) 141/75, pulse 80, temperature 98.2 F (36.8 C), temperature source Oral, resp. rate 16, height 5\' 5"  (1.651 m), weight 81.6 kg, SpO2 98 %.  Examination General: Well-developed, well-nourished female in no acute distress; appearance consistent with age of record HENT: normocephalic; atraumatic Eyes: pupils equal, round and reactive to light; extraocular muscles intact; arcus senilis bilaterally Neck: supple Heart: regular rate and rhythm Lungs: clear to auscultation bilaterally Abdomen: soft; nondistended; nontender; bowel sounds  present Back: Right SI joint tenderness Extremities: No deformity; full range of motion; pulses normal Neurologic: Awake, alert and oriented; motor function intact in all extremities and symmetric; no facial droop Skin: Warm and dry Psychiatric: Normal mood and affect   RESULTS  Summary of this visit's results, reviewed and interpreted by myself:   EKG Interpretation  Date/Time:    Ventricular Rate:    PR Interval:    QRS Duration:   QT Interval:    QTC Calculation:   R Axis:     Text Interpretation:        Laboratory Studies: Results for orders placed or performed during the hospital encounter of 03/25/20 (from the past 24 hour(s))  Urinalysis, Routine w reflex microscopic     Status: Abnormal   Collection Time:  03/25/20  2:06 AM  Result Value Ref Range   Color, Urine YELLOW YELLOW   APPearance CLEAR CLEAR   Specific Gravity, Urine 1.020 1.005 - 1.030   pH 5.5 5.0 - 8.0   Glucose, UA NEGATIVE NEGATIVE mg/dL   Hgb urine dipstick SMALL (A) NEGATIVE   Bilirubin Urine NEGATIVE NEGATIVE   Ketones, ur NEGATIVE NEGATIVE mg/dL   Protein, ur NEGATIVE NEGATIVE mg/dL   Nitrite NEGATIVE NEGATIVE   Leukocytes,Ua NEGATIVE NEGATIVE  Urinalysis, Microscopic (reflex)     Status: Abnormal   Collection Time: 03/25/20  2:06 AM  Result Value Ref Range   RBC / HPF 0-5 0 - 5 RBC/hpf   WBC, UA 0-5 0 - 5 WBC/hpf   Bacteria, UA MANY (A) NONE SEEN   Squamous Epithelial / LPF 6-10 0 - 5   Imaging Studies: No results found.  ED COURSE and MDM  Nursing notes, initial and subsequent vitals signs, including pulse oximetry, reviewed and interpreted by myself.  Vitals:   03/25/20 0200 03/25/20 0201  BP: (!) 141/75   Pulse: 80   Resp: 16   Temp: 98.2 F (36.8 C)   TempSrc: Oral   SpO2: 98%   Weight:  81.6 kg  Height:  5\' 5"  (1.651 m)   Medications - No data to display  The patient's presentation is consistent with sacroiliitis which typically causes pain in the SI joint radiating around to the groin.  We will switch her to Mobic and a short course of narcotic and refer her to her PCP if she is not improving.   PROCEDURES  Procedures   ED DIAGNOSES     ICD-10-CM   1. Sacroiliitis ([place])  M46.1        [name], MD 03/25/20 [phone]

## 2020-04-02 ENCOUNTER — Other Ambulatory Visit: Payer: Self-pay

## 2020-04-02 ENCOUNTER — Ambulatory Visit (INDEPENDENT_AMBULATORY_CARE_PROVIDER_SITE_OTHER): Payer: No Typology Code available for payment source | Admitting: Family

## 2020-04-02 ENCOUNTER — Telehealth: Payer: Self-pay | Admitting: Family

## 2020-04-02 ENCOUNTER — Encounter: Payer: Self-pay | Admitting: Family

## 2020-04-02 VITALS — BP 138/82 | HR 70 | Temp 97.8°F | Resp 16 | Ht 65.0 in | Wt 181.0 lb

## 2020-04-02 DIAGNOSIS — G8929 Other chronic pain: Secondary | ICD-10-CM

## 2020-04-02 DIAGNOSIS — M5440 Lumbago with sciatica, unspecified side: Secondary | ICD-10-CM

## 2020-04-02 DIAGNOSIS — L304 Erythema intertrigo: Secondary | ICD-10-CM

## 2020-04-02 DIAGNOSIS — Z720 Tobacco use: Secondary | ICD-10-CM

## 2020-04-02 DIAGNOSIS — R350 Frequency of micturition: Secondary | ICD-10-CM | POA: Diagnosis not present

## 2020-04-02 DIAGNOSIS — Z0000 Encounter for general adult medical examination without abnormal findings: Secondary | ICD-10-CM | POA: Diagnosis not present

## 2020-04-02 MED ORDER — NYSTATIN 100000 UNIT/GM EX POWD
1.0000 | Freq: Two times a day (BID) | CUTANEOUS | 2 refills | Status: DC
Start: 2020-04-02 — End: 2020-10-12

## 2020-04-02 MED ORDER — CHANTIX STARTING MONTH PAK 0.5 MG X 11 & 1 MG X 42 PO TABS: ORAL_TABLET | ORAL | 0 refills | Status: DC

## 2020-04-02 NOTE — Progress Notes (Addendum)
Subjective:    Patient ID: Jaime Harris, female    DOB: 1956-04-17, 64 y.o.   MRN: [id number]  HPI   Patient is a 64 yr old female who presents today   Patient presents today for complete physical.  Immunizations: had J and J vaccine, Tdap 2016 Declines shingrix Diet: reports healthy diet Wt Readings from Last 3 Encounters:  04/02/20 181 lb (82.1 kg)  03/25/20 180 lb (81.6 kg)  07/24/19 182 lb (82.6 kg)  Exercise: not exercising Colonoscopy: due Dexa: 04/2019 Pap Smear: due 2022 Mammogram: due in August Vision:  Up to date Dental: up to date  Tobacco abuse- Has tried chantix in the past.     Review of Systems  Constitutional: Negative for unexpected weight change.  HENT: Negative for hearing loss and nosebleeds.   Eyes: Negative for visual disturbance.  Respiratory: Positive for shortness of breath (some sob, she feels it is due to smoking). Negative for cough.   Cardiovascular: Negative for chest pain.  Gastrointestinal: Negative for constipation and diarrhea.  Genitourinary: Positive for frequency. Negative for dysuria and hematuria.  Musculoskeletal: Positive for back pain. Negative for arthralgias and myalgias.  Skin: Negative for rash.  Neurological: Negative for headaches.  Hematological: Negative for adenopathy.  Psychiatric/Behavioral:       Gets down sometimes.      Past Medical History:  Diagnosis Date  . Aneurysm ([place]) 2020   supraclinoid R int carotid artery  . Chronic tension headaches   . Headache   . Hyperlipidemia    borderline  . Sickle cell trait ([place])   . Thyroid disease    enlarged thyroid  . Vertigo   . Vitamin D deficiency      Social History   Socioeconomic History  . Marital status: Single    Spouse name: Not on file  . Number of children: 3  . Years of education: Not on file  . Highest education level: High school graduate  Occupational History    Comment: Cashier  Tobacco Use  . Smoking status: Current Every Day Smoker     Packs/day: 0.75    Types: Cigarettes  . Smokeless tobacco: Never Used  Substance and Sexual Activity  . Alcohol use: Yes    Comment: occasional  . Drug use: No  . Sexual activity: Not on file  Other Topics Concern  . Not on file  Social History Narrative   Makes eye glasses.     Single   GED   She has 3 children   55- Son [name]- lives locally- he has one son   14- Daughter [name]- 7 children (4 of these children live with the pt- she is a primary care giver)   [place]- lives in [place] 1 daughter   Enjoys reading   Caffeine- coffee, 4 cups daily, soda every other day   Social Determinants of Health   Financial Resource Strain:   . Difficulty of Paying Living Expenses:   Food Insecurity:   . Worried About Charity fundraiser in the Last Year:   . Arboriculturist in the Last Year:   Transportation Needs:   . Film/video editor (Medical):   Marland Kitchen Lack of Transportation (Non-Medical):   Physical Activity:   . Days of Exercise per Week:   . Minutes of Exercise per Session:   Stress:   . Feeling of Stress :   Social Connections:   . Frequency of Communication with Friends and Family:   .  Frequency of Social Gatherings with Friends and Family:   . Attends Religious Services:   . Active Member of Clubs or Organizations:   . Attends Archivist Meetings:   Marland Kitchen Marital Status:   Intimate Partner Violence:   . Fear of Current or Ex-Partner:   . Emotionally Abused:   Marland Kitchen Physically Abused:   . Sexually Abused:     Past Surgical History:  Procedure Laterality Date  . FOOT SURGERY Bilateral    Pt states she had bones removed from the sides of her feet and middle toes so she could walk better.  . TONSILLECTOMY  1975  . TUBAL LIGATION      Family History  Problem Relation Age of Onset  . Heart disease Mother        Investment banker, corporate  . Diabetes Mother   . Heart disease Paternal Grandmother        died of CHF  . Diabetes Paternal Grandmother    . Diabetes Brother   . Cancer Neg Hx   . Kidney disease Neg Hx   . Colon cancer Neg Hx   . Colon polyps Neg Hx   . Rectal cancer Neg Hx   . Stomach cancer Neg Hx     No Known Allergies  Current Outpatient Medications on File Prior to Visit  Medication Sig Dispense Refill  . meloxicam (MOBIC) 15 MG tablet Take 1 tablet daily as needed for pain. 15 tablet 0  . [DISCONTINUED] gabapentin (NEURONTIN) 100 MG capsule Take 1 capsule (100 mg total) by mouth 3 (three) times daily. 60 capsule 0   No current facility-administered medications on file prior to visit.    BP (!) 138/82 (BP Location: Right Arm, Patient Position: Sitting, Cuff Size: Small)   Pulse 70   Temp 97.8 F (36.6 C) (Oral)   Resp 16   Ht 5\' 5"  (1.651 m)   Wt 181 lb (82.1 kg)   SpO2 100%   BMI 30.12 kg/m       Objective:   Physical Exam  Physical Exam  Constitutional: She is oriented to person, place, and time. She appears well-developed and well-nourished. No distress.  HENT:  Head: Normocephalic and atraumatic.  Right Ear: Tympanic membrane and ear canal normal.  Left Ear: Tympanic membrane and ear canal normal.  Mouth/Throat:not examined- pt wearing mask Eyes: Pupils are equal, round, and reactive to light. No scleral icterus.  Neck: Normal range of motion. No thyromegaly present.  Cardiovascular: Normal rate and regular rhythm.   No murmur heard. Pulmonary/Chest: Effort normal and breath sounds normal. No respiratory distress. He has no wheezes. She has no rales. She exhibits no tenderness.  Abdominal: Soft. Bowel sounds are normal. She exhibits no distension and no mass. There is no tenderness. There is no rebound and no guarding.  Musculoskeletal: She exhibits no edema.  Lymphadenopathy:    She has no cervical adenopathy.  Neurological: She is alert and oriented to person, place, and time. She has normal patellar reflexes. She exhibits normal muscle tone. Coordination normal.  Skin: Skin is warm and  dry. hyperpigmented rash noted beneath both breasts Psychiatric: She has a normal mood and affect. Her behavior is normal. Judgment and thought content normal.  Breasts: Examined lying Right: Without masses, retractions, discharge or axillary adenopathy.  Left: Without masses, retractions, discharge or axillary adenopathy.            Assessment & Plan:   Preventative care- discussed diet/exercise and weight loss. Declines shingrix.  Due  for colo, mammo.   Urinary frequency- obtain urine culture.  Intertrigo- rx with nystatin.   Chronic low back pain- refer to orthopedics.  Interferes with her ability to stand at work sometimes.  Tobacco abuse- counseling provided. Would like to retry chantix. rx sent.     Assessment & Plan:  Initial bp was elevated upon arrival, but follow up BP was improved.  BP Readings from Last 3 Encounters:  04/02/20 (!) 138/82  03/25/20 140/74  07/24/19 128/80

## 2020-04-02 NOTE — Telephone Encounter (Signed)
Caller : Jaime Harris  Call Back # [phone]  Patient states GI office called her to schedule coloscopy but per their office patient is not eligible to receive next coloscopy until 01/2022.

## 2020-04-02 NOTE — Patient Instructions (Signed)
Please call [facility] to schedule and appointment- Phone: [phone] Start chantix- send me a note in a few weeks to let me know how you are doing.

## 2020-04-03 LAB — URINE CULTURE
MICRO NUMBER:: 10742630
SPECIMEN QUALITY:: ADEQUATE

## 2020-04-06 NOTE — Telephone Encounter (Signed)
Hi Dr. Ardis Hughs, This is a patient of yours who had colo 2016- your recall letter recommended 5 year follow up. Would you mind double checking with your office please on this? They are stating she can't have follow up colo until 2023.  Thanks!

## 2020-04-06 NOTE — Telephone Encounter (Signed)
2023 is the correct recall time based on the newest multisociety national guidelines.  I did send her a letter earlier this year explaining that however I am happy to discuss it with her in person if she prefers.  Let me know.

## 2020-04-06 NOTE — Telephone Encounter (Signed)
That should be fine- I just wanted to double check. Thanks!

## 2020-04-09 ENCOUNTER — Telehealth: Payer: Self-pay | Admitting: Family

## 2020-04-09 NOTE — Telephone Encounter (Signed)
Form in provider's folder, patient need to be made aware provider not here until 8-16

## 2020-04-09 NOTE — Telephone Encounter (Signed)
Caller: Jaime Harris Call back phone number: [phone]  Wants to know if you received FMLA paperwork

## 2020-04-12 NOTE — Telephone Encounter (Signed)
Caller: Eather Call back # [phone]   Patient called back stating she needs paperwork as soonest possible. Was wondering if another provider could fill up the paperwork.

## 2020-04-14 ENCOUNTER — Ambulatory Visit (INDEPENDENT_AMBULATORY_CARE_PROVIDER_SITE_OTHER): Payer: No Typology Code available for payment source

## 2020-04-14 ENCOUNTER — Ambulatory Visit (INDEPENDENT_AMBULATORY_CARE_PROVIDER_SITE_OTHER): Payer: No Typology Code available for payment source | Admitting: Orthopaedic Surgery

## 2020-04-14 ENCOUNTER — Encounter: Payer: Self-pay | Admitting: Orthopaedic Surgery

## 2020-04-14 VITALS — BP 132/80 | HR 82 | Ht 65.0 in | Wt 184.0 lb

## 2020-04-14 DIAGNOSIS — G8929 Other chronic pain: Secondary | ICD-10-CM

## 2020-04-14 DIAGNOSIS — M545 Low back pain, unspecified: Secondary | ICD-10-CM | POA: Insufficient documentation

## 2020-04-14 DIAGNOSIS — M5136 Other intervertebral disc degeneration, lumbar region: Secondary | ICD-10-CM | POA: Diagnosis not present

## 2020-04-14 NOTE — Addendum Note (Signed)
Addended by: Michae Kava B on: 04/14/2020 10:27 AM   Modules accepted: Orders

## 2020-04-14 NOTE — Progress Notes (Signed)
Office Visit Note   Patient: Jaime Harris           Date of Birth: 1956/08/11           MRN: [id number] Visit Date: 04/14/2020              Requested by: Debbrah Alar, NP [address] [place],  [place] [address] PCP: Debbrah Alar, NP   Assessment & Plan: Visit Diagnoses:  1. Chronic bilateral low back pain without sciatica   2. Other intervertebral disc degeneration, lumbar region     Plan: We will set up for some physical therapy she lives in [place] is close to [place].  I will check her back again in 5 weeks.  She is having persistent problems and does not respond to continued conservative treatment we will consider diagnostic MRI imaging.  She can continue the Mobic she was taking.  Follow-Up Instructions: Return in about 5 weeks (around 05/19/2020).   Orders:  Orders Placed This Encounter  Procedures  . XR Lumbar Spine Complete   No orders of the defined types were placed in this encounter.     Procedures: No procedures performed   Clinical Data: No additional findings.   Subjective: Chief Complaint  Patient presents with  . Lower Back - Pain    HPI 64 year old female has had pain for about a year in her back that radiates into her hip sometimes into her right groin.  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  She has noticed significant increase in pain in the last 3 weeks no falling.  She does not recall any specific severe injury.  No fever chills no bowel bladder associated symptoms.  She does have history of IBS also has had carpal tunnel.  She is able to ambulate she has problems when she goes from sitting to standing.  When she stands all day she has had increased symptoms  Review of Systems all other systems are -14 point as it pertains to HPI.   Objective: Vital Signs: BP 132/80 (BP Location: Left Arm, Patient Position: Sitting, Cuff Size: Normal)   Pulse 82   Ht 5\' 5"   (1.651 m)   Wt 184 lb (83.5 kg)   BMI 30.62 kg/m   Physical Exam Constitutional:      Appearance: She is well-developed.  HENT:     Head: Normocephalic.     Right Ear: External ear normal.     Left Ear: External ear normal.  Eyes:     Pupils: Pupils are equal, round, and reactive to light.  Neck:     Thyroid: No thyromegaly.     Trachea: No tracheal deviation.  Cardiovascular:     Rate and Rhythm: Normal rate.  Pulmonary:     Effort: Pulmonary effort is normal.  Abdominal:     Palpations: Abdomen is soft.  Skin:    General: Skin is warm and dry.  Neurological:     Mental Status: She is alert and oriented to person, place, and time.  Psychiatric:        Behavior: Behavior normal.     Ortho Exam patient's of slow getting from sitting to standing she is able to reach forward flexion with knee straight fingertips to ankles.  Some recurrent pain as she resumes upright position sciatic notch tenderness right and left some tenderness paralumbar L4-S1 in the midline.  No atrophy.  Trochanteric bursa is tender right and  left slightly worse on the left side.  Negative logroll the hips right and left.  Some pain with popliteal compression and straight leg raising 90 degrees.  Anterior tib EHL heel and toe walking is normal.  No atrophy no pitting edema.  Specialty Comments:  No specialty comments available.  Imaging: XR Lumbar Spine Complete  Result Date: 04/14/2020 AP lateral lumbar spine 9 radiographs are obtained and reviewed.  This shows multilevel disc space narrowing without spondylolisthesis negative for acute fractures.  Disc base narrowing L2-3 through L4-5.  L5-S1 is preserved.  Facet arthropathy is noted.  Slight calcification of the abdominal aorta. Impression: Lumbar disc degeneration with narrowing L to the L5.    PMFS History: Patient Active Problem List   Diagnosis Date Noted  . Other intervertebral disc degeneration, lumbar region 04/14/2020  . Obstructive sleep  apnea 11/04/2018  . Headache 04/02/2015  . Chronic tension-type headache, intractable 01/26/2015  . Orthostatic hypotension 01/26/2015  . Vitamin D deficiency 12/30/2014  . Migraine 12/25/2014  . Dizziness 12/25/2014  . Tobacco abuse 12/25/2014  . Carpal tunnel syndrome 12/25/2014  . Preventative health care 12/25/2014  . Thyromegaly 11/16/2014  . BPV (benign positional vertigo) 11/16/2014  . Tension headache 11/16/2014  . IBS (irritable bowel syndrome) 11/16/2014   Past Medical History:  Diagnosis Date  . Aneurysm ([place]) 2020   supraclinoid R int carotid artery  . Chronic tension headaches   . Headache   . Hyperlipidemia    borderline  . Sickle cell trait ([place])   . Thyroid disease    enlarged thyroid  . Vertigo   . Vitamin D deficiency     Family History  Problem Relation Age of Onset  . Heart disease Mother        Investment banker, corporate  . Diabetes Mother   . Heart disease Paternal Grandmother        died of CHF  . Diabetes Paternal Grandmother   . Diabetes Brother   . Cancer Neg Hx   . Kidney disease Neg Hx   . Colon cancer Neg Hx   . Colon polyps Neg Hx   . Rectal cancer Neg Hx   . Stomach cancer Neg Hx     Past Surgical History:  Procedure Laterality Date  . FOOT SURGERY Bilateral    Pt states she had bones removed from the sides of her feet and middle toes so she could walk better.  . TONSILLECTOMY  1975  . TUBAL LIGATION     Social History   Occupational History    Comment: Cashier  Tobacco Use  . Smoking status: Current Every Day Smoker    Packs/day: 0.75    Types: Cigarettes  . Smokeless tobacco: Never Used  Substance and Sexual Activity  . Alcohol use: Yes    Comment: occasional  . Drug use: No  . Sexual activity: Not on file

## 2020-04-16 ENCOUNTER — Other Ambulatory Visit: Payer: Self-pay

## 2020-04-16 ENCOUNTER — Ambulatory Visit (INDEPENDENT_AMBULATORY_CARE_PROVIDER_SITE_OTHER): Payer: No Typology Code available for payment source | Admitting: Medical

## 2020-04-16 VITALS — BP 141/79 | HR 88 | Temp 98.0°F | Resp 16 | Ht 65.0 in | Wt 180.0 lb

## 2020-04-16 DIAGNOSIS — H9201 Otalgia, right ear: Secondary | ICD-10-CM | POA: Diagnosis not present

## 2020-04-16 MED ORDER — AMOXICILLIN-POT CLAVULANATE 875-125 MG PO TABS: 1.0000 | ORAL_TABLET | Freq: Two times a day (BID) | ORAL | 0 refills | Status: DC

## 2020-04-16 MED FILL — AMOX-CLAV 875-125 MG TABLET: 875-125 | 10 days supply | Qty: 20 | Fill #0

## 2020-04-16 NOTE — Progress Notes (Signed)
Subjective:    Patient ID: Jaime Harris, female    DOB: [birth year], 64 y.o.   MRN: [id number]  HPI  Pt in for rt posterior auricle area pain for one week. Pt has some pain about 10 days ago when had cpe/wellness. At that point was very minimal. But her pain is gradually getting worse. Last 2 days moderate to severe pain. States pain keeping her up when she tried to sleep.  No nasal congestion, no allergy symptoms and no fever or chills.  No teeth present.    Review of Systems  Constitutional: Negative for chills, fatigue and fever.  HENT: Positive for ear pain. Negative for congestion, facial swelling, hearing loss, postnasal drip, rhinorrhea, sinus pressure, sinus pain and sneezing.   Respiratory: Negative for cough, chest tightness, shortness of breath and wheezing.   Cardiovascular: Negative for chest pain and palpitations.  Gastrointestinal: Negative for abdominal pain.  Musculoskeletal: Negative for back pain.  Neurological: Negative for dizziness and headaches.  Hematological: Negative for adenopathy. Does not bruise/bleed easily.    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   supraclinoid R int carotid artery   Chronic tension headaches    Headache    Hyperlipidemia    borderline   Sickle cell trait (HCC)    Thyroid disease    enlarged thyroid   Vertigo    Vitamin D deficiency      Social History   Socioeconomic History   Marital status: Single    Spouse name: Not on file   Number of children: 3   Years of education: Not on file   Highest education level: High school graduate  Occupational History    Comment: Cashier  Tobacco Use   Smoking status: Current Every Day Smoker    Packs/day: 0.75    Types: Cigarettes   Smokeless tobacco: Never Used  Substance and Sexual Activity   Alcohol use: Yes    Comment: occasional   Drug use: No   Sexual activity: Not on file  Other Topics Concern   Not on file  Social History  Narrative   Makes eye glasses.     Single   GED   She has 3 children   69- Son [name]- lives locally- he has one son   88- Daughter [name]- 7 children (4 of these children live with the pt- she is a primary care giver)   [place]- lives in [place]  1 daughter   Enjoys reading   Caffeine- coffee, 4 cups daily, soda every other day   Social Determinants of Health   Financial Resource Strain:    Difficulty of Paying Living Expenses:   Food Insecurity:    Worried About Charity fundraiser in the Last Year:    Arboriculturist in the Last Year:   Transportation Needs:    Film/video editor (Medical):    Lack of Transportation (Non-Medical):   Physical Activity:    Days of Exercise per Week:    Minutes of Exercise per Session:   Stress:    Feeling of Stress :   Social Connections:    Frequency of Communication with Friends and Family:    Frequency of Social Gatherings with Friends and Family:    Attends Religious Services:    Active Member of Clubs or Organizations:    Attends Archivist Meetings:    Marital Status:   Intimate Partner Violence:    Fear of Current or Ex-Partner:  Emotionally Abused:    Physically Abused:    Sexually Abused:     Past Surgical History:  Procedure Laterality Date   FOOT SURGERY Bilateral    Pt states she had bones removed from the sides of her feet and middle toes so she could walk better.   TONSILLECTOMY  1975   TUBAL LIGATION      Family History  Problem Relation Age of Onset   Heart disease Mother        pacemaker/defibrillator   Diabetes Mother    Heart disease Paternal Grandmother        died of CHF   Diabetes Paternal Grandmother    Diabetes Brother    Cancer Neg Hx    Kidney disease Neg Hx    Colon cancer Neg Hx    Colon polyps Neg Hx    Rectal cancer Neg Hx    Stomach cancer Neg Hx     No Known Allergies  Current Outpatient Medications on File Prior to Visit    Medication Sig Dispense Refill   ibuprofen (ADVIL) 800 MG tablet Take 800 mg by mouth every 6 (six) hours as needed.     meloxicam (MOBIC) 15 MG tablet Take 1 tablet daily as needed for pain. 15 tablet 0   nystatin (MYCOSTATIN/NYSTOP) powder Apply 1 application topically 2 (two) times daily. 30 g 2   varenicline (CHANTIX STARTING MONTH PAK) 0.5 MG X 11 & 1 MG X 42 tablet one 0.5 mg tab by mouth 1 x  daily for 3 days, then increase to one 0.5 mg tablet 2 x daily for 4 days, then increase to one 1 mg tab bid 53 tablet 0   [DISCONTINUED] gabapentin (NEURONTIN) 100 MG capsule Take 1 capsule (100 mg total) by mouth 3 (three) times daily. 60 capsule 0   No current facility-administered medications on file prior to visit.    BP (!) 141/79    Pulse 88    Temp 98 F (36.7 C) (Oral)    Resp 16    Ht 5\' 5"  (1.651 m)    Wt 180 lb (81.6 kg)    SpO2 100%    BMI 29.95 kg/m       Objective:   Physical Exam  General  Mental Status - Alert. General Appearance - Well groomed. Not in acute distress.  Skin Rashes- No Rashes.  HEENT Head- Normal. Ear Auditory Canal - Left- Normal. Right - Normal.Tympanic Membrane- Left- Normal. Right- faint pinkish tm at best  No tragal tenderess. Faint tender. Just below ear. Eye Sclera/Conjunctiva- Left- Normal. Right- Normal. Nose & Sinuses Nasal Mucosa- Left- not  Boggy and Congested. Right-not   Boggy and  Congested.Bilateral no maxillary and no frontal sinus pressure. Mouth & Throat Lips: Upper Lip- Normal: no dryness, cracking, pallor, cyanosis, or vesicular eruption. Lower Lip-Normal: no dryness, cracking, pallor, cyanosis or vesicular eruption. No teeth present. Buccal Mucosa- Bilateral- No Aphthous ulcers. Oropharynx- No Discharge or Erythema. Tonsils: Characteristics- Bilateral- No Erythema or Congestion. Size/Enlargement- Bilateral- No enlargement. Discharge- bilateral-None.  Neck Neck- Supple. No Masses.   Chest and Lung  Exam Auscultation: Breath Sounds:-Clear even and unlabored.  Cardiovascular Auscultation:Rythm- Regular, rate and rhythm. Murmurs & Other Heart Sounds:Ausculatation of the heart reveal- No Murmurs.  Lymphatic Head & Neck General Head & Neck Lymphatics: Bilateral: Description- No Localized lymphadenopathy.       Assessment & Plan:  You have recent ear region pain with increasing severity. No obvious regional infection or swollen lymph node.  Pain just below ear on palpation but not in mastoid bone. Tympanic membrane faint pink. Will treat for possible early ear infection.  Rx antibiotic augmentin.  Can continue meloxcam. Can add tylenol as discussed.  Follow up in 10 days with pcp or as needed.  Want you to follow up to make sure you feel better. If not then consider refer to ENT. Maybe imaging or labs.  Time spent with patient today was 25  minutes which consisted of chart revdiew, discussing diagnosis, potential work up, treatment and documentation.

## 2020-04-16 NOTE — Telephone Encounter (Signed)
Pt came in office stating is wanting to know if another provider could fill out her FMLA documents, since pt is needing document ASAP. Please advise. Pt would like to be called with a respond to TEL [phone].

## 2020-04-16 NOTE — Patient Instructions (Signed)
You have recent ear region pain with increasing severity. No obvious regional infection or swollen lymph node. Pain just below ear on palpation but not in mastoid bone. Tympanic membrane faint pink. Will treat for possible early ear infection.  Rx antibiotic augmentin.  Can continue meloxcam. Can add tylenol as discussed.  Follow up in 10 days with pcp or as needed.  Want you to follow up to make sure you feel better. If not then consider refer to ENT. Maybe imaging or labs.

## 2020-04-22 ENCOUNTER — Other Ambulatory Visit: Payer: Self-pay

## 2020-04-22 ENCOUNTER — Ambulatory Visit: Payer: No Typology Code available for payment source | Attending: Orthopaedic Surgery | Admitting: Physical Therapy

## 2020-04-22 ENCOUNTER — Encounter: Payer: Self-pay | Admitting: Physical Therapy

## 2020-04-22 DIAGNOSIS — G8929 Other chronic pain: Secondary | ICD-10-CM | POA: Insufficient documentation

## 2020-04-22 DIAGNOSIS — R29898 Other symptoms and signs involving the musculoskeletal system: Secondary | ICD-10-CM | POA: Insufficient documentation

## 2020-04-22 DIAGNOSIS — M25551 Pain in right hip: Secondary | ICD-10-CM | POA: Insufficient documentation

## 2020-04-22 DIAGNOSIS — M25651 Stiffness of right hip, not elsewhere classified: Secondary | ICD-10-CM | POA: Diagnosis present

## 2020-04-22 DIAGNOSIS — M545 Low back pain: Secondary | ICD-10-CM | POA: Diagnosis present

## 2020-04-22 NOTE — Therapy (Signed)
[facility] [address]  [place], [place], [address] Phone: [phone]   Fax:  [phone]  Physical Therapy Evaluation  Patient Details  Name: Jaime Harris MRN: [id number] Date of Birth: Dec 19, 1955 Referring Provider (PT): Rodell Perna, MD   Encounter Date: 04/22/2020   PT End of Session - 04/22/20 1024    Visit Number 1    Number of Visits 13    Date for PT Re-Evaluation 06/03/20    Authorization Type [employer], VL: [id number]    Authorization - Visit Number 1    Authorization - Number of Visits 20    PT Start Time [phone]    PT Stop Time 1009    PT Time Calculation (min) 38 min    Activity Tolerance Patient tolerated treatment well;Patient limited by pain    Behavior During Therapy [facility] for tasks assessed/performed           Past Medical History:  Diagnosis Date  . Aneurysm ([place]) 2020   supraclinoid R int carotid artery  . Chronic tension headaches   . Headache   . Hyperlipidemia    borderline  . Sickle cell trait ([place])   . Thyroid disease    enlarged thyroid  . Vertigo   . Vitamin D deficiency     Past Surgical History:  Procedure Laterality Date  . FOOT SURGERY Bilateral    Pt states she had bones removed from the sides of her feet and middle toes so she could walk better.  . TONSILLECTOMY  1975  . TUBAL LIGATION      There were no vitals filed for this visit.    Subjective Assessment - 04/22/20 0933    Subjective Patient reports LBP for the past >1 year but worse within the last 3 weeks. Unsure of initial cause or exacerbation, but does stand at work and that seems to make it worse. Pain occurs in lower central LB and in the R groin. Groin pain worse when standing up.  LBP also worse when laying on R side or sitting for prolonged time. Denies N&T or B&B changes. Better with meloxicam.    Pertinent History vertigo, thyroid disease, HLD, HA, R internal carotid aneurysm 2020, B foot surgery    Limitations  Sitting;Reading;Lifting;Standing;Walking;House hold activities    How long can you sit comfortably? 1 hour    How long can you stand comfortably? couple hours    How long can you walk comfortably? unsure    Diagnostic tests 04/14/20 lumbar xray: Disc base narrowing L2-3 through L4-5, Facet arthropathy is noted    Patient Stated Goals decrease pain    Currently in Pain? Yes    Pain Location Back    Pain Orientation Lower   central   Pain Descriptors / Indicators Throbbing;Pressure    Pain Type Chronic pain;Acute pain              [facility] PT Assessment - 04/22/20 0938      Assessment   Medical Diagnosis Chronic B LBP without sciatica, other IVD degeneration- lumbar region    Referring Provider (PT) Rodell Perna, MD    Onset Date/Surgical Date 04/23/19    Hand Dominance Right    Next MD Visit 05/25/20    Prior Therapy no      Precautions   Precautions None      Balance Screen   Has the patient fallen in the past 6 months No    Has the patient had a  decrease in activity level because of a fear of falling?  No    Is the patient reluctant to leave their home because of a fear of falling?  No      Home Environment   Living Environment Private residence    Living Arrangements Other relatives   granddaughters   Available Help at Discharge Family    Type of [place] to enter    Entrance Stairs-Number of Steps 6    Entrance Stairs-Rails [place] One level      Prior Function   Level of Independence Independent    Vocation Full time employment    Vocation Requirements standing in place    Leisure bowling, walking      Cognition   Overall Cognitive Status Within Functional Limits for tasks assessed      Observation/Other Assessments   Focus on Therapeutic Outcomes (FOTO)  lumbar: 51 (49% limited, 39% predicted)      Sensation   Light Touch Appears Intact      Coordination   Gross Motor Movements are Fluid and Coordinated Yes       Posture/Postural Control   Posture/Postural Control Postural limitations    Postural Limitations Rounded Shoulders      ROM / Strength   AROM / PROM / Strength AROM;Strength;PROM      AROM   AROM Assessment Site Lumbar;Hip    Right/Left Hip Right;Left    Right Hip External Rotation  40    Right Hip Internal Rotation  --   unable d/t "catch" in R anterior hip   Left Hip External Rotation  35    Left Hip Internal Rotation  39    Lumbar Flexion ankles   moderate LBP   Lumbar Extension mild-moderately limited   feeling a "catch"   Lumbar - Right Side Bend jt line    Lumbar - Left Side Bend jt line   moderate L LBP   Lumbar - Right Rotation moderately limited   feeling a "catch"   Lumbar - Left Rotation mildly limited   feeling a "catch"     Strength   Strength Assessment Site Hip;Knee;Ankle    Right/Left Hip Right;Left    Right Hip Flexion 4+/5    Right Hip ABduction 4+/5    Right Hip ADduction 4/5   groin pain   Left Hip Flexion 4+/5    Left Hip ABduction 4+/5    Left Hip ADduction 4+/5    Right/Left Knee Right;Left    Right Knee Flexion 4+/5    Right Knee Extension 4+/5    Left Knee Flexion 4/5    Left Knee Extension 4+/5    Right/Left Ankle Right;Left    Right Ankle Dorsiflexion 5/5    Right Ankle Plantar Flexion 4+/5    Left Ankle Dorsiflexion 5/5    Left Ankle Plantar Flexion 4+/5      Flexibility   Soft Tissue Assessment /Muscle Length yes    Hamstrings R mildly tight, L WNL    Quadriceps B mildly tight in mod thomas with pain in LB    Piriformis B mildly tight in KTOS and fig 4      Palpation   Palpation comment TTP over B PSIS and lumbar spinous processes, TTP over B proximal glutes, R piriformis, R hip flexors      Ambulation/Gait   Assistive device None    Gait Pattern Step-through pattern;Step-to pattern;Trunk flexed;Lateral trunk lean to right;Lateral  trunk lean to left;Decreased step length - right;Decreased step length - left    Ambulation Surface  Level;Indoor    Gait velocity decreased                      Objective measurements completed on examination: See above findings.               PT Education - 04/22/20 1024    Education Details prognosis, POC, HEP    Person(s) Educated Patient    Methods Explanation;Demonstration;Tactile cues;Verbal cues;Handout    Comprehension Verbalized understanding;Returned demonstration            PT Short Term Goals - 04/22/20 1029      PT SHORT TERM GOAL #1   Title Patient to be independent with initial HEP.    Time 3    Period Weeks    Status New    Target Date 05/13/20             PT Long Term Goals - 04/22/20 1030      PT LONG TERM GOAL #1   Title Patient to be independent with advanced HEP.    Time 6    Period Weeks    Status New    Target Date 06/03/20      PT LONG TERM GOAL #2   Title Patient to demonstrate lumbar AROM WFL and without pain limiting.    Time 6    Period Weeks    Status New    Target Date 06/03/20      PT LONG TERM GOAL #3   Title Patient to demonstrate R hip AROM WFL and without complaints at end of session.    Time 6    Period Weeks    Status New    Target Date 06/03/20      PT LONG TERM GOAL #4   Title Patient to demonstrate B LE strength >/=4+/5.    Time 6    Period Weeks    Status New    Target Date 06/03/20      PT LONG TERM GOAL #5   Title Patient to report a full day of work without pain.    Time 8    Period Weeks    Status New    Target Date 06/03/20                  Plan - 04/22/20 1025    Clinical Impression Statement Patient is a 64y/o F presenting to OPPT with c/o insidious midline LB and R groin pain of >1 year duration, with recent exacerbation in the past 3 weeks. Groin pain worse when standing up from sitting. LBP worse with prolonged standing which she must do at work, prolonged sitting, and R sidelying. Denies N&T or B&B changes. Patient today presenting with rounded shoulders,  painful R hip and lumbar AROM, decreased R hip adduction and L knee flexion strength, tightness and pain with LE stretching, TTP over B PSIS, midline of lumbar spine, B proximal glutes, R piriformis, R hip flexor, and gait deviations. Patient was educated on gentle stretching and strengthening HEP- patient reported understanding. Would benefit from skilled PT services 2x/week for 6 weeks to address aforementioned impairments.    Personal Factors and Comorbidities Age;Comorbidity 3+;Fitness;Past/Current Experience;Profession;Time since onset of injury/illness/exacerbation    Comorbidities vertigo, thyroid disease, HLD, HA, R internal carotid aneurysm 2020, B foot surgery    Examination-Activity Limitations Sit;Bed Mobility;Sleep;Squat;Bend;Caring for Others;Stairs;Carry;Stand;Toileting;Dressing;Transfers;Hygiene/Grooming;Lift;Locomotion Level;Reach Overhead  Examination-Participation Restrictions Church;Cleaning;Shop;Community Activity;Driving;Yard Work;Laundry;Meal Prep;Occupation    Stability/Clinical Decision Making Stable/Uncomplicated    Clinical Decision Making Low    Rehab Potential Good    PT Frequency 2x / week    PT Duration 6 weeks    PT Treatment/Interventions ADLs/Self Care Home Management;Cryotherapy;Electrical Stimulation;Iontophoresis 4mg /ml Dexamethasone;Moist Heat;Traction;Balance training;Therapeutic exercise;Therapeutic activities;Functional mobility training;Stair training;Gait training;Ultrasound;Neuromuscular re-education;Patient/family education;Manual techniques;Vestibular;Taping;Energy conservation;Dry needling;Passive range of motion    PT Next Visit Plan reassess HEP, progress R hip and lumbar mobility, hip strengthening and stretching    Consulted and Agree with Plan of Care Patient           Patient will benefit from skilled therapeutic intervention in order to improve the following deficits and impairments:  Hypomobility, Decreased activity tolerance, Decreased  strength, Pain, Increased fascial restricitons, Decreased balance, Difficulty walking, Increased muscle spasms, Improper body mechanics, Decreased range of motion, Impaired flexibility, Postural dysfunction  Visit Diagnosis: Chronic midline low back pain without sciatica  Pain in right hip  Stiffness of right hip, not elsewhere classified  Other symptoms and signs involving the musculoskeletal system     Problem List Patient Active Problem List   Diagnosis Date Noted  . Other intervertebral disc degeneration, lumbar region 04/14/2020  . Obstructive sleep apnea 11/04/2018  . Headache 04/02/2015  . Chronic tension-type headache, intractable 01/26/2015  . Orthostatic hypotension 01/26/2015  . Vitamin D deficiency 12/30/2014  . Migraine 12/25/2014  . Dizziness 12/25/2014  . Tobacco abuse 12/25/2014  . Carpal tunnel syndrome 12/25/2014  . Preventative health care 12/25/2014  . Thyromegaly 11/16/2014  . BPV (benign positional vertigo) 11/16/2014  . Tension headache 11/16/2014  . IBS (irritable bowel syndrome) 11/16/2014     Janene Harvey, PT, DPT 04/22/20 10:33 AM   [facility] [address]  [place], [place], [address] Phone: [phone]   Fax:  [phone]  Name: Ainslee Sou MRN: [id number] Date of Birth: [birth year]

## 2020-04-22 NOTE — Telephone Encounter (Signed)
Pt called office today and spoke with Mitzo inquiring about completion of her FMLA paperwork.  I called patient back to let her know Lenna Sciara would not be in the office until 04/26/20 and we do have the paperwork here (based on previous telephone encounters) and Lenna Sciara will be able to see where she has called in regards to needing this paperwork completed ASAP.  Pt stated per her insurance company, anyone could complete the paperwork.  I advised the patient, per our office policy, her provider she routinely sees must complete the paperwork for her.  Pt expressed understanding.

## 2020-04-26 ENCOUNTER — Telehealth: Payer: Self-pay | Admitting: Family

## 2020-04-26 DIAGNOSIS — Z0279 Encounter for issue of other medical certificate: Secondary | ICD-10-CM

## 2020-04-26 NOTE — Telephone Encounter (Signed)
Form faxed to [facility] financial, confirmation received

## 2020-04-26 NOTE — Telephone Encounter (Signed)
Caller: Sheilyn Call Back # [phone]  Patient states she is calling to check status of her FMLA Paper work, patient states it is urgent that it is filled out ASAP. Pt states submitted form over two weeks ago and her 1 round of FMLA was denied due to being filled out in a trimly manner.

## 2020-04-26 NOTE — Telephone Encounter (Signed)
Patient advised provider is back in the office and is working on filling out all patient's forms. She will like form to be faxed when ready.

## 2020-04-26 NOTE — Telephone Encounter (Signed)
Form has been completed.

## 2020-04-26 NOTE — Telephone Encounter (Signed)
Paitent advised provider is back in the office and working on filling out Patient's forms. Her form will be faxed when ready at her request.

## 2020-04-26 NOTE — Telephone Encounter (Signed)
Pt calling again to check status

## 2020-04-27 ENCOUNTER — Other Ambulatory Visit: Payer: Self-pay

## 2020-04-27 ENCOUNTER — Ambulatory Visit: Payer: No Typology Code available for payment source | Admitting: Physical Therapy

## 2020-04-27 ENCOUNTER — Encounter: Payer: Self-pay | Admitting: Physical Therapy

## 2020-04-27 DIAGNOSIS — M545 Low back pain, unspecified: Secondary | ICD-10-CM

## 2020-04-27 DIAGNOSIS — M25651 Stiffness of right hip, not elsewhere classified: Secondary | ICD-10-CM

## 2020-04-27 DIAGNOSIS — M25551 Pain in right hip: Secondary | ICD-10-CM

## 2020-04-27 DIAGNOSIS — R29898 Other symptoms and signs involving the musculoskeletal system: Secondary | ICD-10-CM

## 2020-04-27 DIAGNOSIS — G8929 Other chronic pain: Secondary | ICD-10-CM

## 2020-04-27 NOTE — Therapy (Signed)
[facility] [address]  [place], [place], [address] Phone: [phone]   Fax:  [phone]  Physical Therapy Treatment  Patient Details  Name: Jaime Harris MRN: [id number] Date of Birth: 17-May-1956 Referring Provider (PT): Rodell Perna, MD   Encounter Date: 04/27/2020   PT End of Session - 04/27/20 1014    Visit Number 2    Number of Visits 13    Date for PT Re-Evaluation 06/03/20    Authorization Type [employer], VL: [id number]    Authorization - Visit Number 2    Authorization - Number of Visits 20    PT Start Time [phone]    PT Stop Time 1013    PT Time Calculation (min) 42 min    Activity Tolerance Patient tolerated treatment well    Behavior During Therapy [facility] for tasks assessed/performed           Past Medical History:  Diagnosis Date   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   TONSILLECTOMY  1975   TUBAL LIGATION      There were no vitals filed for this visit.   Subjective Assessment - 04/27/20 0933    Subjective Had a bad day Sunday- denies doing anything out of the ordinary. Reports compliance with HEP- notes pain with prone hip IR/ER.    Pertinent History vertigo, thyroid disease, HLD, HA, R internal carotid aneurysm 2020, B foot surgery    Diagnostic tests 04/14/20 lumbar xray: Disc base narrowing L2-3 through L4-5, Facet arthropathy is noted    Patient Stated Goals decrease pain    Currently in Pain? No/denies                             [facility] Adult PT Treatment/Exercise - 04/27/20 0001       Exercises   Exercises Knee/Hip;Lumbar      Lumbar Exercises: Aerobic   Nustep L3 x 6 min (UEs/LEs)      Lumbar Exercises: Seated   Other Seated Lumbar Exercises sitting pelvic tilts x10      Lumbar Exercises: Supine   Bridge with [employer] 10 reps    Bridge with [employer] Limitations limited ROM    Bridge with clamshell 10 reps   red TB isometric clam; limited ORM     Lumbar Exercises: Quadruped   Madcat/Old Horse 10 reps    Madcat/Old Horse Limitations slight incoordination      Knee/Hip Exercises: Stretches   Piriformis Stretch Right;Left;30 seconds;2 reps    Piriformis Stretch Limitations fig 4 with self-OP      Knee/Hip Exercises: Standing   Knee Flexion Strengthening;Right;Left;1 set;10 reps    Knee Flexion Limitations standing HS curl    Hip Flexion Stengthening;Right;Left;20 reps;Knee bent;2 sets    Hip Flexion Limitations resisted march with red loop   R trendelenberg evident- improved with cues     Knee/Hip Exercises: Sidelying   Clams each LE x10, with yellow  TB x10   good form, cues to increase ROM     Knee/Hip Exercises: Prone   Other Prone Exercises R/L hip IR/ER AROM x10 each side, with yellow TB x10 on R LE   manual cues to maintain spine neutral; cues for form                 PT Education - 04/27/20 1013    Education Details update to [employer]) Educated Patient    Methods Explanation;Tactile cues;Demonstration;Verbal cues;Handout    Comprehension Verbalized understanding;Returned demonstration            PT Short Term Goals - 04/27/20 1015      PT SHORT TERM GOAL #1   Title Patient to be independent with initial HEP.    Time 3    Period Weeks    Status On-going    Target Date 05/13/20             PT Long Term Goals - 04/27/20 1016      PT LONG TERM GOAL #1   Title Patient to be independent with advanced HEP.    Time 6    Period Weeks    Status On-going      PT LONG TERM GOAL #2   Title Patient to demonstrate  lumbar AROM WFL and without pain limiting.    Time 6    Period Weeks    Status On-going      PT LONG TERM GOAL #3   Title Patient to demonstrate R hip AROM WFL and without complaints at end of session.    Time 6    Period Weeks    Status On-going      PT LONG TERM GOAL #4   Title Patient to demonstrate B LE strength >/=4+/5.    Time 6    Period Weeks    Status On-going      PT LONG TERM GOAL #5   Title Patient to report a full day of work without pain.    Time 8    Period Weeks    Status On-going                 Plan - 04/27/20 1014    Clinical Impression Statement Patient reporting compliance with HEP and notes pain with prone hip IR/ER. Reviewed this exercise with patient requiring correction of proper form and movement into appropriate plane of movement. Patient reported no issues after this correction, and able to perform with addition of light resistance on R LE.  Limited ROM was demonstrated with bridging activities, however patient reporting no pain. Initiated clamshells with patient demonstrating overall good form and needing minor cueing to increase ROM. R Trendelenburg evident with resisted marching today, but improved with verbal cueing to try to maintain pelvic alignment. Reviewed and updated HEP for max benefit- patient reported understanding and without complaints at end of session.    Comorbidities vertigo, thyroid disease, HLD, HA, R internal carotid aneurysm 2020, B foot surgery    PT Treatment/Interventions ADLs/Self Care Home Management;Cryotherapy;Electrical Stimulation;Iontophoresis 4mg /ml Dexamethasone;Moist Heat;Traction;Balance training;Therapeutic exercise;Therapeutic activities;Functional mobility training;Stair training;Gait training;Ultrasound;Neuromuscular re-education;Patient/family education;Manual techniques;Vestibular;Taping;Energy conservation;Dry needling;Passive range of motion    PT Next Visit Plan progress R hip and lumbar mobility, hip  strengthening and stretching    Consulted and Agree with Plan of Care Patient           Patient will benefit from skilled therapeutic intervention in order to improve the following deficits and impairments:  Hypomobility, Decreased activity tolerance, Decreased strength, Pain, Increased fascial restricitons, Decreased balance, Difficulty walking, Increased muscle spasms, Improper body mechanics, Decreased range of motion, Impaired flexibility, Postural dysfunction  Visit Diagnosis: Chronic midline low back pain without sciatica  Pain in right hip  Stiffness of right hip, not elsewhere classified  Other symptoms and signs involving the musculoskeletal system     Problem List Patient Active Problem List   Diagnosis Date Noted   Other intervertebral disc degeneration, lumbar region 04/14/2020   Obstructive sleep apnea 11/04/2018   Headache 04/02/2015   Chronic tension-type headache, intractable 01/26/2015   Orthostatic hypotension 01/26/2015   Vitamin D deficiency 12/30/2014   Migraine 12/25/2014   Dizziness 12/25/2014   Tobacco abuse 12/25/2014   Carpal tunnel syndrome 12/25/2014   Preventative health care 12/25/2014   Thyromegaly 11/16/2014   BPV (benign positional vertigo) 11/16/2014   Tension headache 11/16/2014   IBS (irritable bowel syndrome) 11/16/2014    Janene Harvey, PT, DPT 04/27/20 10:20 AM   [facility] [address]  [place], [place], [address] Phone: [phone]   Fax:  [phone]  Name: Kadisha Goodine MRN: [id number] Date of Birth: 1955-12-21

## 2020-04-28 ENCOUNTER — Telehealth: Payer: Self-pay | Admitting: Family

## 2020-04-28 ENCOUNTER — Ambulatory Visit (INDEPENDENT_AMBULATORY_CARE_PROVIDER_SITE_OTHER): Payer: No Typology Code available for payment source | Admitting: Family

## 2020-04-28 ENCOUNTER — Ambulatory Visit (HOSPITAL_BASED_OUTPATIENT_CLINIC_OR_DEPARTMENT_OTHER)
Admission: RE | Admit: 2020-04-28 | Discharge: 2020-04-28 | Disposition: A | Discharge status: Home / Self Care | Payer: No Typology Code available for payment source | Source: Ambulatory Visit | Attending: Family | Admitting: Family

## 2020-04-28 ENCOUNTER — Encounter: Payer: Self-pay | Admitting: Family

## 2020-04-28 VITALS — BP 134/62 | HR 91 | Temp 98.6°F | Resp 16 | Ht 63.0 in | Wt 183.4 lb

## 2020-04-28 DIAGNOSIS — R05 Cough: Secondary | ICD-10-CM | POA: Insufficient documentation

## 2020-04-28 DIAGNOSIS — Z720 Tobacco use: Secondary | ICD-10-CM

## 2020-04-28 DIAGNOSIS — R059 Cough, unspecified: Secondary | ICD-10-CM

## 2020-04-28 DIAGNOSIS — H6691 Otitis media, unspecified, right ear: Secondary | ICD-10-CM

## 2020-04-28 MED ORDER — MELOXICAM 7.5 MG PO TABS
7.5000 mg | ORAL_TABLET | Freq: Every day | ORAL | 3 refills | Status: DC | PRN
Start: 2020-04-28 — End: 2020-10-12

## 2020-04-28 MED ORDER — CHANTIX STARTING MONTH PAK 0.5 MG X 11 & 1 MG X 42 PO TABS: ORAL_TABLET | ORAL | 0 refills | Status: DC

## 2020-04-28 MED FILL — MELOXICAM 7.5 MG TABLET: 7.5 | 30 days supply | Qty: 30 | Fill #0

## 2020-04-28 NOTE — Telephone Encounter (Signed)
Per patient , pharmacy is out of this medication. Manufacture problem.  varenicline (CHANTIX STARTING MONTH PAK) 0.5 MG X 11 & 1 MG X 42 tablet   Please advise

## 2020-04-28 NOTE — Progress Notes (Signed)
Subjective:    Patient ID: Jaime Harris, female    DOB: Oct 25, 1955, 64 y.o.   MRN: [id number]  HPI   Patient is a 64 yr old female who presents today for follow up of R Otitis media. She saw Jaime Core PA-c on 04/16/20. She was treated with augmentin.  Pt reports worsening cough. Some wheezing.  Denies gerd.  She attributes cough to her long history of smoking.   Review of Systems See HPI  Past Medical History:  Diagnosis Date   Aneurysm ([place]) 2020   supraclinoid R int carotid artery   Chronic tension headaches    Headache    Hyperlipidemia    borderline   Sickle cell trait ([facility])    Thyroid disease    enlarged thyroid   Vertigo    Vitamin D deficiency      Social History   Socioeconomic History   Marital status: Single    Spouse name: Not on file   Number of children: 3   Years of education: Not on file   Highest education level: High school graduate  Occupational History    Comment: Cashier  Tobacco Use   Smoking status: Current Every Day Smoker    Packs/day: 0.75    Types: Cigarettes   Smokeless tobacco: Never Used  Substance and Sexual Activity   Alcohol use: Yes    Comment: occasional   Drug use: No   Sexual activity: Not on file  Other Topics Concern   Not on file  Social History Narrative   Makes eye glasses.     Single   GED   She has 3 children   75- Son Jaime Harris- lives locally- he has one son   57- Daughter Jaime Harris- 7 children (4 of these children live with the pt- she is a primary care giver)   Horine- lives in [place] 1 daughter   Enjoys reading   Caffeine- coffee, 4 cups daily, soda every other day   Social Determinants of Health   Financial Resource Strain:    Difficulty of Paying Living Expenses:   Food Insecurity:    Worried About Charity fundraiser in the Last Year:    Arboriculturist in the Last Year:   Transportation Needs:    Film/video editor (Medical):    Lack of Transportation  (Non-Medical):   Physical Activity:    Days of Exercise per Week:    Minutes of Exercise per Session:   Stress:    Feeling of Stress :   Social Connections:    Frequency of Communication with Friends and Family:    Frequency of Social Gatherings with Friends and Family:    Attends Religious Services:    Active Member of Clubs or Organizations:    Attends Music therapist:    Marital Status:   Intimate Partner Violence:    Fear of Current or Ex-Partner:    Emotionally Abused:    Physically Abused:    Sexually Abused:     Past Surgical History:  Procedure Laterality Date   FOOT SURGERY Bilateral    Pt states she had bones removed from the sides of her feet and middle toes so she could walk better.   TONSILLECTOMY  1975   TUBAL LIGATION      Family History  Problem Relation Age of Onset   Heart disease Mother        pacemaker/defibrillator   Diabetes Mother    Heart disease Paternal  Grandmother        died of CHF   Diabetes Paternal Grandmother    Diabetes Brother    Cancer Neg Hx    Kidney disease Neg Hx    Colon cancer Neg Hx    Colon polyps Neg Hx    Rectal cancer Neg Hx    Stomach cancer Neg Hx     No Known Allergies  Current Outpatient Medications on File Prior to Visit  Medication Sig Dispense Refill   ibuprofen (ADVIL) 800 MG tablet Take 800 mg by mouth every 6 (six) hours as needed.     meloxicam (MOBIC) 15 MG tablet Take 1 tablet daily as needed for pain. 15 tablet 0   nystatin (MYCOSTATIN/NYSTOP) powder Apply 1 application topically 2 (two) times daily. 30 g 2   varenicline (CHANTIX STARTING MONTH PAK) 0.5 MG X 11 & 1 MG X 42 tablet one 0.5 mg tab by mouth 1 x  daily for 3 days, then increase to one 0.5 mg tablet 2 x daily for 4 days, then increase to one 1 mg tab bid 53 tablet 0   [DISCONTINUED] gabapentin (NEURONTIN) 100 MG capsule Take 1 capsule (100 mg total) by mouth 3 (three) times daily. 60 capsule 0    No current facility-administered medications on file prior to visit.    BP 134/62 (BP Location: Right Arm, Patient Position: Sitting, Cuff Size: Small)    Pulse 91    Temp 98.6 F (37 C) (Oral)    Resp 16    Ht 5\' 3"  (1.6 m)    Wt 183 lb 6.4 oz (83.2 kg)    SpO2 99%    BMI 32.49 kg/m       Objective:   Physical Exam Constitutional:      Appearance: She is well-developed.  HENT:     Right Ear: Tympanic membrane and ear canal normal.     Left Ear: Tympanic membrane and ear canal normal.  Neck:     Thyroid: No thyromegaly.  Cardiovascular:     Rate and Rhythm: Normal rate and regular rhythm.     Heart sounds: Normal heart sounds. No murmur heard.   Pulmonary:     Effort: Pulmonary effort is normal. No respiratory distress.     Breath sounds: Normal breath sounds. No wheezing.  Musculoskeletal:     Cervical back: Neck supple.  Skin:    General: Skin is warm and dry.  Neurological:     Mental Status: She is alert and oriented to person, place, and time.  Psychiatric:        Behavior: Behavior normal.        Thought Content: Thought content normal.        Judgment: Judgment normal.           Assessment & Plan:  Cough- will obtain CXR.  She is not on an ACE and is not having any gerd symptoms.    Tobacco abuse- discussed smoking cessation- she wants to start chantix but there is a manufacturer's back order.  R Otitis media- resolved.   This visit occurred during the SARS-CoV-2 public health emergency.  Safety protocols were in place, including screening questions prior to the visit, additional usage of staff PPE, and extensive cleaning of exam room while observing appropriate contact time as indicated for disinfecting solutions.

## 2020-04-29 ENCOUNTER — Encounter: Payer: Self-pay | Admitting: Physical Therapy

## 2020-04-29 ENCOUNTER — Ambulatory Visit: Payer: No Typology Code available for payment source | Admitting: Physical Therapy

## 2020-04-29 ENCOUNTER — Other Ambulatory Visit: Payer: Self-pay

## 2020-04-29 DIAGNOSIS — M545 Low back pain: Secondary | ICD-10-CM | POA: Diagnosis not present

## 2020-04-29 DIAGNOSIS — G8929 Other chronic pain: Secondary | ICD-10-CM

## 2020-04-29 DIAGNOSIS — M25651 Stiffness of right hip, not elsewhere classified: Secondary | ICD-10-CM

## 2020-04-29 DIAGNOSIS — R29898 Other symptoms and signs involving the musculoskeletal system: Secondary | ICD-10-CM

## 2020-04-29 DIAGNOSIS — M25551 Pain in right hip: Secondary | ICD-10-CM

## 2020-04-29 MED ORDER — OMEPRAZOLE 40 MG PO CPDR
40.0000 mg | DELAYED_RELEASE_CAPSULE | Freq: Every day | ORAL | 3 refills | Status: DC
Start: 2020-04-29 — End: 2020-06-14

## 2020-04-29 NOTE — Telephone Encounter (Signed)
Patient advised of medication recall, chest x-ray results and adding Omeprezole. She verbalized understanding. Was scheduled to follow up 06-14-2020

## 2020-04-29 NOTE — Therapy (Signed)
[facility] [address]  [place], [place], [address] Phone: [phone]   Fax:  [phone]  Physical Therapy Treatment  Patient Details  Name: Jaime Harris MRN: [id number] Date of Birth: [birth year] Referring Provider (PT): Rodell Perna, MD   Encounter Date: 04/29/2020   PT End of Session - 04/29/20 1058    Visit Number 3    Number of Visits 13    Date for PT Re-Evaluation 06/03/20    Authorization Type Aetna, VL: 25    Authorization - Visit Number 3    Authorization - Number of Visits 20    PT Start Time 1013    PT Stop Time 1056    PT Time Calculation (min) 43 min    Activity Tolerance Patient tolerated treatment well    Behavior During Therapy [facility] for tasks assessed/performed           Past Medical History:  Diagnosis Date   Aneurysm ([place]) 2020   supraclinoid R int carotid artery   Chronic tension headaches    Headache    Hyperlipidemia    borderline   Sickle cell trait ([place])    Thyroid disease    enlarged thyroid   Vertigo    Vitamin D deficiency     Past Surgical History:  Procedure Laterality Date   FOOT SURGERY Bilateral    Pt states she had bones removed from the sides of her feet and middle toes so she could walk better.   TONSILLECTOMY  1975   TUBAL LIGATION      There were no vitals filed for this visit.   Subjective Assessment - 04/29/20 1015    Subjective Doing okay. HEP is a little "rough" because she isn't used to doing exercises. Just got off work.    Pertinent History vertigo, thyroid disease, HLD, HA, R internal carotid aneurysm 2020, B foot surgery    Diagnostic tests 04/14/20 lumbar xray: Disc base narrowing L2-3 through L4-5, Facet arthropathy is noted    Patient Stated Goals decrease pain    Currently in Pain? No/denies              [facility] PT Assessment - 04/29/20 0001      Flexibility   Soft Tissue Assessment /Muscle Length yes    ITB R Ober's test  positive                         [facility] Adult PT Treatment/Exercise - 04/29/20 0001      Self-Care   Self-Care Other Self-Care Comments    Other Self-Care Comments  edu on self-STM to R buttock for pain relief      Lumbar Exercises: Aerobic   Nustep L4 x 6 min (UEs/LEs)      Lumbar Exercises: Seated   Other Seated Lumbar Exercises sitting pelvic tilts x10   cueing for correct movement pattern     Lumbar Exercises: Quadruped   Other Quadruped Lumbar Exercises thread the needle 5x each side   limited ROM on R   Other Quadruped Lumbar Exercises R/L fire hydrant x10 each   manual cueing to prevent hip rotation and prevent hip IR     Knee/Hip Exercises: Stretches   ITB Stretch Right;2 reps;30 seconds    ITB Stretch Limitations supine with strap    Other Knee/Hip Stretches R TFL stretch in sidelying with leg extended 2x30"      Knee/Hip Exercises: Seated   Other  Seated Knee/Hip Exercises B hip IR with ball and yellow TB x10      Manual Therapy   Manual Therapy Soft tissue mobilization;Myofascial release    Manual therapy comments prone    Soft tissue mobilization STM to R piriformis, proximal and lateral glutes, TFL    Myofascial Release manual TPR to R piriformis, proximal and lateral glutes, TFL   twitch response over TFL, slightly TTP with pressure                 PT Education - 04/29/20 1058    Education Details update to HEP    Person(s) Educated Patient    Methods Explanation;Demonstration;Tactile cues;Verbal cues;Handout    Comprehension Verbalized understanding;Returned demonstration            PT Short Term Goals - 04/27/20 1015      PT SHORT TERM GOAL #1   Title Patient to be independent with initial HEP.    Time 3    Period Weeks    Status On-going    Target Date 05/13/20             PT Long Term Goals - 04/27/20 1016      PT LONG TERM GOAL #1   Title Patient to be independent with advanced HEP.    Time 6    Period Weeks     Status On-going      PT LONG TERM GOAL #2   Title Patient to demonstrate lumbar AROM WFL and without pain limiting.    Time 6    Period Weeks    Status On-going      PT LONG TERM GOAL #3   Title Patient to demonstrate R hip AROM WFL and without complaints at end of session.    Time 6    Period Weeks    Status On-going      PT LONG TERM GOAL #4   Title Patient to demonstrate B LE strength >/=4+/5.    Time 6    Period Weeks    Status On-going      PT LONG TERM GOAL #5   Title Patient to report a full day of work without pain.    Time 8    Period Weeks    Status On-going                 Plan - 04/29/20 1058    Clinical Impression Statement Patient reporting some muscle soreness after performing HEP. Continued to work on hip strengthening ther-ex, with patient demonstrating fairly symmetrical ROM with resisted IR. Patient still initially demonstrating some difficulty performing pelvic tilts, requiring cueing for correct movement pattern. Quadruped core strengthening was performed with increased difficulty R>L. Patient tolerated STM and manual TPR to R proximal and lateral glutes, piriformis, and TFL. Patient also with positive R Obers test, thus followed with stretching. Updated TFL stretch into HEP- patient reported understanding. No complaints at end of session.    Comorbidities vertigo, thyroid disease, HLD, HA, R internal carotid aneurysm 2020, B foot surgery    PT Treatment/Interventions ADLs/Self Care Home Management;Cryotherapy;Electrical Stimulation;Iontophoresis 4mg /ml Dexamethasone;Moist Heat;Traction;Balance training;Therapeutic exercise;Therapeutic activities;Functional mobility training;Stair training;Gait training;Ultrasound;Neuromuscular re-education;Patient/family education;Manual techniques;Vestibular;Taping;Energy conservation;Dry needling;Passive range of motion    PT Next Visit Plan progress R hip and lumbar mobility, hip strengthening and stretching     Consulted and Agree with Plan of Care Patient           Patient will benefit from skilled therapeutic intervention in order to improve the  following deficits and impairments:  Hypomobility, Decreased activity tolerance, Decreased strength, Pain, Increased fascial restricitons, Decreased balance, Difficulty walking, Increased muscle spasms, Improper body mechanics, Decreased range of motion, Impaired flexibility, Postural dysfunction  Visit Diagnosis: Chronic midline low back pain without sciatica  Pain in right hip  Stiffness of right hip, not elsewhere classified  Other symptoms and signs involving the musculoskeletal system     Problem List Patient Active Problem List   Diagnosis Date Noted   Other intervertebral disc degeneration, lumbar region 04/14/2020   Obstructive sleep apnea 11/04/2018   Headache 04/02/2015   Chronic tension-type headache, intractable 01/26/2015   Orthostatic hypotension 01/26/2015   Vitamin D deficiency 12/30/2014   Migraine 12/25/2014   Dizziness 12/25/2014   Tobacco abuse 12/25/2014   Carpal tunnel syndrome 12/25/2014   Preventative health care 12/25/2014   Thyromegaly 11/16/2014   BPV (benign positional vertigo) 11/16/2014   Tension headache 11/16/2014   IBS (irritable bowel syndrome) 11/16/2014     Janene Harvey, PT, DPT 04/29/20 11:52 AM   [facility] [address]  [place], [place], [address] Phone: [phone]   Fax:  [phone]  Name: Khadeejah Castner MRN: [id number] Date of Birth: May 20, 1956

## 2020-04-29 NOTE — Telephone Encounter (Signed)
Please advise pt that chantix is on manufacturers back order.   Also, Chest xray is clear. She can try adding omeprazole once daily to see if this helps with her reflux.  Please follow up in 6 weeks.

## 2020-05-04 ENCOUNTER — Encounter: Payer: Self-pay | Admitting: Physical Therapy

## 2020-05-04 ENCOUNTER — Ambulatory Visit: Payer: No Typology Code available for payment source | Admitting: Physical Therapy

## 2020-05-04 ENCOUNTER — Other Ambulatory Visit: Payer: Self-pay

## 2020-05-04 DIAGNOSIS — M25651 Stiffness of right hip, not elsewhere classified: Secondary | ICD-10-CM

## 2020-05-04 DIAGNOSIS — G8929 Other chronic pain: Secondary | ICD-10-CM

## 2020-05-04 DIAGNOSIS — M25551 Pain in right hip: Secondary | ICD-10-CM

## 2020-05-04 DIAGNOSIS — M545 Low back pain: Secondary | ICD-10-CM | POA: Diagnosis not present

## 2020-05-04 DIAGNOSIS — R29898 Other symptoms and signs involving the musculoskeletal system: Secondary | ICD-10-CM

## 2020-05-04 NOTE — Therapy (Signed)
[facility] [address]  [place], [place], [address] Phone: [phone]   Fax:  [phone]  Physical Therapy Treatment  Patient Details  Name: Jaime Harris MRN: [id number] Date of Birth: 04-30-1956 Referring Provider (PT): Rodell Perna, MD   Encounter Date: 05/04/2020   PT End of Session - 05/04/20 1608    Visit Number 4    Number of Visits 13    Date for PT Re-Evaluation 06/03/20    Authorization Type A[employer], VL: [id number]    Authorization - Visit Number 4    Authorization - Number of Visits 20    PT Start Time 8502    PT Stop Time 1615    PT Time Calculation (min) 45 min    Activity Tolerance Patient tolerated treatment well;Patient limited by pain    Behavior During Therapy [facility] for tasks assessed/performed           Past Medical History:  Diagnosis Date  . Aneurysm ([place]) 2020   supraclinoid R int carotid artery  . Chronic tension headaches   . Headache   . Hyperlipidemia    borderline  . Sickle cell trait ([place])   . Thyroid disease    enlarged thyroid  . Vertigo   . Vitamin D deficiency     Past Surgical History:  Procedure Laterality Date  . FOOT SURGERY Bilateral    Pt states she had bones removed from the sides of her feet and middle toes so she could walk better.  . TONSILLECTOMY  1975  . TUBAL LIGATION      There were no vitals filed for this visit.   Subjective Assessment - 05/04/20 1532    Subjective Had a back night last night at work d/t LB and R hip pain without known cause.    Pertinent History vertigo, thyroid disease, HLD, HA, R internal carotid aneurysm 2020, B foot surgery    Diagnostic tests 04/14/20 lumbar xray: Disc base narrowing L2-3 through L4-5, Facet arthropathy is noted    Patient Stated Goals decrease pain    Currently in Pain? Yes    Pain Score 6     Pain Location Back    Pain Orientation Lower;Left    Pain Descriptors / Indicators Throbbing   "just there"   Pain Type  Chronic pain;Acute pain    Multiple Pain Sites Yes    Pain Score 6    Pain Location Hip    Pain Orientation Right    Pain Descriptors / Indicators --   "just there"   Pain Type Acute pain;Chronic pain                             [facility] Adult PT Treatment/Exercise - 05/04/20 0001      Lumbar Exercises: Aerobic   Nustep L4 x 6 min (UEs/LEs)      Lumbar Exercises: Seated   Other Seated Lumbar Exercises prayer stretch with green pball 5x5" forward, to R, to L     Other Seated Lumbar Exercises sitting pelvic tilts x10      Lumbar Exercises: Sidelying   Other Sidelying Lumbar Exercises R/L open book stretch 10x each side to tolerance   mild limitation in ROM     Knee/Hip Exercises: Standing   Hip Abduction Stengthening;Right;Left;1 set;10 reps;Knee straight    Abduction Limitations at TM rail   slight lateral lean with R ABD   Hip Extension Stengthening;Right;Left;1 set;10  reps    Extension Limitations at TM rail   good upright posture     Knee/Hip Exercises: Sidelying   Hip ABduction Strengthening;Right;Left;1 set;10 reps    Hip ABduction Limitations unable d/t hip pain    Hip ADduction Strengthening;Right;Left;1 set;10 reps    Hip ADduction Limitations opposite LE resting on bolster   cues for TKE     Modalities   Modalities Electrical Stimulation;Moist Heat      Moist Heat Therapy   Number Minutes Moist Heat 10 Minutes    Moist Heat Location Lumbar Spine   L     Electrical Stimulation   Electrical Stimulation Location L LB    Electrical Stimulation Action IFC    Electrical Stimulation Parameters 80-150hz ; output to tolerance; 10 min    Electrical Stimulation Goals Tone;Pain                    PT Short Term Goals - 04/27/20 1015      PT SHORT TERM GOAL #1   Title Patient to be independent with initial HEP.    Time 3    Period Weeks    Status On-going    Target Date 05/13/20             PT Long Term Goals - 04/27/20 1016      PT  LONG TERM GOAL #1   Title Patient to be independent with advanced HEP.    Time 6    Period Weeks    Status On-going      PT LONG TERM GOAL #2   Title Patient to demonstrate lumbar AROM WFL and without pain limiting.    Time 6    Period Weeks    Status On-going      PT LONG TERM GOAL #3   Title Patient to demonstrate R hip AROM WFL and without complaints at end of session.    Time 6    Period Weeks    Status On-going      PT LONG TERM GOAL #4   Title Patient to demonstrate B LE strength >/=4+/5.    Time 6    Period Weeks    Status On-going      PT LONG TERM GOAL #5   Title Patient to report a full day of work without pain.    Time 8    Period Weeks    Status On-going                 Plan - 05/04/20 1609    Clinical Impression Statement Patient reporting increased LB and R hip pain last night while working 3rd shift. Worked on gentle lumbopelvic mobility to address ROM and pain. Patient tolerated these exercises without complaints. Did demonstrate slightly decreased ROM with open book stretching to B sides. Initiated sidelying hip strengthening with patient demonstrating ability to perform sidelying hip adduction but limited by L hip/LBP. Thus, performed hip abduction strengthening in standing with better tolerance. Ended session with moist heat and e-stim to the L LB for pain relief. Patient reported improvement in pain levels at end of session.    Comorbidities vertigo, thyroid disease, HLD, HA, R internal carotid aneurysm 2020, B foot surgery    PT Treatment/Interventions ADLs/Self Care Home Management;Cryotherapy;Electrical Stimulation;Iontophoresis 4mg /ml Dexamethasone;Moist Heat;Traction;Balance training;Therapeutic exercise;Therapeutic activities;Functional mobility training;Stair training;Gait training;Ultrasound;Neuromuscular re-education;Patient/family education;Manual techniques;Vestibular;Taping;Energy conservation;Dry needling;Passive range of motion    PT Next  Visit Plan progress R hip and lumbar mobility, hip strengthening and stretching  Consulted and Agree with Plan of Care Patient           Patient will benefit from skilled therapeutic intervention in order to improve the following deficits and impairments:  Hypomobility, Decreased activity tolerance, Decreased strength, Pain, Increased fascial restricitons, Decreased balance, Difficulty walking, Increased muscle spasms, Improper body mechanics, Decreased range of motion, Impaired flexibility, Postural dysfunction  Visit Diagnosis: Chronic midline low back pain without sciatica  Pain in right hip  Stiffness of right hip, not elsewhere classified  Other symptoms and signs involving the musculoskeletal system     Problem List Patient Active Problem List   Diagnosis Date Noted  . Other intervertebral disc degeneration, lumbar region 04/14/2020  . Obstructive sleep apnea 11/04/2018  . Headache 04/02/2015  . Chronic tension-type headache, intractable 01/26/2015  . Orthostatic hypotension 01/26/2015  . Vitamin D deficiency 12/30/2014  . Migraine 12/25/2014  . Dizziness 12/25/2014  . Tobacco abuse 12/25/2014  . Carpal tunnel syndrome 12/25/2014  . Preventative health care 12/25/2014  . Thyromegaly 11/16/2014  . BPV (benign positional vertigo) 11/16/2014  . Tension headache 11/16/2014  . IBS (irritable bowel syndrome) 11/16/2014     Janene Harvey, PT, DPT 05/04/20 5:00 PM   [facility] [address]  [place], [place], [address] Phone: [phone]   Fax:  [phone]  Name: Jaime Harris MRN: [id number] Date of Birth: 18-Dec-1955

## 2020-05-07 ENCOUNTER — Other Ambulatory Visit: Payer: Self-pay

## 2020-05-07 ENCOUNTER — Ambulatory Visit
Admission: RE | Admit: 2020-05-07 | Discharge: 2020-05-07 | Disposition: A | Discharge status: Home / Self Care | Payer: No Typology Code available for payment source | Source: Ambulatory Visit | Attending: Family | Admitting: Family

## 2020-05-07 DIAGNOSIS — Z0000 Encounter for general adult medical examination without abnormal findings: Secondary | ICD-10-CM

## 2020-05-11 ENCOUNTER — Ambulatory Visit: Payer: No Typology Code available for payment source

## 2020-05-13 ENCOUNTER — Ambulatory Visit: Payer: No Typology Code available for payment source | Attending: Orthopaedic Surgery

## 2020-05-13 ENCOUNTER — Other Ambulatory Visit: Payer: Self-pay

## 2020-05-13 DIAGNOSIS — G8929 Other chronic pain: Secondary | ICD-10-CM | POA: Insufficient documentation

## 2020-05-13 DIAGNOSIS — M25651 Stiffness of right hip, not elsewhere classified: Secondary | ICD-10-CM | POA: Diagnosis present

## 2020-05-13 DIAGNOSIS — M545 Low back pain: Secondary | ICD-10-CM | POA: Insufficient documentation

## 2020-05-13 DIAGNOSIS — M25551 Pain in right hip: Secondary | ICD-10-CM | POA: Insufficient documentation

## 2020-05-13 DIAGNOSIS — R29898 Other symptoms and signs involving the musculoskeletal system: Secondary | ICD-10-CM | POA: Diagnosis present

## 2020-05-13 NOTE — Therapy (Addendum)
[facility] [address]  [place], [place], [address] Phone: [phone]   Fax:  [phone]  Physical Therapy Treatment  Patient Details  Name: Jaime Harris MRN: [id number] Date of Birth: Aug 17, 1956 Referring Provider (PT): Rodell Perna, MD   Encounter Date: 05/13/2020   PT End of Session - 05/13/20 1108    Visit Number 5    Number of Visits 13    Date for PT Re-Evaluation 06/03/20    Authorization Type [employer]et[employer], VL: 51    Authorization - Visit Number 5    Authorization - Number of Visits 20    PT Start Time 1101    PT Stop Time 1146    PT Time Calculation (min) 45 min    Activity Tolerance Patient tolerated treatment well;Patient limited by pain    Behavior During Therapy [facility] for tasks assessed/performed           Past Medical History:  Diagnosis Date  . Aneurysm ([place]) 2020   supraclinoid R int carotid artery  . Chronic tension headaches   . Headache   . Hyperlipidemia    borderline  . Sickle cell trait ([place])   . Thyroid disease    enlarged thyroid  . Vertigo   . Vitamin D deficiency     Past Surgical History:  Procedure Laterality Date  . FOOT SURGERY Bilateral    Pt states she had bones removed from the sides of her feet and middle toes so she could walk better.  . TONSILLECTOMY  1975  . TUBAL LIGATION      There were no vitals filed for this visit.   Subjective Assessment - 05/13/20 1103    Subjective Pt. reporting she has had improved pain today, "it's just mild".    Pertinent History vertigo, thyroid disease, HLD, HA, R internal carotid aneurysm 2020, B foot surgery    Diagnostic tests 04/14/20 lumbar xray: Disc base narrowing L2-3 through L4-5, Facet arthropathy is noted    Patient Stated Goals decrease pain    Currently in Pain? Yes    Pain Score 3     Pain Location Back    Pain Orientation Lower;Left    Pain Descriptors / Indicators --   " not throbbing, just there"   Pain Type  Chronic pain;Acute pain                             [facility] Adult PT Treatment/Exercise - 05/13/20 0001      Lumbar Exercises: Aerobic   Nustep L4 x 6 min (UEs/LEs)      Lumbar Exercises: Sidelying   Hip Abduction Limitations cues for proper positioning     Other Sidelying Lumbar Exercises R/L open book stretch 5x each side to tolerance      Lumbar Exercises: Quadruped   [place] 10 reps    Mad[place]/Old Horse Limitations improved technique with cueing       Knee/Hip Exercises: Stretches   ITB Stretch Right;Left;1 rep;30 seconds    ITB Stretch Limitations supine with strap       Knee/Hip Exercises: Standing   Other Standing Knee Exercises Side stepping with red looped TB at forefoot 2 x 25 ft       Knee/Hip Exercises: Supine   Bridges Both;2 sets;15 reps    Bridges Limitations 3" hold       Knee/Hip Exercises: Sidelying   Hip ABduction Strengthening;Right;Left;1 set;10 reps  Hip ABduction Limitations well tolerated                   PT Education - 05/13/20 1327    Education Details HEP update    Person(s) Educated Patient    Methods Explanation;Demonstration;Verbal cues;Handout    Comprehension Verbalized understanding;Returned demonstration;Verbal cues required            PT Short Term Goals - 05/13/20 1200      PT SHORT TERM GOAL #1   Title Patient to be independent with initial HEP.    Time 3    Period Weeks    Status Achieved    Target Date 05/13/20             PT Long Term Goals - 04/27/20 1016      PT LONG TERM GOAL #1   Title Patient to be independent with advanced HEP.    Time 6    Period Weeks    Status On-going      PT LONG TERM GOAL #2   Title Patient to demonstrate lumbar AROM WFL and without pain limiting.    Time 6    Period Weeks    Status On-going      PT LONG TERM GOAL #3   Title Patient to demonstrate R hip AROM WFL and without complaints at end of session.    Time 6    Period Weeks    Status  On-going      PT LONG TERM GOAL #4   Title Patient to demonstrate B LE strength >/=4+/5.    Time 6    Period Weeks    Status On-going      PT LONG TERM GOAL #5   Title Patient to report a full day of work without pain.    Time 8    Period Weeks    Status On-going                 Plan - 05/13/20 1117    Clinical Impression Statement Jaime Harris reports limited LBP reduction since starting therapy however does note she has been having a "mild" pain day today.  Progressed lumbopelvic rotation ROM activities along with flexion/extension and good tolerance thus updated HEP accordingly.  Pt. confirming lateral hip fatigue after proximal hip strengthening activities today and with visible lateral hip weakness.  Will continue to benefit from further skilled therapy to improve functional strength and mobility for pain reduction and greater ease of work tasks.    Comorbidities vertigo, thyroid disease, HLD, HA, R internal carotid aneurysm 2020, B foot surgery    Rehab Potential Good    PT Frequency 2x / week    PT Duration 6 weeks    PT Treatment/Interventions ADLs/Self Care Home Management;Cryotherapy;Electrical Stimulation;Iontophoresis 4mg /ml Dexamethasone;Moist Heat;Traction;Balance training;Therapeutic exercise;Therapeutic activities;Functional mobility training;Stair training;Gait training;Ultrasound;Neuromuscular re-education;Patient/family education;Manual techniques;Vestibular;Taping;Energy conservation;Dry needling;Passive range of motion    PT Next Visit Plan progress R hip and lumbar mobility, hip strengthening and stretching    Consulted and Agree with Plan of Care Patient           Patient will benefit from skilled therapeutic intervention in order to improve the following deficits and impairments:  Hypomobility, Decreased activity tolerance, Decreased strength, Pain, Increased fascial restricitons, Decreased balance, Difficulty walking, Increased muscle spasms, Improper body  mechanics, Decreased range of motion, Impaired flexibility, Postural dysfunction  Visit Diagnosis: Chronic midline low back pain without sciatica  Pain in right hip  Stiffness of right hip, not elsewhere classified  Other symptoms and signs involving the musculoskeletal system     Problem List Patient Active Problem List   Diagnosis Date Noted  . Other intervertebral disc degeneration, lumbar region 04/14/2020  . Obstructive sleep apnea 11/04/2018  . Headache 04/02/2015  . Chronic tension-type headache, intractable 01/26/2015  . Orthostatic hypotension 01/26/2015  . Vitamin D deficiency 12/30/2014  . Migraine 12/25/2014  . Dizziness 12/25/2014  . Tobacco abuse 12/25/2014  . Carpal tunnel syndrome 12/25/2014  . Preventative health care 12/25/2014  . Thyromegaly 11/16/2014  . BPV (benign positional vertigo) 11/16/2014  . Tension headache 11/16/2014  . IBS (irritable bowel syndrome) 11/16/2014    Bess Harvest, PTA 05/13/20 1:28 PM   [facility] [address]  [place], [place], [address] Phone: [phone]   Fax:  [phone]  Name: Jaime Harris MRN: [id number] Date of Birth: 18-Sep-1955

## 2020-05-18 ENCOUNTER — Ambulatory Visit: Payer: No Typology Code available for payment source | Admitting: Physical Therapy

## 2020-05-20 ENCOUNTER — Ambulatory Visit: Payer: No Typology Code available for payment source

## 2020-05-20 ENCOUNTER — Other Ambulatory Visit: Payer: Self-pay

## 2020-05-20 DIAGNOSIS — M25551 Pain in right hip: Secondary | ICD-10-CM

## 2020-05-20 DIAGNOSIS — M25651 Stiffness of right hip, not elsewhere classified: Secondary | ICD-10-CM

## 2020-05-20 DIAGNOSIS — M545 Low back pain: Secondary | ICD-10-CM | POA: Diagnosis not present

## 2020-05-20 DIAGNOSIS — R29898 Other symptoms and signs involving the musculoskeletal system: Secondary | ICD-10-CM

## 2020-05-20 DIAGNOSIS — G8929 Other chronic pain: Secondary | ICD-10-CM

## 2020-05-20 NOTE — Therapy (Signed)
[facility] [address]  [place], [place], [address] Phone: [phone]   Fax:  [phone]  Physical Therapy Treatment  Patient Details  Name: Jaime Harris MRN: [id number] Date of Birth: 1956/04/20 Referring Provider (PT): Rodell Perna, MD   Encounter Date: 05/20/2020   PT End of Session - 05/20/20 1031    Visit Number 6    Number of Visits 13    Date for PT Re-Evaluation 06/03/20    Authorization Type Aetna, VL: 35    Authorization - Visit Number 6    Authorization - Number of Visits 20    PT Start Time 1016    PT Stop Time 1109    PT Time Calculation (min) 53 min    Activity Tolerance Patient tolerated treatment well;Patient limited by pain    Behavior During Therapy [facility] for tasks assessed/performed           Past Medical History:  Diagnosis Date  . Aneurysm ([place]) 2020   supraclinoid R int carotid artery  . Chronic tension headaches   . Headache   . Hyperlipidemia    borderline  . Sickle cell trait ([place])   . Thyroid disease    enlarged thyroid  . Vertigo   . Vitamin D deficiency     Past Surgical History:  Procedure Laterality Date  . FOOT SURGERY Bilateral    Pt states she had bones removed from the sides of her feet and middle toes so she could walk better.  . TONSILLECTOMY  1975  . TUBAL LIGATION      There were no vitals filed for this visit.   Subjective Assessment - 05/20/20 1023    Subjective Doing ok.  Does note she seems to be hurting more after getting off work and coming to therapy sessions.    Pertinent History vertigo, thyroid disease, HLD, HA, R internal carotid aneurysm 2020, B foot surgery    Diagnostic tests 04/14/20 lumbar xray: Disc base narrowing L2-3 through L4-5, Facet arthropathy is noted    Patient Stated Goals decrease pain    Currently in Pain? Yes    Pain Score 5     Pain Location Back    Pain Orientation Lower;Left;Mid    Pain Descriptors / Indicators --   "  its just kind of there"   Pain Type Chronic pain;Acute pain    Pain Frequency Intermittent    Aggravating Factors  prolonged standing and sitting    Multiple Pain Sites No                             [facility] Adult PT Treatment/Exercise - 05/20/20 0001      Self-Care   Self-Care Other Self-Care Comments    Other Self-Care Comments  edu on self-STM with tennis ball on wall to B buttock for pain relief      Lumbar Exercises: Stretches   Single Knee to Chest Stretch Left;1 rep;20 seconds    Single Knee to Chest Stretch Limitations deferred on R due to hip "catching"    Lower Trunk Rotation Limitations 5' x 10     Hip Flexor Stretch Right;30 seconds;Left;1 rep    Hip Flexor Stretch Limitations B mod thomas pos with strap       Lumbar Exercises: Aerobic   Nustep L4 x 6 min (UEs/LEs)      Lumbar Exercises: Supine   Bridge with [employer] 10 reps  Bridge with [employer] Limitations + adduction ball squeeze       Theme park manager L LB    Electrical Stimulation Action IFC    Electrical Stimulation Parameters 80-150Hz , intensity to pt. tolerance, 15'    Electrical Stimulation Goals Tone;Pain      Manual Therapy   Manual Therapy Soft tissue mobilization    Manual therapy comments sidelying     Soft tissue mobilization STM to B glutes/piriformis, lumbar paraspinals     Myofascial Release TPR to R glute                     PT Short Term Goals - 05/13/20 1200      PT SHORT TERM GOAL #1   Title Patient to be independent with initial HEP.    Time 3    Period Weeks    Status Achieved    Target Date 05/13/20             PT Long Term Goals - 04/27/20 1016      PT LONG TERM GOAL #1   Title Patient to be independent with advanced HEP.    Time 6    Period Weeks    Status On-going      PT LONG TERM GOAL #2   Title Patient to demonstrate lumbar AROM WFL and without pain limiting.    Time 6    Period  Weeks    Status On-going      PT LONG TERM GOAL #3   Title Patient to demonstrate R hip AROM WFL and without complaints at end of session.    Time 6    Period Weeks    Status On-going      PT LONG TERM GOAL #4   Title Patient to demonstrate B LE strength >/=4+/5.    Time 6    Period Weeks    Status On-going      PT LONG TERM GOAL #5   Title Patient to report a full day of work without pain.    Time 8    Period Weeks    Status On-going                 Plan - 05/20/20 1032    Clinical Impression Statement Jaime Harris reporting some increased LBP today however notes she always has increased pain after prolonged standing and sitting coming from her work shift.  Progressed hip strengthening and lumbopelvic ROM activities which were tolerated well.  Did have some remaining L lower back pain to end session thus applied moist heat/E-stim to lumbar spine to reduce pain and tone with good relief reported following modalities.    Comorbidities vertigo, thyroid disease, HLD, HA, R internal carotid aneurysm 2020, B foot surgery    Rehab Potential Good    PT Frequency 2x / week    PT Duration 6 weeks    PT Treatment/Interventions ADLs/Self Care Home Management;Cryotherapy;Electrical Stimulation;Iontophoresis 4mg /ml Dexamethasone;Moist Heat;Traction;Balance training;Therapeutic exercise;Therapeutic activities;Functional mobility training;Stair training;Gait training;Ultrasound;Neuromuscular re-education;Patient/family education;Manual techniques;Vestibular;Taping;Energy conservation;Dry needling;Passive range of motion    PT Next Visit Plan progress R hip and lumbar mobility, hip strengthening and stretching    Consulted and Agree with Plan of Care Patient           Patient will benefit from skilled therapeutic intervention in order to improve the following deficits and impairments:  Hypomobility, Decreased activity tolerance, Decreased strength, Pain, Increased fascial restricitons,  Decreased balance, Difficulty walking, Increased muscle spasms,  Improper body mechanics, Decreased range of motion, Impaired flexibility, Postural dysfunction  Visit Diagnosis: Chronic midline low back pain without sciatica  Pain in right hip  Stiffness of right hip, not elsewhere classified  Other symptoms and signs involving the musculoskeletal system     Problem List Patient Active Problem List   Diagnosis Date Noted  . Other intervertebral disc degeneration, lumbar region 04/14/2020  . Obstructive sleep apnea 11/04/2018  . Headache 04/02/2015  . Chronic tension-type headache, intractable 01/26/2015  . Orthostatic hypotension 01/26/2015  . Vitamin D deficiency 12/30/2014  . Migraine 12/25/2014  . Dizziness 12/25/2014  . Tobacco abuse 12/25/2014  . Carpal tunnel syndrome 12/25/2014  . Preventative health care 12/25/2014  . Thyromegaly 11/16/2014  . BPV (benign positional vertigo) 11/16/2014  . Tension headache 11/16/2014  . IBS (irritable bowel syndrome) 11/16/2014    Bess Harvest, PTA 05/20/20 11:48 AM   [facility] [address]  [place], [place], [address] Phone: [phone]   Fax:  [phone]  Name: Jaime Harris MRN: [id number] Date of Birth: 11-Mar-1956

## 2020-05-24 ENCOUNTER — Encounter: Payer: Self-pay | Admitting: Physical Therapy

## 2020-05-24 ENCOUNTER — Ambulatory Visit: Payer: No Typology Code available for payment source | Admitting: Physical Therapy

## 2020-05-24 ENCOUNTER — Other Ambulatory Visit: Payer: Self-pay

## 2020-05-24 DIAGNOSIS — M25651 Stiffness of right hip, not elsewhere classified: Secondary | ICD-10-CM

## 2020-05-24 DIAGNOSIS — R29898 Other symptoms and signs involving the musculoskeletal system: Secondary | ICD-10-CM

## 2020-05-24 DIAGNOSIS — M25551 Pain in right hip: Secondary | ICD-10-CM

## 2020-05-24 DIAGNOSIS — M545 Low back pain: Secondary | ICD-10-CM | POA: Diagnosis not present

## 2020-05-24 DIAGNOSIS — G8929 Other chronic pain: Secondary | ICD-10-CM

## 2020-05-24 NOTE — Patient Instructions (Signed)

## 2020-05-24 NOTE — Therapy (Signed)
[facility] [address]  [place], [place], [address] Phone: [phone]   Fax:  [phone]  Physical Therapy Treatment  Patient Details  Name: Jaime Harris MRN: [id number] Date of Birth: 17-Oct-1955 Referring Provider (PT): Rodell Perna, MD   Encounter Date: 05/24/2020   PT End of Session - 05/24/20 1050    Visit Number 7    Number of Visits 13    Date for PT Re-Evaluation 06/03/20    Authorization Type Aet[employer], VL: [id number]    Authorization - Visit Number 7    Authorization - Number of Visits 20    PT Start Time 1011    PT Stop Time 1049    PT Time Calculation (min) 38 min    Activity Tolerance Patient tolerated treatment well    Behavior During Therapy [employer] for tasks assessed/performed           Past Medical History:  Diagnosis Date  . Aneurysm ([place]) 2020   supraclinoid R int carotid artery  . Chronic tension headaches   . Headache   . Hyperlipidemia    borderline  . Sickle cell trait ([place])   . Thyroid disease    enlarged thyroid  . Vertigo   . Vitamin D deficiency     Past Surgical History:  Procedure Laterality Date  . FOOT SURGERY Bilateral    Pt states she had bones removed from the sides of her feet and middle toes so she could walk better.  . TONSILLECTOMY  1975  . TUBAL LIGATION      There were no vitals filed for this visit.   Subjective Assessment - 05/24/20 1014    Subjective Feeling okay today. HEP is going okay. Unsure why her pain levels had been higher recently, as her pain is pretty mild today.    Pertinent History vertigo, thyroid disease, HLD, HA, R internal carotid aneurysm 2020, B foot surgery    Diagnostic tests 04/14/20 lumbar xray: Disc base narrowing L2-3 through L4-5, Facet arthropathy is noted    Patient Stated Goals decrease pain    Currently in Pain? Yes    Pain Score 1     Pain Location Back    Pain Orientation Lower    Pain Descriptors / Indicators Dull    Pain Type  Acute pain;Chronic pain                             [facility] Adult PT Treatment/Exercise - 05/24/20 0001      Lumbar Exercises: Aerobic   Nustep L4 x 6 min (UEs/LEs)      Knee/Hip Exercises: Stretches   ITB Stretch Right;2 reps;30 seconds    ITB Stretch Limitations L sidelying with R leg behind   cues to roll hips forward     Knee/Hip Exercises: Standing   Knee Flexion Strengthening;Right;Left;1 set;15 reps    Knee Flexion Limitations HS curl with red loop    Wall Squat 10 reps;2 sets    Wall Squat Limitations mini wall squat to tolerance; 2nd set with ball squeeze    Other Standing Knee Exercises Side stepping with red looped TB at forefoot 2 x 25 ft       Knee/Hip Exercises: Sidelying   Clams each LE 2x10, with red TB x10    Other Sidelying Knee/Hip Exercises R/L reverse clamshell with red loop x10 each   PT tightening loop to allow decreased ROM  PT Education - 05/24/20 1049    Education Details education on TENS use and benefits as well as electrode placement, precautions, wear time; administered red loop for HEP    Person(s) Educated Patient    Methods Explanation;Demonstration;Tactile cues;Handout;Verbal cues    Comprehension Verbalized understanding;Returned demonstration            PT Short Term Goals - 05/13/20 1200      PT SHORT TERM GOAL #1   Title Patient to be independent with initial HEP.    Time 3    Period Weeks    Status Achieved    Target Date 05/13/20             PT Long Term Goals - 04/27/20 1016      PT LONG TERM GOAL #1   Title Patient to be independent with advanced HEP.    Time 6    Period Weeks    Status On-going      PT LONG TERM GOAL #2   Title Patient to demonstrate lumbar AROM WFL and without pain limiting.    Time 6    Period Weeks    Status On-going      PT LONG TERM GOAL #3   Title Patient to demonstrate R hip AROM WFL and without complaints at end of session.    Time 6    Period  Weeks    Status On-going      PT LONG TERM GOAL #4   Title Patient to demonstrate B LE strength >/=4+/5.    Time 6    Period Weeks    Status On-going      PT LONG TERM GOAL #5   Title Patient to report a full day of work without pain.    Time 8    Period Weeks    Status On-going                 Plan - 05/24/20 1051    Clinical Impression Statement Patient reporting mild pain levels in the LB today. Worked on lateral hip stretching and strengthening to patient's tolerance. Patient was able to tolerate increased banded resistance with clamshells with good form. Administered red loop to be used at home d/t good tolerance for this resistance today. Able to perform reverse clamshells with more difficulty on R vs. L LE. Shallow wall squats to patient's tolerance were performed without complaints. Did required corrective cues to address anterior trunk lean with sidestepping. Educated patient on personal TENS use for pain management at home as patient reports good benefit from this modality. Patient reported understanding and without complaints at end of session. Patient tolerated today's sessionw well.    Comorbidities vertigo, thyroid disease, HLD, HA, R internal carotid aneurysm 2020, B foot surgery    Rehab Potential Good    PT Frequency 2x / week    PT Duration 6 weeks    PT Treatment/Interventions ADLs/Self Care Home Management;Cryotherapy;Electrical Stimulation;Iontophoresis 4mg /ml Dexamethasone;Moist Heat;Traction;Balance training;Therapeutic exercise;Therapeutic activities;Functional mobility training;Stair training;Gait training;Ultrasound;Neuromuscular re-education;Patient/family education;Manual techniques;Vestibular;Taping;Energy conservation;Dry needling;Passive range of motion    PT Next Visit Plan progress R hip and lumbar mobility, hip strengthening and stretching    Consulted and Agree with Plan of Care Patient           Patient will benefit from skilled therapeutic  intervention in order to improve the following deficits and impairments:  Hypomobility, Decreased activity tolerance, Decreased strength, Pain, Increased fascial restricitons, Decreased balance, Difficulty walking, Increased muscle spasms, Improper body mechanics, Decreased  range of motion, Impaired flexibility, Postural dysfunction  Visit Diagnosis: Chronic midline low back pain without sciatica  Pain in right hip  Stiffness of right hip, not elsewhere classified  Other symptoms and signs involving the musculoskeletal system     Problem List Patient Active Problem List   Diagnosis Date Noted  . Other intervertebral disc degeneration, lumbar region 04/14/2020  . Obstructive sleep apnea 11/04/2018  . Headache 04/02/2015  . Chronic tension-type headache, intractable 01/26/2015  . Orthostatic hypotension 01/26/2015  . Vitamin D deficiency 12/30/2014  . Migraine 12/25/2014  . Dizziness 12/25/2014  . Tobacco abuse 12/25/2014  . Carpal tunnel syndrome 12/25/2014  . Preventative health care 12/25/2014  . Thyromegaly 11/16/2014  . BPV (benign positional vertigo) 11/16/2014  . Tension headache 11/16/2014  . IBS (irritable bowel syndrome) 11/16/2014      Janene Harvey, PT, DPT 05/24/20 10:56 AM   [facility] [address]  [place], [place], [address] Phone: [phone]   Fax:  [phone]  Name: Essance Gatti MRN: [id number] Date of Birth: February 16, 1956

## 2020-05-25 ENCOUNTER — Ambulatory Visit: Payer: No Typology Code available for payment source | Admitting: Orthopaedic Surgery

## 2020-05-27 ENCOUNTER — Other Ambulatory Visit: Payer: Self-pay

## 2020-05-27 ENCOUNTER — Ambulatory Visit: Payer: No Typology Code available for payment source

## 2020-05-27 DIAGNOSIS — R29898 Other symptoms and signs involving the musculoskeletal system: Secondary | ICD-10-CM

## 2020-05-27 DIAGNOSIS — M545 Low back pain: Secondary | ICD-10-CM | POA: Diagnosis not present

## 2020-05-27 DIAGNOSIS — G8929 Other chronic pain: Secondary | ICD-10-CM

## 2020-05-27 DIAGNOSIS — M25551 Pain in right hip: Secondary | ICD-10-CM

## 2020-05-27 DIAGNOSIS — M25651 Stiffness of right hip, not elsewhere classified: Secondary | ICD-10-CM

## 2020-05-27 NOTE — Therapy (Signed)
[facility] [address]  [place], [place], [address] Phone: [phone]   Fax:  [phone]  Physical Therapy Treatment  Patient Details  Name: Jaime Harris MRN: [id number] Date of Birth: 03/04/1956 Referring Provider (PT): Rodell Perna, MD   Encounter Date: 05/27/2020   PT End of Session - 05/27/20 1024    Visit Number 8    Number of Visits 13    Date for PT Re-Evaluation 06/03/20    Authorization Type Aetna, VL: [id number]    Authorization - Visit Number 8    Authorization - Number of Visits 20    PT Start Time 3295    PT Stop Time 1115    PT Time Calculation (min) 57 min    Activity Tolerance Patient tolerated treatment well    Behavior During Therapy [facility] for tasks assessed/performed           Past Medical History:  Diagnosis Date  . Aneurysm ([place]) 2020   supraclinoid R int carotid artery  . Chronic tension headaches   . Headache   . Hyperlipidemia    borderline  . Sickle cell trait ([place])   . Thyroid disease    enlarged thyroid  . Vertigo   . Vitamin D deficiency     Past Surgical History:  Procedure Laterality Date  . FOOT SURGERY Bilateral    Pt states she had bones removed from the sides of her feet and middle toes so she could walk better.  . TONSILLECTOMY  1975  . TUBAL LIGATION      There were no vitals filed for this visit.   Subjective Assessment - 05/27/20 1020    Subjective Pt. noting increased LBP last two days which caused her to call out of work.  Pt. unsure of trigger for L-sided "catching" LBP.  This pain increased Tuesday morning.    Pertinent History vertigo, thyroid disease, HLD, HA, R internal carotid aneurysm 2020, B foot surgery    Diagnostic tests 04/14/20 lumbar xray: Disc base narrowing L2-3 through L4-5, Facet arthropathy is noted    Patient Stated Goals decrease pain    Currently in Pain? Yes    Pain Score 8     Pain Location Back    Pain Orientation Lower;Left    Pain  Descriptors / Indicators Tightness;Pressure    Pain Type Acute pain;Chronic pain    Pain Onset More than a month ago    Pain Frequency Constant    Aggravating Factors  unsure    Multiple Pain Sites No              [facility] PT Assessment - 05/27/20 0001      AROM   AROM Assessment Site Hip;Lumbar    Right/Left Hip Right;Left    Right Hip External Rotation  38    Right Hip Internal Rotation  34    Left Hip External Rotation  45    Left Hip Internal Rotation  33    Lumbar Flexion lower shins    pain    Lumbar Extension moderately limited    pain    Lumbar - Right Side Bend jt line    Lumbar - Left Side Bend jt line    Lumbar - Right Rotation mildly limited    pulling pain    Lumbar - Left Rotation mildly limited    pulling pain      Strength   Strength Assessment Site Knee;Hip    Right/Left Hip Right;Left  Right Hip Flexion 4+/5    Right Hip ABduction 4+/5    Right Hip ADduction 4+/5    Left Hip Flexion 4+/5    Left Hip ABduction 4+/5    Left Hip ADduction 4+/5    Right/Left Knee Right;Left    Right Knee Flexion 4+/5    Right Knee Extension 5/5    Left Knee Flexion 4+/5    Left Knee Extension 5/5    Right/Left Ankle Left;Right    Right Ankle Dorsiflexion 5/5    Right Ankle Plantar Flexion 4+/5    Left Ankle Dorsiflexion 5/5    Left Ankle Plantar Flexion 5/5                         [facility] Adult PT Treatment/Exercise - 05/27/20 0001      Lumbar Exercises: Stretches   Lower Trunk Rotation Limitations 5' x 10       Lumbar Exercises: Aerobic   Nustep L4 x 6 min (UEs/LEs)      Moist Heat Therapy   Number Minutes Moist Heat 15 Minutes    Moist Heat Location Lumbar Spine      Electrical Stimulation   Electrical Stimulation Location L LB    Electrical Stimulation Action IFC    Electrical Stimulation Parameters 80-150Hz , intensity to pt. tolerance, 15'    Electrical Stimulation Goals Tone;Pain                    PT Short Term Goals -  05/13/20 1200      PT SHORT TERM GOAL #1   Title Patient to be independent with initial HEP.    Time 3    Period Weeks    Status Achieved    Target Date 05/13/20             PT Long Term Goals - 05/27/20 1047      PT LONG TERM GOAL #1   Title Patient to be independent with advanced HEP.    Time 6    Period Weeks    Status Partially Met      PT LONG TERM GOAL #2   Title Patient to demonstrate lumbar AROM WFL and without pain limiting.    Time 6    Period Weeks    Status Partially Met      PT LONG TERM GOAL #3   Title Patient to demonstrate R hip AROM WFL and without complaints at end of session.    Time 6    Period Weeks    Status Partially Met      PT LONG TERM GOAL #4   Title Patient to demonstrate B LE strength >/=4+/5.    Time 6    Period Weeks    Status Achieved      PT LONG TERM GOAL #5   Title Patient to report a full day of work without pain.    Time 8    Period Weeks    Status On-going                 Plan - 05/27/20 1034    Clinical Impression Statement Pt. noting 25% overall improvement in pain since starting therapy.  Notes she has been having a recent LBP flare-up which started Tuesday morning without known trigger.  Initial focus in therapy session focused on improving lumbopelvic ROM and reducing pain.  Pt. able to demonstrate improved lumbar, B hip AROM (see objective measurements) partially meeting LTG #2, #  3.  Pt. has met LTG #4 demonstrating at least 4+/5 strength of B LEs with MMT.  Pt. reporting recent LBP flare-up has caused her to have to call out of work the last two days which is unusual for her.  LTG #5 ongoing as pt. not yet able to progress throughout full day of work without pain.  Pt. will continue to benefit from further skilled therapy to improve functional ROM and reduce pain with work-related tasks.    Comorbidities vertigo, thyroid disease, HLD, HA, R internal carotid aneurysm 2020, B foot surgery    Rehab Potential Good     PT Frequency 2x / week    PT Duration 6 weeks    PT Treatment/Interventions ADLs/Self Care Home Management;Cryotherapy;Electrical Stimulation;Iontophoresis 72m/ml Dexamethasone;Moist Heat;Traction;Balance training;Therapeutic exercise;Therapeutic activities;Functional mobility training;Stair training;Gait training;Ultrasound;Neuromuscular re-education;Patient/family education;Manual techniques;Vestibular;Taping;Energy conservation;Dry needling;Passive range of motion    PT Next Visit Plan progress R hip and lumbar mobility, hip strengthening and stretching    Consulted and Agree with Plan of Care Patient           Patient will benefit from skilled therapeutic intervention in order to improve the following deficits and impairments:  Hypomobility, Decreased activity tolerance, Decreased strength, Pain, Increased fascial restricitons, Decreased balance, Difficulty walking, Increased muscle spasms, Improper body mechanics, Decreased range of motion, Impaired flexibility, Postural dysfunction  Visit Diagnosis: Chronic midline low back pain without sciatica  Pain in right hip  Stiffness of right hip, not elsewhere classified  Other symptoms and signs involving the musculoskeletal system     Problem List Patient Active Problem List   Diagnosis Date Noted  . Other intervertebral disc degeneration, lumbar region 04/14/2020  . Obstructive sleep apnea 11/04/2018  . Headache 04/02/2015  . Chronic tension-type headache, intractable 01/26/2015  . Orthostatic hypotension 01/26/2015  . Vitamin D deficiency 12/30/2014  . Migraine 12/25/2014  . Dizziness 12/25/2014  . Tobacco abuse 12/25/2014  . Carpal tunnel syndrome 12/25/2014  . Preventative health care 12/25/2014  . Thyromegaly 11/16/2014  . BPV (benign positional vertigo) 11/16/2014  . Tension headache 11/16/2014  . IBS (irritable bowel syndrome) 11/16/2014    MBess Harvest PTA 05/27/20 1:12 PM   [facility] [address] [place] [address]Phone: [phone]  Fax:  [phone] Name: Jaime FloydMRN: [id number] of Birth: 706/25/1957

## 2020-06-01 ENCOUNTER — Ambulatory Visit (INDEPENDENT_AMBULATORY_CARE_PROVIDER_SITE_OTHER): Payer: No Typology Code available for payment source | Admitting: Orthopaedic Surgery

## 2020-06-01 ENCOUNTER — Encounter: Payer: Self-pay | Admitting: Orthopaedic Surgery

## 2020-06-01 ENCOUNTER — Encounter: Payer: Self-pay | Admitting: Physical Therapy

## 2020-06-01 VITALS — BP 158/79 | HR 72 | Ht 65.0 in | Wt 180.0 lb

## 2020-06-01 DIAGNOSIS — M545 Low back pain: Secondary | ICD-10-CM

## 2020-06-01 DIAGNOSIS — G8929 Other chronic pain: Secondary | ICD-10-CM | POA: Diagnosis not present

## 2020-06-01 DIAGNOSIS — M5136 Other intervertebral disc degeneration, lumbar region: Secondary | ICD-10-CM | POA: Diagnosis not present

## 2020-06-01 NOTE — Progress Notes (Signed)
Office Visit Note   Patient: Jaime Harris           Date of Birth: 1956-05-03           MRN: [id number] Visit Date: 06/01/2020              Requested by: Debbrah Alar, NP [address] [place],  [place] [address] PCP: Debbrah Alar, NP   Assessment & Plan: Visit Diagnoses:  1. Chronic bilateral low back pain without sciatica   2. Other intervertebral disc degeneration, lumbar region     Plan: We will proceed with MRI scan due to her persistent pain lower extremity weakness and failure to respond to therapy and medications. Office follow-up after MRI scan.  Follow-Up Instructions: Office follow-up after MRI scan.  Orders:  Orders Placed This Encounter  Procedures  . MR Lumbar Spine w/o contrast   No orders of the defined types were placed in this encounter.     Procedures: No procedures performed   Clinical Data: No additional findings.   Subjective: Chief Complaint  Patient presents with  . Lower Back - Follow-up    HPI 64 year old female with 5-week history of low back pain. She has been going to therapy 2 times a week x1 month without significant improvement she is think she is gotten a little bit improvement with the therapy. She is used meloxicam which seems to help some. She has pain in her right trochanteric region also left leg in her buttocks that radiates down the leg. She noticed some numbness that goes down to her feet on the dorsum. Plain radiographs 04/14/2020 showed disc space narrowing with 2 mm anterolisthesis at L4-5 that did not change with flexion extension. Facet arthropathy L4-5 on the right.  Patient states she cannot stand long has increased pain with walking. Pain bothers her at night when she tries to sleep.  Review of Systems i 14 point update unchanged from 04/14/2020 office visit.   Objective: Vital Signs: BP (!) 158/79   Pulse 72   Ht 5\' 5"  (1.651 m)   Wt 180 lb (81.6 kg)   BMI 29.95 kg/m   Physical  Exam Constitutional:      Appearance: She is well-developed.  HENT:     Head: Normocephalic.     Right Ear: External ear normal.     Left Ear: External ear normal.  Eyes:     Pupils: Pupils are equal, round, and reactive to light.  Neck:     Thyroid: No thyromegaly.     Trachea: No tracheal deviation.  Cardiovascular:     Rate and Rhythm: Normal rate.  Pulmonary:     Effort: Pulmonary effort is normal.  Abdominal:     Palpations: Abdomen is soft.  Skin:    General: Skin is warm and dry.  Neurological:     Mental Status: She is alert and oriented to person, place, and time.  Psychiatric:        Behavior: Behavior normal.     Ortho Exam patient slow getting from sitting to standing. She has some pain with straight leg raising 90 degrees both right and left sciatic notch tenderness bilaterally. Trochanteric bursal tenderness on the right. Some pain with popliteal compression right left. Anterior tib gastrocsoleus is intact no lower extremity atrophy.  Specialty Comments:  No specialty comments available.  Imaging: No results found.   PMFS History: Patient Active Problem List   Diagnosis Date Noted  . Other intervertebral disc degeneration,  lumbar region 04/14/2020  . Obstructive sleep apnea 11/04/2018  . Headache 04/02/2015  . Chronic tension-type headache, intractable 01/26/2015  . Orthostatic hypotension 01/26/2015  . Vitamin D deficiency 12/30/2014  . Migraine 12/25/2014  . Dizziness 12/25/2014  . Tobacco abuse 12/25/2014  . Carpal tunnel syndrome 12/25/2014  . Preventative health care 12/25/2014  . Thyromegaly 11/16/2014  . BPV (benign positional vertigo) 11/16/2014  . Tension headache 11/16/2014  . IBS (irritable bowel syndrome) 11/16/2014   Past Medical History:  Diagnosis Date  . Aneurysm ([place]) 2020   supraclinoid R int carotid artery  . Chronic tension headaches   . Headache   . Hyperlipidemia    borderline  . Sickle cell trait ([place])   . Thyroid  disease    enlarged thyroid  . Vertigo   . Vitamin D deficiency     Family History  Problem Relation Age of Onset  . Heart disease Mother        Investment banker, corporate  . Diabetes Mother   . Heart disease Paternal Grandmother        died of CHF  . Diabetes Paternal Grandmother   . Diabetes Brother   . Cancer Neg Hx   . Kidney disease Neg Hx   . Colon cancer Neg Hx   . Colon polyps Neg Hx   . Rectal cancer Neg Hx   . Stomach cancer Neg Hx     Past Surgical History:  Procedure Laterality Date  . FOOT SURGERY Bilateral    Pt states she had bones removed from the sides of her feet and middle toes so she could walk better.  . TONSILLECTOMY  1975  . TUBAL LIGATION     Social History   Occupational History    Comment: Cashier  Tobacco Use  . Smoking status: Current Every Day Smoker    Packs/day: 0.75    Types: Cigarettes  . Smokeless tobacco: Never Used  Substance and Sexual Activity  . Alcohol use: Yes    Comment: occasional  . Drug use: No  . Sexual activity: Not on file

## 2020-06-03 ENCOUNTER — Ambulatory Visit: Payer: No Typology Code available for payment source | Admitting: Physical Therapy

## 2020-06-03 ENCOUNTER — Other Ambulatory Visit: Payer: Self-pay

## 2020-06-03 ENCOUNTER — Encounter: Payer: Self-pay | Admitting: Physical Therapy

## 2020-06-03 DIAGNOSIS — M25651 Stiffness of right hip, not elsewhere classified: Secondary | ICD-10-CM

## 2020-06-03 DIAGNOSIS — M545 Low back pain, unspecified: Secondary | ICD-10-CM

## 2020-06-03 DIAGNOSIS — R29898 Other symptoms and signs involving the musculoskeletal system: Secondary | ICD-10-CM

## 2020-06-03 DIAGNOSIS — G8929 Other chronic pain: Secondary | ICD-10-CM

## 2020-06-03 DIAGNOSIS — M25551 Pain in right hip: Secondary | ICD-10-CM

## 2020-06-03 NOTE — Therapy (Signed)
[facility] [address]  [place], [place], [address] Phone: [phone]   Fax:  [phone]  Physical Therapy Treatment  Patient Details  Name: Jaime Harris MRN: [id number] Date of Birth: [birth year] Referring Provider (PT): Rodell Perna, MD   Encounter Date: 06/03/2020   PT End of Session - 06/03/20 1058    Visit Number 9    Number of Visits 13    Date for PT Re-Evaluation 06/03/20    Authorization Type [employer], VL: [id number]    Authorization - Visit Number 9    Authorization - Number of Visits 20    PT Start Time 7902    PT Stop Time 1054    PT Time Calculation (min) 39 min    Activity Tolerance Patient tolerated treatment well;Patient limited by pain    Behavior During Therapy [facility] for tasks assessed/performed           Past Medical History:  Diagnosis Date   Aneurysm ([place]) 2020   supraclinoid R int carotid artery   Chronic tension headaches    Headache    Hyperlipidemia    borderline   Sickle cell trait ([place])    Thyroid disease    enlarged thyroid   Vertigo    Vitamin D deficiency     Past Surgical History:  Procedure Laterality Date   FOOT SURGERY Bilateral    Pt states she had bones removed from the sides of her feet and middle toes so she could walk better.   TONSILLECTOMY  1975   TUBAL LIGATION      There were no vitals filed for this visit.   Subjective Assessment - 06/03/20 1018    Subjective MD ordered an MRI on her back. Back has been feeling about the same but felt good last night. Has felt more tightness vs. pain in her back. However, has noticed some L hip pain.    Pertinent History vertigo, thyroid disease, HLD, HA, R internal carotid aneurysm 2020, B foot surgery    Diagnostic tests 04/14/20 lumbar xray: Disc base narrowing L2-3 through L4-5, Facet arthropathy is noted    Patient Stated Goals decrease pain    Currently in Pain? No/denies                              [facility] Adult PT Treatment/Exercise - 06/03/20 0001      Lumbar Exercises: Aerobic   Recumbent Bike L1 x 52mn      Lumbar Exercises: Standing   Other Standing Lumbar Exercises R/L open book stretch at wall x10 each   to tolerance     Knee/Hip Exercises: Stretches   ITB Stretch Left;2 reps;30 seconds    ITB Stretch Limitations supine with strap      Knee/Hip Exercises: Standing   Wall Squat 1 set;15 reps    Wall Squat Limitations mini wall squat to tolerance      Knee/Hip Exercises: Sidelying   Hip ABduction Strengthening;Right;Left;1 set;10 reps    Hip ABduction Limitations 1#   more difficulty on L     Manual Therapy   Manual Therapy Soft tissue mobilization;Myofascial release    Manual therapy comments prone    Soft tissue mobilization STM to L proximal glutes, glute med, and piriformis    Myofascial Release manual TPR to L proximal glutes, glute med, and piriformis   palpable soft tissue restriction and pain throughout  PT Education - 06/03/20 1058    Education Details update to HEP    Person(s) Educated Patient    Methods Explanation;Demonstration;Tactile cues;Verbal cues;Handout    Comprehension Returned demonstration;Verbalized understanding            PT Short Term Goals - 05/13/20 1200      PT SHORT TERM GOAL #1   Title Patient to be independent with initial HEP.    Time 3    Period Weeks    Status Achieved    Target Date 05/13/20             PT Long Term Goals - 05/27/20 1047      PT LONG TERM GOAL #1   Title Patient to be independent with advanced HEP.    Time 6    Period Weeks    Status Partially Met      PT LONG TERM GOAL #2   Title Patient to demonstrate lumbar AROM WFL and without pain limiting.    Time 6    Period Weeks    Status Partially Met      PT LONG TERM GOAL #3   Title Patient to demonstrate R hip AROM WFL and without complaints at end of session.    Time 6     Period Weeks    Status Partially Met      PT LONG TERM GOAL #4   Title Patient to demonstrate B LE strength >/=4+/5.    Time 6    Period Weeks    Status Achieved      PT LONG TERM GOAL #5   Title Patient to report a full day of work without pain.    Time 8    Period Weeks    Status On-going                 Plan - 06/03/20 1059    Clinical Impression Statement Patient reporting increased L hip pain without known cause recently, however no pain at start of session. Addressed soft tissue restriction over L buttock d/t patients c/o increased L hip pain recently. Patient demonstrated palpable soft tissue restriction and pain throughout L proximal glutes, glute med, and piriformis with prominent jump sign with palpation over lateral glute. Proceeded with gentle hip stretching with good tolerance. Patient performed progressive hip strengthening ther-ex with added resistance with more difficulty on L LE than R LE and c/o L lateral hip pain. Reported good stretch in this same region with TFL stretching.  Proceeded with B LE strengthening and lumbar stretching with care to avoid excessive strain on hips. Updated HEP with lateral hip stretching and encouraged patient to use self-massage. Patient reported understanding and declined modalities for pain at end of session.    Comorbidities vertigo, thyroid disease, HLD, HA, R internal carotid aneurysm 2020, B foot surgery    Rehab Potential Good    PT Frequency 2x / week    PT Duration 6 weeks    PT Treatment/Interventions ADLs/Self Care Home Management;Cryotherapy;Electrical Stimulation;Iontophoresis 94m/ml Dexamethasone;Moist Heat;Traction;Balance training;Therapeutic exercise;Therapeutic activities;Functional mobility training;Stair training;Gait training;Ultrasound;Neuromuscular re-education;Patient/family education;Manual techniques;Vestibular;Taping;Energy conservation;Dry needling;Passive range of motion    PT Next Visit Plan progress R hip  and lumbar mobility, hip strengthening and stretching    Consulted and Agree with Plan of Care Patient           Patient will benefit from skilled therapeutic intervention in order to improve the following deficits and impairments:  Hypomobility, Decreased activity tolerance, Decreased strength, Pain, Increased fascial  restricitons, Decreased balance, Difficulty walking, Increased muscle spasms, Improper body mechanics, Decreased range of motion, Impaired flexibility, Postural dysfunction  Visit Diagnosis: Chronic midline low back pain without sciatica  Pain in right hip  Stiffness of right hip, not elsewhere classified  Other symptoms and signs involving the musculoskeletal system     Problem List Patient Active Problem List   Diagnosis Date Noted   Other intervertebral disc degeneration, lumbar region 04/14/2020   Obstructive sleep apnea 11/04/2018   Headache 04/02/2015   Chronic tension-type headache, intractable 01/26/2015   Orthostatic hypotension 01/26/2015   Vitamin D deficiency 12/30/2014   Migraine 12/25/2014   Dizziness 12/25/2014   Tobacco abuse 12/25/2014   Carpal tunnel syndrome 12/25/2014   Preventative health care 12/25/2014   Thyromegaly 11/16/2014   BPV (benign positional vertigo) 11/16/2014   Tension headache 11/16/2014   IBS (irritable bowel syndrome) 11/16/2014     Janene Harvey, PT, DPT 06/03/20 11:00 AM   [facility] [address]  [place], [place], [address] Phone: [phone]   Fax:  [phone]  Name: Jaime Harris MRN: [id number] Date of Birth: Jun 29, 1956

## 2020-06-07 ENCOUNTER — Ambulatory Visit: Payer: No Typology Code available for payment source | Admitting: Physical Therapy

## 2020-06-14 ENCOUNTER — Other Ambulatory Visit: Payer: Self-pay

## 2020-06-14 ENCOUNTER — Ambulatory Visit (INDEPENDENT_AMBULATORY_CARE_PROVIDER_SITE_OTHER): Payer: No Typology Code available for payment source | Admitting: Family

## 2020-06-14 ENCOUNTER — Other Ambulatory Visit (HOSPITAL_BASED_OUTPATIENT_CLINIC_OR_DEPARTMENT_OTHER): Payer: Self-pay | Admitting: Family

## 2020-06-14 VITALS — BP 141/77 | HR 0 | Temp 97.6°F | Resp 16 | Ht 63.0 in | Wt 180.2 lb

## 2020-06-14 DIAGNOSIS — R059 Cough, unspecified: Secondary | ICD-10-CM | POA: Diagnosis not present

## 2020-06-14 DIAGNOSIS — Z720 Tobacco use: Secondary | ICD-10-CM

## 2020-06-14 MED ORDER — BUPROPION HCL ER (SR) 100 MG PO TB12
100.0000 mg | ORAL_TABLET | Freq: Two times a day (BID) | ORAL | 2 refills | Status: DC
Start: 2020-06-14 — End: 2020-10-12

## 2020-06-14 MED ORDER — OMEPRAZOLE 40 MG PO CPDR: 40.0000 mg | DELAYED_RELEASE_CAPSULE | Freq: Every day | ORAL | 3 refills | Status: DC

## 2020-06-14 MED FILL — buPROPion HCL ER (SR) 100 M: 100 | 30 days supply | Qty: 60 | Fill #0

## 2020-06-14 MED FILL — OMEPRAZOLE 40 MG CPDR: 40 | 30 days supply | Qty: 30 | Fill #0

## 2020-06-14 NOTE — Patient Instructions (Signed)
Please begin wellbutrin and omeprazole. Good luck quitting smoking.

## 2020-06-14 NOTE — Progress Notes (Signed)
Subjective:    Patient ID: Jaime Harris, female    DOB: 1956/03/06, 63 y.o.   MRN: [id number]  HPI  Patient is a 64 yr old female who presents today for follow up of her cough. I last saw her on 04/28/20. We obtained a chest x-ray at that time. CXR was normal.  She reports cough at night when she lays down.  She is no longer taking omeprazole.  She is smoking 10-15 cigarettes a day.  She wants to quit smoking.  She is interested in alternative to Chantix as this has been pulled from the market.  Wt Readings from Last 3 Encounters:  06/14/20 180 lb 3.2 oz (81.7 kg)  06/01/20 180 lb (81.6 kg)  04/28/20 183 lb 6.4 oz (83.2 kg)    Review of Systems See HPI  Past Medical History:  Diagnosis Date  . Aneurysm ([place]) 2020   supraclinoid R int carotid artery  . Chronic tension headaches   . Headache   . Hyperlipidemia    borderline  . Sickle cell trait ([place])   . Thyroid disease    enlarged thyroid  . Vertigo   . Vitamin D deficiency      Social History   Socioeconomic History  . Marital status: Single    Spouse name: Not on file  . Number of children: 3  . Years of education: Not on file  . Highest education level: High school graduate  Occupational History    Comment: Cashier  Tobacco Use  . Smoking status: Current Every Day Smoker    Packs/day: 0.75    Types: Cigarettes  . Smokeless tobacco: Never Used  Substance and Sexual Activity  . Alcohol use: Yes    Comment: occasional  . Drug use: No  . Sexual activity: Not on file  Other Topics Concern  . Not on file  Social History Narrative   Makes eye glasses.     Single   GED   She has 3 children   4- Son [name]- lives locally- he has one son   71- Daughter [name]- 7 children (4 of these children live with the pt- she is a primary care giver)   [place]- lives in [place] 1 daughter   Enjoys reading   Caffeine- coffee, 4 cups daily, soda every other day   Social Determinants of Health   Financial  Resource Strain:   . Difficulty of Paying Living Expenses: Not on file  Food Insecurity:   . Worried About Charity fundraiser in the Last Year: Not on file  . Ran Out of Food in the Last Year: Not on file  Transportation Needs:   . Lack of Transportation (Medical): Not on file  . Lack of Transportation (Non-Medical): Not on file  Physical Activity:   . Days of Exercise per Week: Not on file  . Minutes of Exercise per Session: Not on file  Stress:   . Feeling of Stress : Not on file  Social Connections:   . Frequency of Communication with Friends and Family: Not on file  . Frequency of Social Gatherings with Friends and Family: Not on file  . Attends Religious Services: Not on file  . Active Member of Clubs or Organizations: Not on file  . Attends Archivist Meetings: Not on file  . Marital Status: Not on file  Intimate Partner Violence:   . Fear of Current or Ex-Partner: Not on file  . Emotionally Abused: Not on file  .  Physically Abused: Not on file  . Sexually Abused: Not on file    Past Surgical History:  Procedure Laterality Date  . FOOT SURGERY Bilateral    Pt states she had bones removed from the sides of her feet and middle toes so she could walk better.  . TONSILLECTOMY  1975  . TUBAL LIGATION      Family History  Problem Relation Age of Onset  . Heart disease Mother        Investment banker, corporate  . Diabetes Mother   . Heart disease Paternal Grandmother        died of CHF  . Diabetes Paternal Grandmother   . Diabetes Brother   . Cancer Neg Hx   . Kidney disease Neg Hx   . Colon cancer Neg Hx   . Colon polyps Neg Hx   . Rectal cancer Neg Hx   . Stomach cancer Neg Hx     No Known Allergies  Current Outpatient Medications on File Prior to Visit  Medication Sig Dispense Refill  . ibuprofen (ADVIL) 800 MG tablet Take 800 mg by mouth every 6 (six) hours as needed.    . meloxicam (MOBIC) 7.5 MG tablet Take 1 tablet (7.5 mg total) by mouth daily as  needed for pain. 30 tablet 3  . nystatin (MYCOSTATIN/NYSTOP) powder Apply 1 application topically 2 (two) times daily. 30 g 2  . omeprazole (PRILOSEC) 40 MG capsule Take 1 capsule (40 mg total) by mouth daily. 30 capsule 3  . varenicline (CHANTIX STARTING MONTH PAK) 0.5 MG X 11 & 1 MG X 42 tablet one 0.5 mg tab by mouth 1 x  daily for 3 days, then increase to one 0.5 mg tablet 2 x daily for 4 days, then increase to one 1 mg tab bid 53 tablet 0  . [DISCONTINUED] gabapentin (NEURONTIN) 100 MG capsule Take 1 capsule (100 mg total) by mouth 3 (three) times daily. 60 capsule 0   No current facility-administered medications on file prior to visit.    BP (!) 141/77 (BP Location: Right Arm, Patient Position: Sitting, Cuff Size: Small)   Pulse (!) 0   Temp 97.6 F (36.4 C) (Oral)   Resp 16   Ht 5\' 3"  (1.6 m)   Wt 180 lb 3.2 oz (81.7 kg)   SpO2 100%   BMI 31.92 kg/m       Objective:   Physical Exam Constitutional:      Appearance: She is well-developed.  Cardiovascular:     Rate and Rhythm: Normal rate and regular rhythm.     Heart sounds: Normal heart sounds. No murmur heard.   Pulmonary:     Effort: Pulmonary effort is normal. No respiratory distress.     Breath sounds: Normal breath sounds. No wheezing.  Psychiatric:        Behavior: Behavior normal.        Thought Content: Thought content normal.        Judgment: Judgment normal.           Assessment & Plan:  Cough-likely multifactorial.  I suspect that she has some GERD as her symptoms are worse when laying flat.  Will resume omeprazole 40 mg once daily.  We also discussed that smoking is likely a contributing factor.  Tobacco abuse-she is motivated to quit.  Patient is counseled on importance of quitting smoking.  We discussed nicotine replacement versus bupropion.  She prefers to try bupropion first.  Rx was sent for Wellbutrin.  This visit  occurred during the SARS-CoV-2 public health emergency.  Safety protocols were in  place, including screening questions prior to the visit, additional usage of staff PPE, and extensive cleaning of exam room while observing appropriate contact time as indicated for disinfecting solutions.

## 2020-06-19 ENCOUNTER — Other Ambulatory Visit: Payer: No Typology Code available for payment source

## 2020-06-21 ENCOUNTER — Ambulatory Visit: Payer: No Typology Code available for payment source | Attending: Orthopaedic Surgery | Admitting: Physical Therapy

## 2020-06-21 ENCOUNTER — Encounter: Payer: Self-pay | Admitting: Physical Therapy

## 2020-06-21 ENCOUNTER — Other Ambulatory Visit: Payer: Self-pay

## 2020-06-21 DIAGNOSIS — G8929 Other chronic pain: Secondary | ICD-10-CM | POA: Diagnosis present

## 2020-06-21 DIAGNOSIS — M25551 Pain in right hip: Secondary | ICD-10-CM | POA: Diagnosis present

## 2020-06-21 DIAGNOSIS — R29898 Other symptoms and signs involving the musculoskeletal system: Secondary | ICD-10-CM | POA: Insufficient documentation

## 2020-06-21 DIAGNOSIS — M25651 Stiffness of right hip, not elsewhere classified: Secondary | ICD-10-CM | POA: Insufficient documentation

## 2020-06-21 DIAGNOSIS — M545 Low back pain, unspecified: Secondary | ICD-10-CM | POA: Insufficient documentation

## 2020-06-21 NOTE — Therapy (Addendum)
[facility] [address]  [place], [place], [address] Phone: [phone]   Fax:  [phone]  Physical Therapy Treatment  Patient Details  Name: Jaime Harris MRN: [id number] Date of Birth: 1956-07-30 Referring Provider (PT): Rodell Perna, MD   Encounter Date: 06/21/2020   PT End of Session - 06/21/20 1056    Visit Number 10    Number of Visits 13    Date for PT Re-Evaluation 06/03/20    Authorization Type Aetna, VL: 36    Authorization - Visit Number 10    Authorization - Number of Visits 20    PT Start Time 1017    PT Stop Time 1058    PT Time Calculation (min) 41 min    Activity Tolerance Patient tolerated treatment well;Patient limited by pain    Behavior During Therapy [facility] for tasks assessed/performed           Past Medical History:  Diagnosis Date  . Aneurysm ([place]) 2020   supraclinoid R int carotid artery  . Chronic tension headaches   . Headache   . Hyperlipidemia    borderline  . Sickle cell trait ([place])   . Thyroid disease    enlarged thyroid  . Vertigo   . Vitamin D deficiency     Past Surgical History:  Procedure Laterality Date  . FOOT SURGERY Bilateral    Pt states she had bones removed from the sides of her feet and middle toes so she could walk better.  . TONSILLECTOMY  1975  . TUBAL LIGATION      There were no vitals filed for this visit.   Subjective Assessment - 06/21/20 1018    Subjective Has been doing pretty good. Feels like the exercises have helped as her pain in nowhere near as bad as it was. Also has been getting some more rest.    Pertinent History vertigo, thyroid disease, HLD, HA, R internal carotid aneurysm 2020, B foot surgery    Diagnostic tests 04/14/20 lumbar xray: Disc base narrowing L2-3 through L4-5, Facet arthropathy is noted    Patient Stated Goals decrease pain    Currently in Pain? No/denies                             [facility] Adult PT  Treatment/Exercise - 06/21/20 0001      Lumbar Exercises: Aerobic   Nustep L4 x 6 min (UEs/LEs)      Knee/Hip Exercises: Stretches   Other Knee/Hip Stretches R/L sitting ER stretch with forward lean 2x30" each      Knee/Hip Exercises: Standing   Hip Flexion Stengthening;Right;Left;1 set;Knee straight    Hip Flexion Limitations 2 ski poles; red TB    Hip ADduction Strengthening;Right;Left;1 set;10 reps    Hip ADduction Limitations 2 ski poles; red TB    Hip Abduction Stengthening;Right;Left;1 set;10 reps;Knee straight    Abduction Limitations 2 ski poles; red TB    Hip Extension Stengthening;Right;Left;1 set;10 reps;Knee straight    Extension Limitations 2 ski poles; red TB   cues to avoid anteiror trunk lean   Wall Squat 2 sets;10 reps    Wall Squat Limitations w/ red TB 1st set; ball squeeze 2nd set   cueing for foot positioning   Other Standing Knee Exercises Side stepping with red looped TB at forefoot 2 x 30 ft       Knee/Hip Exercises: Prone   Hamstring Curl 1  set;10 reps    Hamstring Curl Limitations 4#; 5 sec eccentric lower each LE                  PT Education - 06/21/20 1055    Education Details update to HEP    Person(s) Educated Patient    Methods Explanation;Demonstration;Tactile cues;Verbal cues;Handout    Comprehension Verbalized understanding;Returned demonstration            PT Short Term Goals - 05/13/20 1200      PT SHORT TERM GOAL #1   Title Patient to be independent with initial HEP.    Time 3    Period Weeks    Status Achieved    Target Date 05/13/20             PT Long Term Goals - 05/27/20 1047      PT LONG TERM GOAL #1   Title Patient to be independent with advanced HEP.    Time 6    Period Weeks    Status Partially Met      PT LONG TERM GOAL #2   Title Patient to demonstrate lumbar AROM WFL and without pain limiting.    Time 6    Period Weeks    Status Partially Met      PT LONG TERM GOAL #3   Title Patient to  demonstrate R hip AROM WFL and without complaints at end of session.    Time 6    Period Weeks    Status Partially Met      PT LONG TERM GOAL #4   Title Patient to demonstrate B LE strength >/=4+/5.    Time 6    Period Weeks    Status Achieved      PT LONG TERM GOAL #5   Title Patient to report a full day of work without pain.    Time 8    Period Weeks    Status On-going                 Plan - 06/21/20 1058    Clinical Impression Statement Patient reporting improved pain levels lately- noting good benefit from HEP and notes that she has also been getting more rest. Scheduled for a lumbar MRI this Friday. Worked on increasing challenge with squatting activities, with patient demonstrating a slightly deeper squat and good tolerance despite report of muscle fatigue. Initiated 4 way hip with banded resistance. Patient tolerated this well and only required minor cueing for form. Initiated modified pigeon pose stretch with patient demonstrating more tightness on R vs. L LE. Updated HEP with this exercise- patient reported understanding and without complaints at end of session.    Comorbidities vertigo, thyroid disease, HLD, HA, R internal carotid aneurysm 2020, B foot surgery    Rehab Potential Good    PT Frequency 2x / week    PT Duration 6 weeks    PT Treatment/Interventions ADLs/Self Care Home Management;Cryotherapy;Electrical Stimulation;Iontophoresis 3m/ml Dexamethasone;Moist Heat;Traction;Balance training;Therapeutic exercise;Therapeutic activities;Functional mobility training;Stair training;Gait training;Ultrasound;Neuromuscular re-education;Patient/family education;Manual techniques;Vestibular;Taping;Energy conservation;Dry needling;Passive range of motion    PT Next Visit Plan progress R hip and lumbar mobility, hip strengthening and stretching    Consulted and Agree with Plan of Care Patient           Patient will benefit from skilled therapeutic intervention in order to  improve the following deficits and impairments:  Hypomobility, Decreased activity tolerance, Decreased strength, Pain, Increased fascial restricitons, Decreased balance, Difficulty walking, Increased muscle spasms, Improper body  mechanics, Decreased range of motion, Impaired flexibility, Postural dysfunction  Visit Diagnosis: Chronic midline low back pain without sciatica  Pain in right hip  Stiffness of right hip, not elsewhere classified  Other symptoms and signs involving the musculoskeletal system     Problem List Patient Active Problem List   Diagnosis Date Noted  . Other intervertebral disc degeneration, lumbar region 04/14/2020  . Obstructive sleep apnea 11/04/2018  . Headache 04/02/2015  . Chronic tension-type headache, intractable 01/26/2015  . Orthostatic hypotension 01/26/2015  . Vitamin D deficiency 12/30/2014  . Migraine 12/25/2014  . Dizziness 12/25/2014  . Tobacco abuse 12/25/2014  . Carpal tunnel syndrome 12/25/2014  . Preventative health care 12/25/2014  . Thyromegaly 11/16/2014  . BPV (benign positional vertigo) 11/16/2014  . Tension headache 11/16/2014  . IBS (irritable bowel syndrome) 11/16/2014     Janene Harvey, PT, DPT 06/21/20 11:00 AM   [facility] [address]  [place], [place], [address] Phone: [phone]   Fax:  [phone]  Name: Jaime Harris MRN: [id number] Date of Birth: [birth year]  PHYSICAL THERAPY DISCHARGE SUMMARY  Visits from Start of Care: 10  Current functional level related to goals / functional outcomes: Unable to assess; patient did not return   Remaining deficits: Unable to assess   Education / Equipment: HEP  Plan: Patient agrees to discharge.  Patient goals were partially met. Patient is being discharged due to not returning since the last visit.  ?????     Janene Harvey, PT, DPT 07/27/20 3:26 PM

## 2020-06-22 ENCOUNTER — Telehealth: Payer: Self-pay | Admitting: Orthopaedic Surgery

## 2020-06-22 NOTE — Telephone Encounter (Signed)
Called patient left message to return call to schedule an appointment for MRI review with Dr Lorin Mercy.  MRI is scheduled for 06/25/2020

## 2020-06-25 ENCOUNTER — Encounter: Payer: No Typology Code available for payment source | Admitting: Physical Therapy

## 2020-06-25 ENCOUNTER — Ambulatory Visit
Admission: RE | Admit: 2020-06-25 | Discharge: 2020-06-25 | Disposition: A | Discharge status: Home / Self Care | Payer: No Typology Code available for payment source | Source: Ambulatory Visit | Attending: Orthopaedic Surgery | Admitting: Orthopaedic Surgery

## 2020-06-25 DIAGNOSIS — M545 Low back pain, unspecified: Secondary | ICD-10-CM

## 2020-06-25 DIAGNOSIS — G8929 Other chronic pain: Secondary | ICD-10-CM

## 2020-06-25 DIAGNOSIS — M5136 Other intervertebral disc degeneration, lumbar region: Secondary | ICD-10-CM

## 2020-06-29 ENCOUNTER — Ambulatory Visit (INDEPENDENT_AMBULATORY_CARE_PROVIDER_SITE_OTHER): Payer: No Typology Code available for payment source | Admitting: Orthopaedic Surgery

## 2020-06-29 ENCOUNTER — Encounter: Payer: Self-pay | Admitting: Orthopaedic Surgery

## 2020-06-29 VITALS — Ht 63.0 in | Wt 180.0 lb

## 2020-06-29 DIAGNOSIS — M5136 Other intervertebral disc degeneration, lumbar region: Secondary | ICD-10-CM | POA: Diagnosis not present

## 2020-06-29 NOTE — Progress Notes (Signed)
Office Visit Note   Patient: Jaime Harris           Date of Birth: 06-04-1956           MRN: [id number] Visit Date: 06/29/2020              Requested by: Debbrah Alar, NP [address] [place],  [place] [address] PCP: Debbrah Alar, NP   Assessment & Plan: Visit Diagnoses:  1. Other intervertebral disc degeneration, lumbar region     Plan: MRI scan was reviewed today.  I do not think she needs surgery at this point.  She needs to work on losing some weight gradually is and continue the therapy exercises to help with her back symptoms.  We discussed that the protrusions may improve with time.  If she has increase in symptoms we offered her an epidural injection and she will call if she would like to proceed with epidural injections at L2-3 and L3-4 on the left side.  Questions were elicited and answered.  I gave her a copy of the report.  Follow-Up Instructions: No follow-ups on file.   Orders:  No orders of the defined types were placed in this encounter.  No orders of the defined types were placed in this encounter.     Procedures: No procedures performed   Clinical Data: No additional findings.   Subjective: Chief Complaint  Patient presents with  . Lower Back - Pain, Follow-up    MRI lumbar spine review    HPI patient returns with ongoing problems with back pain and primarily left more than right symptoms mostly into the buttocks sometimes the thigh.  She has had symptoms for more than a year she states that therapy is giving her some improvement.  She is used ibuprofen with some improvement an MRI scan has been performed which is available for review.  This shows disc protrusions small left L2-3 and L3-4 with borderline foraminal narrowing and borderline lateral recess stenosis L3-4.  No central stenosis.  No bowel bladder symptoms.  She denies fever or chills.  She just finished working 3rd shift at [employer]  update unchanged from last office visit.   Objective: Vital Signs: Ht 5\' 3"  (1.6 m)   Wt 180 lb (81.6 kg)   BMI 31.89 kg/m   Physical Exam Constitutional:      Appearance: She is well-developed.  HENT:     Head: Normocephalic.     Right Ear: External ear normal.     Left Ear: External ear normal.  Eyes:     Pupils: Pupils are equal, round, and reactive to light.  Neck:     Thyroid: No thyromegaly.     Trachea: No tracheal deviation.  Cardiovascular:     Rate and Rhythm: Normal rate.  Pulmonary:     Effort: Pulmonary effort is normal.  Abdominal:     Palpations: Abdomen is soft.  Skin:    General: Skin is warm and dry.  Neurological:     Mental Status: She is alert and oriented to person, place, and time.  Psychiatric:        Behavior: Behavior normal.     Ortho Exam patient is amatory without a limp she is little slow getting from sitting to standing.  Mild discomfort straight leg raising 90 degrees.  Negative logroll of the hips no weakness lower extremities.  Specialty Comments:  No specialty comments available.  Imaging: CLINICAL DATA:  Low  back pain with left leg pain for 6 months  EXAM: MRI LUMBAR SPINE WITHOUT CONTRAST  TECHNIQUE: Multiplanar, multisequence MR imaging of the lumbar spine was performed. No intravenous contrast was administered.  COMPARISON:  X-ray 04/14/2020  FINDINGS: Segmentation: The lowest well developed disc space is designated as L5-S1 with presumed hypoplastic ribs at the T12 segment.  Alignment:  2 mm anterolisthesis L4 on L5.  Vertebrae:  No fracture, evidence of discitis, or bone lesion.  Conus medullaris and cauda equina: Conus extends to the L1 level. Conus and cauda equina appear normal.  Paraspinal and other soft tissues: Negative.  Disc levels:  T12-L1: Unremarkable.  L1-L2: Unremarkable.  L2-L3: Disc desiccation with mild circumferential disc bulge including a small left foraminal protrusion.  Disc mildly displaces the exiting left L2 nerve root without evidence of neural impingement. Mild left foraminal stenosis. No canal stenosis.  L3-L4: Disc desiccation mild circumferential disc bulge including a small left foraminal protrusion. There is borderline-mild left foraminal and subarticular recess narrowing without evidence of neural impingement. No right foraminal or canal stenosis.  L4-L5: Mild disc uncovering without focal disc protrusion. Mild bilateral facet arthropathy. No foraminal or canal stenosis.  L5-S1: Unremarkable disc. Mild bilateral facet arthropathy. No foraminal or canal stenosis.  IMPRESSION: 1. Mild degenerative disc disease at L2-L3 and L3-L4 with small left foraminal protrusions at both levels. 2. Borderline-mild left foraminal and subarticular recess stenosis at L3-4. 3. Mild left foraminal stenosis with slight displacement of the exiting left L2 nerve root at the L2-L3 level. 4. No significant canal stenosis at any level.   Electronically Signed   By: Davina Poke D.O.   On: 06/25/2020 10:50   PMFS History: Patient Active Problem List   Diagnosis Date Noted  . Other intervertebral disc degeneration, lumbar region 04/14/2020  . Obstructive sleep apnea 11/04/2018  . Headache 04/02/2015  . Chronic tension-type headache, intractable 01/26/2015  . Orthostatic hypotension 01/26/2015  . Vitamin D deficiency 12/30/2014  . Migraine 12/25/2014  . Dizziness 12/25/2014  . Tobacco abuse 12/25/2014  . Carpal tunnel syndrome 12/25/2014  . Preventative health care 12/25/2014  . Thyromegaly 11/16/2014  . BPV (benign positional vertigo) 11/16/2014  . Tension headache 11/16/2014  . IBS (irritable bowel syndrome) 11/16/2014   Past Medical History:  Diagnosis Date  . Aneurysm ([facility]) 2020   supraclinoid R int carotid artery  . Chronic tension headaches   . Headache   . Hyperlipidemia    borderline  . Sickle cell trait ([facility])   . Thyroid  disease    enlarged thyroid  . Vertigo   . Vitamin D deficiency     Family History  Problem Relation Age of Onset  . Heart disease Mother        Investment banker, corporate  . Diabetes Mother   . Heart disease Paternal Grandmother        died of CHF  . Diabetes Paternal Grandmother   . Diabetes Brother   . Cancer Neg Hx   . Kidney disease Neg Hx   . Colon cancer Neg Hx   . Colon polyps Neg Hx   . Rectal cancer Neg Hx   . Stomach cancer Neg Hx     Past Surgical History:  Procedure Laterality Date  . FOOT SURGERY Bilateral    Pt states she had bones removed from the sides of her feet and middle toes so she could walk better.  . TONSILLECTOMY  1975  . TUBAL LIGATION     Social History  Occupational History    Comment: Cashier  Tobacco Use  . Smoking status: Current Every Day Smoker    Packs/day: 0.75    Types: Cigarettes  . Smokeless tobacco: Never Used  Substance and Sexual Activity  . Alcohol use: Yes    Comment: occasional  . Drug use: No  . Sexual activity: Not on file

## 2020-06-30 ENCOUNTER — Ambulatory Visit: Payer: No Typology Code available for payment source | Admitting: Physical Therapy

## 2020-07-02 ENCOUNTER — Ambulatory Visit: Payer: No Typology Code available for payment source | Admitting: Family

## 2020-07-12 ENCOUNTER — Ambulatory Visit (INDEPENDENT_AMBULATORY_CARE_PROVIDER_SITE_OTHER): Payer: No Typology Code available for payment source | Admitting: Family

## 2020-07-12 ENCOUNTER — Other Ambulatory Visit: Payer: Self-pay

## 2020-07-12 ENCOUNTER — Other Ambulatory Visit (HOSPITAL_BASED_OUTPATIENT_CLINIC_OR_DEPARTMENT_OTHER): Payer: Self-pay | Admitting: Family

## 2020-07-12 ENCOUNTER — Encounter: Payer: Self-pay | Admitting: Family

## 2020-07-12 VITALS — BP 136/66 | HR 87 | Temp 98.1°F | Resp 16 | Ht 65.0 in | Wt 179.0 lb

## 2020-07-12 DIAGNOSIS — R059 Cough, unspecified: Secondary | ICD-10-CM | POA: Diagnosis not present

## 2020-07-12 DIAGNOSIS — Z720 Tobacco use: Secondary | ICD-10-CM | POA: Diagnosis not present

## 2020-07-12 MED ORDER — FLUCONAZOLE 150 MG PO TABS
150.0000 mg | ORAL_TABLET | ORAL | 0 refills | Status: DC
Start: 2020-07-12 — End: 2020-10-12

## 2020-07-12 MED FILL — FLUCONAZOLE 150 MG TABS: 150 | 3 days supply | Qty: 3 | Fill #0

## 2020-07-12 NOTE — Progress Notes (Signed)
Subjective:    Patient ID: Jaime Harris, female    DOB: 01/21/1956, 64 y.o.   MRN: [id number]  HPI  Patient is a 64 yr old female who presents today for follow up.  Cough- Last visit we advised the patient to resume omeprazole 40mg . She reports that she is only taking about 2 x a week. Cough is essentially unchanged.   Tobacco abuse- on wellbutrin she is smoking 10 cigarettes a day.  Review of Systems    see HPI  Past Medical History:  Diagnosis Date   Aneurysm ([facility]) 2020   supraclinoid R int carotid artery   Chronic tension headaches    Headache    Hyperlipidemia    borderline   Sickle cell trait ([facility])    Thyroid disease    enlarged thyroid   Vertigo    Vitamin D deficiency      Social History   Socioeconomic History   Marital status: Single    Spouse name: Not on file   Number of children: 3   Years of education: Not on file   Highest education level: High school graduate  Occupational History    Comment: Cashier  Tobacco Use   Smoking status: Current Every Day Smoker    Packs/day: 0.75    Types: Cigarettes   Smokeless tobacco: Never Used  Substance and Sexual Activity   Alcohol use: Yes    Comment: occasional   Drug use: No   Sexual activity: Not on file  Other Topics Concern   Not on file  Social History Narrative   Makes eye glasses.     Single   GED   She has 3 children   93- Son Jaime Harris- lives locally- he has one son   49- Daughter Jaime Harris- 7 children (4 of these children live with the pt- she is a primary care giver)   [place]- lives in [place] 1 daughter   Enjoys reading   Caffeine- coffee, 4 cups daily, soda every other day   Social Determinants of Health   Financial Resource Strain:    Difficulty of Paying Living Expenses: Not on file  Food Insecurity:    Worried About Charity fundraiser in the Last Year: Not on file   [employer] of Food in the Last Year: Not on file  Transportation Needs:    Lack of  Transportation (Medical): Not on file   Lack of Transportation (Non-Medical): Not on file  Physical Activity:    Days of Exercise per Week: Not on file   Minutes of Exercise per Session: Not on file  Stress:    Feeling of Stress : Not on file  Social Connections:    Frequency of Communication with Friends and Family: Not on file   Frequency of Social Gatherings with Friends and Family: Not on file   Attends Religious Services: Not on file   Active Member of Clubs or Organizations: Not on file   Attends Archivist Meetings: Not on file   Marital Status: Not on file  Intimate Partner Violence:    Fear of Current or Ex-Partner: Not on file   Emotionally Abused: Not on file   Physically Abused: Not on file   Sexually Abused: Not on file    Past Surgical History:  Procedure Laterality Date   FOOT SURGERY Bilateral    Pt states she had bones removed from the sides of her feet and middle toes so she could walk better.   TONSILLECTOMY  1975   TUBAL LIGATION      Family History  Problem Relation Age of Onset   Heart disease Mother        pacemaker/defibrillator   Diabetes Mother    Heart disease Paternal Grandmother        died of CHF   Diabetes Paternal Grandmother    Diabetes Brother    Cancer Neg Hx    Kidney disease Neg Hx    Colon cancer Neg Hx    Colon polyps Neg Hx    Rectal cancer Neg Hx    Stomach cancer Neg Hx     No Known Allergies  Current Outpatient Medications on File Prior to Visit  Medication Sig Dispense Refill   buPROPion (WELLBUTRIN SR) 100 MG 12 hr tablet Take 1 tablet (100 mg total) by mouth 2 (two) times daily. 60 tablet 2   ibuprofen (ADVIL) 800 MG tablet Take 800 mg by mouth every 6 (six) hours as needed.     meloxicam (MOBIC) 7.5 MG tablet Take 1 tablet (7.5 mg total) by mouth daily as needed for pain. 30 tablet 3   nystatin (MYCOSTATIN/NYSTOP) powder Apply 1 application topically 2 (two) times daily. 30 g  2   omeprazole (PRILOSEC) 40 MG capsule Take 1 capsule (40 mg total) by mouth daily. 30 capsule 3   [DISCONTINUED] gabapentin (NEURONTIN) 100 MG capsule Take 1 capsule (100 mg total) by mouth 3 (three) times daily. 60 capsule 0   No current facility-administered medications on file prior to visit.    BP 136/66 (BP Location: Right Arm, Patient Position: Sitting, Cuff Size: Small)    Pulse 87    Temp 98.1 F (36.7 C) (Oral)    Resp 16    Ht 5\' 5"  (1.651 m)    Wt 179 lb (81.2 kg)    SpO2 100%    BMI 29.79 kg/m    Objective:   Physical Exam Constitutional:      Appearance: She is well-developed.  Cardiovascular:     Rate and Rhythm: Normal rate and regular rhythm.     Heart sounds: Normal heart sounds. No murmur heard.   Pulmonary:     Effort: Pulmonary effort is normal. No respiratory distress.     Breath sounds: Normal breath sounds. No wheezing.  Psychiatric:        Behavior: Behavior normal.        Thought Content: Thought content normal.        Judgment: Judgment normal.           Assessment & Plan:  Tobacco abuse-I encouraged patient to continue Wellbutrin 150 mg and smoking cessation efforts.  Cough-I suspect that this is multifactorial with uncontrolled reflux symptoms as well as smoking. I advised the patient to increase her omeprazole to once daily for the next 2 weeks and to call me if her cough fails to improve. If she has no improvement we will plan referral to pulmonology at that time.  This visit occurred during the SARS-CoV-2 public health emergency.  Safety protocols were in place, including screening questions prior to the visit, additional usage of staff PPE, and extensive cleaning of exam room while observing appropriate contact time as indicated for disinfecting solutions.

## 2020-07-12 NOTE — Patient Instructions (Signed)
Please increase the omeprazole (prilosec) to once every day.  Let me know if your cough is not improved in 2 weeks and we will plan to refer you to the lung doctor.

## 2020-07-30 ENCOUNTER — Ambulatory Visit: Payer: No Typology Code available for payment source | Admitting: Family

## 2020-08-30 ENCOUNTER — Ambulatory Visit: Payer: No Typology Code available for payment source | Admitting: Family

## 2020-09-28 ENCOUNTER — Other Ambulatory Visit: Payer: Self-pay

## 2020-09-28 ENCOUNTER — Telehealth (INDEPENDENT_AMBULATORY_CARE_PROVIDER_SITE_OTHER): Payer: BC Managed Care – PPO | Admitting: Family

## 2020-09-28 DIAGNOSIS — J069 Acute upper respiratory infection, unspecified: Secondary | ICD-10-CM | POA: Diagnosis not present

## 2020-09-28 MED ORDER — AZITHROMYCIN 250 MG PO TABS
ORAL_TABLET | ORAL | 0 refills | Status: DC
Start: 2020-09-28 — End: 2020-10-05

## 2020-09-28 NOTE — Progress Notes (Signed)
Virtual Visit via Video Note  I connected with Jaime Harris on 09/28/20 at 12:40 PM EST by a video enabled telemedicine application and verified that I am speaking with the correct person using two identifiers.  Location: Patient: home Provider: home   I discussed the limitations of evaluation and management by telemedicine and the availability of in person appointments. The patient expressed understanding and agreed to proceed. Only the patient and myself were present for today's video call.   History of Present Illness:  Patient is a 65 yr old female who presents today with several concerns. She reports that on  Last Wednesday(09/23/19), She developed chills, cough and thick mucous.  Mouth is very dry. She denies fever.  She is using OTC nyquil which helps with sleep only.  She is drinking water (about 32 oz a day). She has not been able to find a testing appointment to be tested for COVID. She has had one J and J vaccine only.  Observations/Objective:   Gen: Awake, alert, no acute distress Resp: Breathing is even and non-labored Psych: calm/pleasant demeanor Neuro: Alert and Oriented x 3, + facial symmetry, speech is clear.   Assessment and Plan:  Viral URI with cough- Recommended that she increase her water intake to at least 64 oz, use mucinex as needed to thin secretions and begin azithromycin to cover for bronchitis/pneumonia given her worsening cough symptoms.  I also recommended that she try reaching out to [facility] to schedule an appointment for a covid test at [phone].  She is advised to call if symptoms worsen or if not improved in 3-4 days and to go to the ER if she develops severe/worsening symptoms or shortness of breath. Pt verbalizes understanding.       Follow Up Instructions:    I discussed the assessment and treatment plan with the patient. The patient was provided an opportunity to ask questions and all were answered. The patient agreed with the plan  and demonstrated an understanding of the instructions.   The patient was advised to call back or seek an in-person evaluation if the symptoms worsen or if the condition fails to improve as anticipated.  [name], NP

## 2020-10-04 ENCOUNTER — Telehealth: Payer: Self-pay | Admitting: Family

## 2020-10-04 NOTE — Telephone Encounter (Signed)
Called but no answer, lvm for patient to call back for virtual visit with [facility] tomorrow

## 2020-10-04 NOTE — Telephone Encounter (Signed)
Please offer her a virtual visit with me tomorrow if she would like.

## 2020-10-04 NOTE — Telephone Encounter (Signed)
FYI

## 2020-10-04 NOTE — Telephone Encounter (Signed)
FYI  Patient states her covid test is positive

## 2020-10-05 ENCOUNTER — Telehealth (INDEPENDENT_AMBULATORY_CARE_PROVIDER_SITE_OTHER): Payer: BC Managed Care – PPO | Admitting: Family

## 2020-10-05 DIAGNOSIS — U071 COVID-19: Secondary | ICD-10-CM

## 2020-10-05 MED ORDER — BENZONATATE 100 MG PO CAPS
100.0000 mg | ORAL_CAPSULE | Freq: Three times a day (TID) | ORAL | 0 refills | Status: DC | PRN
Start: 2020-10-05 — End: 2021-01-04

## 2020-10-05 NOTE — Progress Notes (Signed)
Virtual Visit via Video Note  I connected with Jaime Harris on 10/05/20 at  8:40 AM EST by a video enabled telemedicine application and verified that I am speaking with the correct person using two identifiers.  Location: Patient: home Provider: home   I discussed the limitations of evaluation and management by telemedicine and the availability of in person appointments. The patient expressed understanding and agreed to proceed. Only the patient and myself were present for today's video call.   History of Present Illness:  Patient is a 65 yr old female who presents today for 1 week follow up visit for covid-19. She developed aches, nasal congestion, headache on cough on 1/12.  She tested positive for covid on 10/01/20 at [facility]. She had 1 J and J vaccine prior to testing positive. Last visit we treated her with azithromycin.  She still reports chest congestion, but denies SOB.    Overall she reports she reports that she feels like she is improving.  She continues to take mucinex- just complains of the chest congestion and cough.    Observations/Objective:   Gen: Awake, alert, no acute distress Resp: Breathing is even and non-labored Psych: calm/pleasant demeanor Neuro: Alert and Oriented x 3, + facial symmetry, speech is clear.   Assessment and Plan:  COVID-19- improving. Advised pt to continue mucinex for congestion and to use tessalon as needed. Advised her that she is medically cleared to return to work at this time and I provided a letter for her employer. Recommended that she get a pfizer or moderna booster shot in about 2 weeks.   Follow Up Instructions:    I discussed the assessment and treatment plan with the patient. The patient was provided an opportunity to ask questions and all were answered. The patient agreed with the plan and demonstrated an understanding of the instructions.   The patient was advised to call back or seek an in-person evaluation if the symptoms worsen or  if the condition fails to improve as anticipated.  [name], NP

## 2020-10-05 NOTE — Telephone Encounter (Signed)
Was seen this morning °

## 2020-10-12 ENCOUNTER — Other Ambulatory Visit: Payer: Self-pay

## 2020-10-12 ENCOUNTER — Ambulatory Visit (INDEPENDENT_AMBULATORY_CARE_PROVIDER_SITE_OTHER): Payer: BC Managed Care – PPO | Admitting: Family

## 2020-10-12 ENCOUNTER — Encounter: Payer: Self-pay | Admitting: Family

## 2020-10-12 VITALS — BP 140/75 | HR 90 | Temp 97.8°F | Resp 16 | Ht 65.0 in | Wt 177.6 lb

## 2020-10-12 DIAGNOSIS — G4733 Obstructive sleep apnea (adult) (pediatric): Secondary | ICD-10-CM | POA: Diagnosis not present

## 2020-10-12 DIAGNOSIS — K219 Gastro-esophageal reflux disease without esophagitis: Secondary | ICD-10-CM

## 2020-10-12 DIAGNOSIS — E559 Vitamin D deficiency, unspecified: Secondary | ICD-10-CM | POA: Diagnosis not present

## 2020-10-12 DIAGNOSIS — Z720 Tobacco use: Secondary | ICD-10-CM

## 2020-10-12 DIAGNOSIS — Z0000 Encounter for general adult medical examination without abnormal findings: Secondary | ICD-10-CM

## 2020-10-12 NOTE — Patient Instructions (Signed)
Please complete lab work prior to leaving.   

## 2020-10-12 NOTE — Progress Notes (Signed)
Subjective:    Patient ID: Jaime Harris, female    DOB: 12-18-1955, 65 y.o.   MRN: [id number]  HPI  Patient is a 65 yr old female who presents today for follow up.  GERD- maintained on omeprazole 40mg  once daily. Reports well controlled.   Vit D deficiency- no on supplement.   Tobacco abuse-tried wellbutrin SR 100mg  bid x 2 weeks. Did not help her cravings so she stopped. She is smoking 6-7 a day since covid infection. Previously smoking 10  OSA- had mild OSA per sleep study 2019.  Not on cpap, reports that she feels generally well rested in the AM's.  Fungal rash- in the groin- she reports that this resolved with diflucan.     BP Readings from Last 3 Encounters:  10/12/20 (!) 152/80  07/12/20 136/66  06/14/20 (!) 141/77   She plans to get her covid booster.    Review of Systems See HPI  Past Medical History:  Diagnosis Date  . Aneurysm ([facility]) 2020   supraclinoid R int carotid artery  . Chronic tension headaches   . Headache   . Hyperlipidemia    borderline  . Sickle cell trait ([facility])   . Thyroid disease    enlarged thyroid  . Vertigo   . Vitamin D deficiency      Social History   Socioeconomic History  . Marital status: Single    Spouse name: Not on file  . Number of children: 3  . Years of education: Not on file  . Highest education level: High school graduate  Occupational History    Comment: Cashier  Tobacco Use  . Smoking status: Current Every Day Smoker    Packs/day: 0.75    Types: Cigarettes  . Smokeless tobacco: Never Used  Substance and Sexual Activity  . Alcohol use: Yes    Comment: occasional  . Drug use: No  . Sexual activity: Not on file  Other Topics Concern  . Not on file  Social History Narrative   Makes eye glasses.     Single   GED   She has 3 children   21- Son [name]- lives locally- he has one son   18- Daughter [name]- 7 children (4 of these children live with the pt- she is a primary care giver)   [place]- lives  in [place] 1 daughter   Enjoys reading   Caffeine- coffee, 4 cups daily, soda every other day   Social Determinants of Health   Financial Resource Strain: Not on file  Food Insecurity: Not on file  Transportation Needs: Not on file  Physical Activity: Not on file  Stress: Not on file  Social Connections: Not on file  Intimate Partner Violence: Not on file    Past Surgical History:  Procedure Laterality Date  . FOOT SURGERY Bilateral    Pt states she had bones removed from the sides of her feet and middle toes so she could walk better.  . TONSILLECTOMY  1975  . TUBAL LIGATION      Family History  Problem Relation Age of Onset  . Heart disease Mother        Investment banker, corporate  . Diabetes Mother   . Heart disease Paternal Grandmother        died of CHF  . Diabetes Paternal Grandmother   . Diabetes Brother   . Cancer Neg Hx   . Kidney disease Neg Hx   . Colon cancer Neg Hx   . Colon polyps Neg  Hx   . Rectal cancer Neg Hx   . Stomach cancer Neg Hx     No Known Allergies  Current Outpatient Medications on File Prior to Visit  Medication Sig Dispense Refill  . benzonatate (TESSALON) 100 MG capsule Take 1 capsule (100 mg total) by mouth 3 (three) times daily as needed. 20 capsule 0  . ibuprofen (ADVIL) 800 MG tablet Take 800 mg by mouth every 6 (six) hours as needed.    Marland Kitchen omeprazole (PRILOSEC) 40 MG capsule Take 1 capsule (40 mg total) by mouth daily. 30 capsule 3  . [DISCONTINUED] gabapentin (NEURONTIN) 100 MG capsule Take 1 capsule (100 mg total) by mouth 3 (three) times daily. 60 capsule 0   No current facility-administered medications on file prior to visit.    BP (!) 152/80 (BP Location: Right Arm, Patient Position: Sitting, Cuff Size: Small)   Pulse 90   Temp 97.8 F (36.6 C) (Temporal)   Resp 16   Ht 5\' 5"  (1.651 m)   Wt 177 lb 9.6 oz (80.6 kg)   SpO2 100%   BMI 29.55 kg/m       Objective:   Physical Exam Constitutional:      Appearance: She  is well-developed and well-nourished.  Neck:     Thyroid: No thyromegaly.  Cardiovascular:     Rate and Rhythm: Normal rate and regular rhythm.     Heart sounds: Normal heart sounds. No murmur heard.   Pulmonary:     Effort: Pulmonary effort is normal. No respiratory distress.     Breath sounds: Normal breath sounds. No wheezing.  Musculoskeletal:     Cervical back: Neck supple.  Skin:    General: Skin is warm and dry.  Neurological:     Mental Status: She is alert and oriented to person, place, and time.  Psychiatric:        Mood and Affect: Mood and affect normal.        Behavior: Behavior normal.        Thought Content: Thought content normal.        Judgment: Judgment normal.           Assessment & Plan:  GERD- stable on omeprazole 40mg .  Continue same.  Vit D deficiency- Not on supplement. Check vit D level.  Tobacco abuse- no success with wellbutrin. Recommended trial of nicoderm patch 14 mcg/24hr.  Fungal rash- reports resolution following treatment with diflucan.  Elevated blood pressure reading- plan to repeat in 1 month.  This visit occurred during the SARS-CoV-2 public health emergency.  Safety protocols were in place, including screening questions prior to the visit, additional usage of staff PPE, and extensive cleaning of exam room while observing appropriate contact time as indicated for disinfecting solutions.

## 2020-10-13 ENCOUNTER — Telehealth: Payer: Self-pay | Admitting: Family

## 2020-10-13 DIAGNOSIS — K219 Gastro-esophageal reflux disease without esophagitis: Secondary | ICD-10-CM | POA: Insufficient documentation

## 2020-10-13 NOTE — Telephone Encounter (Signed)
Can we please schedule her a nurse visit in 1 month to recheck her blood pressure? It was elevated the day she was here.

## 2020-10-15 LAB — VITAMIN D 1,25 DIHYDROXY
Vitamin D 1, 25 (OH)2 Total: 56 pg/mL (ref 18–72)
Vitamin D2 1, 25 (OH)2: 13 pg/mL
Vitamin D3 1, 25 (OH)2: 43 pg/mL

## 2020-10-15 NOTE — Telephone Encounter (Signed)
Patient was scheduled to come back 11-09-2020

## 2020-11-09 ENCOUNTER — Other Ambulatory Visit: Payer: Self-pay

## 2020-11-09 ENCOUNTER — Ambulatory Visit (INDEPENDENT_AMBULATORY_CARE_PROVIDER_SITE_OTHER): Payer: BC Managed Care – PPO

## 2020-11-09 VITALS — BP 126/68 | HR 78

## 2020-11-09 DIAGNOSIS — R030 Elevated blood-pressure reading, without diagnosis of hypertension: Secondary | ICD-10-CM | POA: Diagnosis not present

## 2020-11-09 NOTE — Progress Notes (Signed)
Pt here for Blood pressure check per Debbrah Alar. Last ov reading was elevated.   Pt currently takes: no bp medications  BP Readings from Last 3 Encounters:  10/12/20 140/75  07/12/20 136/66  06/14/20 (!) 141/77    Pt reports compliance with medication.  BP today @ = 126/68 HR = 78

## 2020-12-17 ENCOUNTER — Ambulatory Visit: Payer: BC Managed Care – PPO | Admitting: Family

## 2021-01-04 ENCOUNTER — Other Ambulatory Visit: Payer: Self-pay

## 2021-01-04 ENCOUNTER — Ambulatory Visit (INDEPENDENT_AMBULATORY_CARE_PROVIDER_SITE_OTHER): Payer: BC Managed Care – PPO | Admitting: Family

## 2021-01-04 VITALS — BP 134/64 | HR 94 | Temp 98.5°F | Resp 18 | Ht 65.0 in | Wt 174.8 lb

## 2021-01-04 DIAGNOSIS — M25511 Pain in right shoulder: Secondary | ICD-10-CM | POA: Insufficient documentation

## 2021-01-04 DIAGNOSIS — M5416 Radiculopathy, lumbar region: Secondary | ICD-10-CM | POA: Diagnosis not present

## 2021-01-04 MED ORDER — METHYLPREDNISOLONE 4 MG PO TBPK
ORAL_TABLET | ORAL | 0 refills | Status: DC
Start: 2021-01-04 — End: 2021-03-02

## 2021-01-04 NOTE — Patient Instructions (Signed)
Please start medrol dose pak. Call if pain worsens or if it is not improved in 2 weeks.

## 2021-01-04 NOTE — Assessment & Plan Note (Signed)
New. Rx with trial of medrol dose pak. She will let me know if symptoms worsen or if symptoms do not improve in 2 weeks.

## 2021-01-04 NOTE — Progress Notes (Signed)
Established Patient Office Visit  Subjective:  Patient ID: Jaime Harris, female    DOB: [birth year]  Age: 65 y.o. MRN: [id number]  CC:  Chief Complaint  Patient presents with  . Hip Pain/ discomfort    Right side x 1 month: more when lying on this side. Even in the shoulder. No tx tried yet. -tlc,rma    HPI Jaime Harris presents with c/o 1 month pain in the right leg.  She has associated pain in the lower back.  Pain radiates down the right anterior thigh and is burning in nature.  Also notes pain in the right shoulder when she brushes her hair.  This has been bothering her for about 1 month. She has used ibuprofen without improvement in her right shoulder pain or her leg pain.   Past Medical History:  Diagnosis Date  . Aneurysm ([place]) 2020   supraclinoid R int carotid artery  . Chronic tension headaches   . Headache   . Hyperlipidemia    borderline  . Sickle cell trait ([place])   . Thyroid disease    enlarged thyroid  . Vertigo   . Vitamin D deficiency     Past Surgical History:  Procedure Laterality Date  . FOOT SURGERY Bilateral    Pt states she had bones removed from the sides of her feet and middle toes so she could walk better.  . TONSILLECTOMY  1975  . TUBAL LIGATION      Family History  Problem Relation Age of Onset  . Heart disease Mother        Investment banker, corporate  . Diabetes Mother   . Heart disease Paternal Grandmother        died of CHF  . Diabetes Paternal Grandmother   . Diabetes Brother   . Cancer Neg Hx   . Kidney disease Neg Hx   . Colon cancer Neg Hx   . Colon polyps Neg Hx   . Rectal cancer Neg Hx   . Stomach cancer Neg Hx     Social History   Socioeconomic History  . Marital status: Single    Spouse name: Not on file  . Number of children: 3  . Years of education: Not on file  . Highest education level: High school graduate  Occupational History    Comment: Cashier  Tobacco Use  . Smoking status: Current Every Day Smoker     Packs/day: 0.75    Types: Cigarettes  . Smokeless tobacco: Never Used  Substance and Sexual Activity  . Alcohol use: Yes    Comment: occasional  . Drug use: No  . Sexual activity: Not on file  Other Topics Concern  . Not on file  Social History Narrative   Makes eye glasses.     Single   GED   She has 3 children   72- Son Jaime Harris- lives locally- he has one son   31- Daughter Jaime Harris- 7 children (4 of these children live with the pt- she is a primary care giver)   [place]- lives in [place] 1 daughter   Enjoys reading   Caffeine- coffee, 4 cups daily, soda every other day   Social Determinants of Health   Financial Resource Strain: Not on file  Food Insecurity: Not on file  Transportation Needs: Not on file  Physical Activity: Not on file  Stress: Not on file  Social Connections: Not on file  Intimate Partner Violence: Not on file    Outpatient Medications Prior to Visit  Medication Sig Dispense Refill  . buPROPion (WELLBUTRIN SR) 100 MG 12 hr tablet TAKE 1 TABLET (100 MG TOTAL) BY MOUTH 2 (TWO) TIMES DAILY. 60 tablet 2  . fluconazole (DIFLUCAN) 150 MG tablet TAKE 1 TABLET (150 MG TOTAL) BY MOUTH ONCE A WEEK. 3 tablet 0  . ibuprofen (ADVIL) 800 MG tablet Take 800 mg by mouth every 6 (six) hours as needed.    Marland Kitchen omeprazole (PRILOSEC) 40 MG capsule Take 1 capsule (40 mg total) by mouth daily. 30 capsule 3  . omeprazole (PRILOSEC) 40 MG capsule TAKE 1 CAPSULE (40 MG TOTAL) BY MOUTH DAILY. 30 capsule 3  . benzonatate (TESSALON) 100 MG capsule Take 1 capsule (100 mg total) by mouth 3 (three) times daily as needed. (Patient not taking: Reported on 01/04/2021) 20 capsule 0   No facility-administered medications prior to visit.    No Known Allergies  ROS Review of Systems    Objective:    Physical Exam Constitutional:      Appearance: She is well-developed.  Cardiovascular:     Rate and Rhythm: Normal rate and regular rhythm.     Heart sounds: Normal heart  sounds. No murmur heard.   Pulmonary:     Effort: Pulmonary effort is normal. No respiratory distress.     Breath sounds: Normal breath sounds. No wheezing.  Musculoskeletal:     Cervical back: Normal.     Thoracic back: Normal.     Lumbar back: Tenderness present.     Comments: Right shoulder is without swelling  Full ROM of right shoulder  Neurological:     Deep Tendon Reflexes:     Reflex Scores:      Patellar reflexes are 3+ on the right side and 2+ on the left side.    Comments: Bilateral LE strength is 5/5  Psychiatric:        Behavior: Behavior normal.        Thought Content: Thought content normal.        Judgment: Judgment normal.     BP 134/64 (BP Location: Right Arm, Patient Position: Sitting, Cuff Size: Normal)   Pulse 94   Temp 98.5 F (36.9 C) (Oral)   Resp 18   Ht 5\' 5"  (1.651 m)   Wt 174 lb 12.8 oz (79.3 kg)   SpO2 99%   BMI 29.09 kg/m  Wt Readings from Last 3 Encounters:  01/04/21 174 lb 12.8 oz (79.3 kg)  10/12/20 177 lb 9.6 oz (80.6 kg)  07/12/20 179 lb (81.2 kg)     Health Maintenance Due  Topic Date Due  . COLONOSCOPY (Pts 45-77yrs Insurance coverage will need to be confirmed)  02/01/2020  . COVID-19 Vaccine (2 - Booster for [employer] series) 03/31/2020  . PAP SMEAR-Modifier  01/15/2021    There are no preventive care reminders to display for this patient.  Lab Results  Component Value Date   TSH 0.91 04/01/2019   Lab Results  Component Value Date   WBC 5.5 04/01/2019   HGB 13.1 04/01/2019   HCT 39.0 04/01/2019   MCV 87.6 04/01/2019   PLT 193.0 04/01/2019   Lab Results  Component Value Date   NA 141 04/01/2019   K 4.1 04/01/2019   CO2 26 04/01/2019   GLUCOSE 79 04/01/2019   BUN 18 04/01/2019   CREATININE 0.92 04/01/2019   BILITOT 0.3 04/01/2019   ALKPHOS 125 (H) 04/01/2019   AST 13 04/01/2019   ALT 11 04/01/2019   PROT 6.5 04/01/2019   ALBUMIN  4.1 04/01/2019   CALCIUM 9.0 04/01/2019   ANIONGAP 9 10/10/2017   GFR 74.59  04/01/2019   Lab Results  Component Value Date   CHOL 209 (H) 04/01/2019   Lab Results  Component Value Date   HDL 45.30 04/01/2019   Lab Results  Component Value Date   LDLCALC 134 (H) 04/01/2019   Lab Results  Component Value Date   TRIG 148.0 04/01/2019   Lab Results  Component Value Date   CHOLHDL 5 04/01/2019   No results found for: HGBA1C    Assessment & Plan:   Problem List Items Addressed This Visit   None     No orders of the defined types were placed in this encounter.   Follow-up: No follow-ups on file.    [name], NP

## 2021-01-04 NOTE — Assessment & Plan Note (Signed)
Suspect OA, will see how she does with the medrol dosepak. If back pain/shoulder pain worsen or fail to improve, would plan referral to orthopedics.

## 2021-01-24 ENCOUNTER — Telehealth: Payer: Self-pay | Admitting: Family

## 2021-01-24 NOTE — Telephone Encounter (Signed)
FMLA paperwork faxed into front office  Placed into [place] folder

## 2021-01-25 NOTE — Telephone Encounter (Signed)
FMLA put in provider's folder

## 2021-01-30 DIAGNOSIS — Z0279 Encounter for issue of other medical certificate: Secondary | ICD-10-CM

## 2021-03-02 ENCOUNTER — Other Ambulatory Visit (HOSPITAL_BASED_OUTPATIENT_CLINIC_OR_DEPARTMENT_OTHER): Payer: Self-pay

## 2021-03-02 ENCOUNTER — Ambulatory Visit (INDEPENDENT_AMBULATORY_CARE_PROVIDER_SITE_OTHER): Payer: BC Managed Care – PPO | Admitting: Family

## 2021-03-02 ENCOUNTER — Other Ambulatory Visit: Payer: Self-pay

## 2021-03-02 VITALS — BP 132/82 | HR 84 | Temp 98.4°F | Resp 16 | Wt 179.0 lb

## 2021-03-02 DIAGNOSIS — I872 Venous insufficiency (chronic) (peripheral): Secondary | ICD-10-CM

## 2021-03-02 DIAGNOSIS — G43909 Migraine, unspecified, not intractable, without status migrainosus: Secondary | ICD-10-CM

## 2021-03-02 DIAGNOSIS — M5416 Radiculopathy, lumbar region: Secondary | ICD-10-CM

## 2021-03-02 MED ORDER — KETOROLAC TROMETHAMINE 60 MG/2ML IM SOLN
60.0000 mg | Freq: Once | INTRAMUSCULAR | Status: AC
  Administered 2021-03-02: 60 mg via INTRAMUSCULAR

## 2021-03-02 MED ORDER — SUMATRIPTAN SUCCINATE 50 MG PO TABS
ORAL_TABLET | ORAL | 1 refills | Status: DC
Start: 2021-03-02 — End: 2021-03-08

## 2021-03-02 MED ORDER — MELOXICAM 7.5 MG PO TABS
7.5000 mg | ORAL_TABLET | Freq: Every day | ORAL | 0 refills | Status: DC
Start: 2021-03-02 — End: 2021-04-12

## 2021-03-02 NOTE — Patient Instructions (Signed)
For migraine- you may use imitrex as needed.  For leg pain- please begin meloxicam (anti-inflammatory) once daily.  Migraine Headache A migraine headache is an intense, throbbing pain on one side or both sides of the head. Migraine headaches may also cause other symptoms, such as nausea, vomiting, and sensitivity to light and noise. A migraine headache can last from 4 hours to 3 days. Talk with your doctor about what things may bring on (trigger) your migraine headaches. What are the causes? The exact cause of this condition is not known. However, a migraine may be caused when nerves in the brain become irritated and release chemicals that cause inflammation of blood vessels. This inflammation causes pain. This condition may be triggered or caused by: Drinking alcohol. Smoking. Taking medicines, such as: Medicine used to treat chest pain (nitroglycerin). Birth control pills. Estrogen. Certain blood pressure medicines. Eating or drinking products that contain nitrates, glutamate, aspartame, or tyramine. Aged cheeses, chocolate, or caffeine may also be triggers. Doing physical activity. Other things that may trigger a migraine headache include: Menstruation. Pregnancy. Hunger. Stress. Lack of sleep or too much sleep. Weather changes. Fatigue. What increases the risk? The following factors may make you more likely to experience migraine headaches: Being a certain age. This condition is more common in people who are 84-90 years old. Being female. Having a family history of migraine headaches. Being Caucasian. Having a mental health condition, such as depression or anxiety. Being obese. What are the signs or symptoms? The main symptom of this condition is pulsating or throbbing pain. This pain may: Happen in any area of the head, such as on one side or both sides. Interfere with daily activities. Get worse with physical activity. Get worse with exposure to bright lights or loud  noises. Other symptoms may include: Nausea. Vomiting. Dizziness. General sensitivity to bright lights, loud noises, or smells. Before you get a migraine headache, you may get warning signs (an aura). An aura may include: Seeing flashing lights or having blind spots. Seeing bright spots, halos, or zigzag lines. Having tunnel vision or blurred vision. Having numbness or a tingling feeling. Having trouble talking. Having muscle weakness. Some people have symptoms after a migraine headache (postdromal phase), such as: Feeling tired. Difficulty concentrating. How is this diagnosed? A migraine headache can be diagnosed based on: Your symptoms. A physical exam. Tests, such as: CT scan or an MRI of the head. These imaging tests can help rule out other causes of headaches. Taking fluid from the spine (lumbar puncture) and analyzing it (cerebrospinal fluid analysis, or CSF analysis). How is this treated? This condition may be treated with medicines that: Relieve pain. Relieve nausea. Prevent migraine headaches. Treatment for this condition may also include: Acupuncture. Lifestyle changes like avoiding foods that trigger migraine headaches. Biofeedback. Cognitive behavioral therapy. Follow these instructions at home: Medicines Take over-the-counter and prescription medicines only as told by your health care provider. Ask your health care provider if the medicine prescribed to you: Requires you to avoid driving or using heavy machinery. Can cause constipation. You may need to take these actions to prevent or treat constipation: Drink enough fluid to keep your urine pale yellow. Take over-the-counter or prescription medicines. Eat foods that are high in fiber, such as beans, whole grains, and fresh fruits and vegetables. Limit foods that are high in fat and processed sugars, such as fried or sweet foods. Lifestyle Do not drink alcohol. Do not use any products that contain nicotine or  tobacco, such as  cigarettes, e-cigarettes, and chewing tobacco. If you need help quitting, ask your health care provider. Get at least 8 hours of sleep every night. Find ways to manage stress, such as meditation, deep breathing, or yoga. General instructions     Keep a journal to find out what may trigger your migraine headaches. For example, write down: What you eat and drink. How much sleep you get. Any change to your diet or medicines. If you have a migraine headache: Avoid things that make your symptoms worse, such as bright lights. It may help to lie down in a dark, quiet room. Do not drive or use heavy machinery. Ask your health care provider what activities are safe for you while you are experiencing symptoms. Keep all follow-up visits as told by your health care provider. This is important. Contact a health care provider if: You develop symptoms that are different or more severe than your usual migraine headache symptoms. You have more than 15 headache days in one month. Get help right away if: Your migraine headache becomes severe. Your migraine headache lasts longer than 72 hours. You have a fever. You have a stiff neck. You have vision loss. Your muscles feel weak or like you cannot control them. You start to lose your balance often. You have trouble walking. You faint. You have a seizure. Summary A migraine headache is an intense, throbbing pain on one side or both sides of the head. Migraines may also cause other symptoms, such as nausea, vomiting, and sensitivity to light and noise. This condition may be treated with medicines and lifestyle changes. You may also need to avoid certain things that trigger a migraine headache. Keep a journal to find out what may trigger your migraine headaches. Contact your health care provider if you have more than 15 headache days in a month or you develop symptoms that are different or more severe than your usual migraine headache  symptoms. This information is not intended to replace advice given to you by your health care provider. Make sure you discuss any questions you have with your healthcare provider. Document Revised: 12/20/2018 Document Reviewed: 10/10/2018 Elsevier Patient Education  [place].

## 2021-03-02 NOTE — Progress Notes (Signed)
Subjective:   By signing my name below, I, Jaime Harris, attest that this documentation has been prepared under the direction and in the presence of Jaime Alar NP. 03/02/2021      Patient ID: Jaime Harris, female    DOB: 10-01-1955, 65 y.o.   MRN: [id number]  Chief Complaint  Patient presents with   Leg Swelling    Complains of left leg swelling and pain   Joint Swelling    Complains of right ankle swelling   Headache    Complains of headache for 3 days    HPI Patient is in today for a office visit. She complains of a constant 9/10 painful headache for the past 2 days. The pain is mainly focused on her frontal area. It is agitated from loud noises and bright lights. She tried tylenol and ibuprofen and finds no relief. Her headache is still present after going to sleep. She also has mild nausea with the headaches. She has no prior history of migraines.   She also complains of burning left leg pain which radiates in the the left shin. She also notes mild numbness with the leg pain. She also reports leg swelling in both legs. She has had no prior history of these symptoms. She is concerned about and"indent" on her right anterior thigh. She has lower back pain as well at this time. The pain radiates down her buttocks.  She does not wear compression socks at this time. She is concerned about some darkening of the skin on her shins.   Health Maintenance Due  Topic Date Due   Pneumococcal Vaccine 16-44 Years old (1 - PCV) Never done   Zoster Vaccines- Shingrix (1 of 2) Never done   COLONOSCOPY (Pts 45-30yrs Insurance coverage will need to be confirmed)  02/01/2020   COVID-19 Vaccine (2 - Booster for [employer] series) 03/31/2020   PAP SMEAR-Modifier  01/15/2021    Past Medical History:  Diagnosis Date   Aneurysm ([place]) 2020   supraclinoid R int carotid artery   Chronic tension headaches    Headache    Hyperlipidemia    borderline   Sickle cell trait ([place])    Thyroid disease     enlarged thyroid   Vertigo    Vitamin D deficiency     Past Surgical History:  Procedure Laterality Date   FOOT SURGERY Bilateral    Pt states she had bones removed from the sides of her feet and middle toes so she could walk better.   TONSILLECTOMY  1975   TUBAL LIGATION      Family History  Problem Relation Age of Onset   Heart disease Mother        pacemaker/defibrillator   Diabetes Mother    Heart disease Paternal Grandmother        died of CHF   Diabetes Paternal Grandmother    Diabetes Brother    Cancer Neg Hx    Kidney disease Neg Hx    Colon cancer Neg Hx    Colon polyps Neg Hx    Rectal cancer Neg Hx    Stomach cancer Neg Hx     Social History   Socioeconomic History   Marital status: Single    Spouse name: Not on file   Number of children: 3   Years of education: Not on file   Highest education level: High school graduate  Occupational History    Comment: Cashier  Tobacco Use   Smoking status: Every Day  Packs/day: 0.75    Pack years: 0.00    Types: Cigarettes   Smokeless tobacco: Never  Substance and Sexual Activity   Alcohol use: Yes    Comment: occasional   Drug use: No   Sexual activity: Not on file  Other Topics Concern   Not on file  Social History Narrative   Makes eye glasses.     Single   GED   She has 3 children   81- Son Jaime Harris- lives locally- he has one son   15- Daughter Jaime Harris- 7 children (4 of these children live with the pt- she is a primary care giver)   [place]- lives in [place] 1 daughter   Enjoys reading   Caffeine- coffee, 4 cups daily, soda every other day   Social Determinants of Health   Financial Resource Strain: Not on file  Food Insecurity: Not on file  Transportation Needs: Not on file  Physical Activity: Not on file  Stress: Not on file  Social Connections: Not on file  Intimate Partner Violence: Not on file    Outpatient Medications Prior to Visit  Medication Sig Dispense Refill    Multiple Vitamin (MULTIVITAMIN) tablet Take 1 tablet by mouth daily.     buPROPion (WELLBUTRIN SR) 100 MG 12 hr tablet TAKE 1 TABLET (100 MG TOTAL) BY MOUTH 2 (TWO) TIMES DAILY. 60 tablet 2   fluconazole (DIFLUCAN) 150 MG tablet TAKE 1 TABLET (150 MG TOTAL) BY MOUTH ONCE A WEEK. 3 tablet 0   methylPREDNISolone (MEDROL DOSEPAK) 4 MG TBPK tablet Take by mouth per package instructions 21 tablet 0   omeprazole (PRILOSEC) 40 MG capsule Take 1 capsule (40 mg total) by mouth daily. 30 capsule 3   omeprazole (PRILOSEC) 40 MG capsule TAKE 1 CAPSULE (40 MG TOTAL) BY MOUTH DAILY. 30 capsule 3   No facility-administered medications prior to visit.    No Known Allergies  Review of Systems  Eyes:  Positive for photophobia.  Cardiovascular:  Positive for leg swelling (Bilateral ankles).  Gastrointestinal:  Positive for nausea (Mild).  Musculoskeletal:  Positive for myalgias (Burning left leg pain).  Neurological:  Positive for headaches (Constant pain on frontal area).      Objective:    Physical Exam Constitutional:      General: She is not in acute distress.    Appearance: Normal appearance. She is not ill-appearing.  HENT:     Head: Normocephalic and atraumatic.     Right Ear: External ear normal.     Left Ear: External ear normal.  Eyes:     Extraocular Movements: Extraocular movements intact.     Pupils: Pupils are equal, round, and reactive to light.  Cardiovascular:     Rate and Rhythm: Normal rate and regular rhythm.     Pulses: Normal pulses.     Heart sounds: Normal heart sounds. No murmur heard.   No gallop.     Comments: Tender spider veins on left shin Right indentation on right thigh without tenderness Pulmonary:     Effort: Pulmonary effort is normal. No respiratory distress.     Breath sounds: Normal breath sounds. No wheezing, rhonchi or rales.  Musculoskeletal:     Right lower leg: 1+ Edema present.     Left lower leg: 1+ Edema present.  Skin:    General: Skin is  warm and dry.     Comments: Some mild darkening of skin on bilateral shins  Neurological:     Mental Status: She is  alert and oriented to person, place, and time.  Psychiatric:        Behavior: Behavior normal.    BP 132/82 (BP Location: Right Arm, Patient Position: Sitting, Cuff Size: Small)   Pulse 84   Temp 98.4 F (36.9 C) (Oral)   Resp 16   Wt 179 lb (81.2 kg)   SpO2 99%   BMI 29.79 kg/m  Wt Readings from Last 3 Encounters:  03/02/21 179 lb (81.2 kg)  01/04/21 174 lb 12.8 oz (79.3 kg)  10/12/20 177 lb 9.6 oz (80.6 kg)       Assessment & Plan:   Problem List Items Addressed This Visit       Unprioritized   Venous insufficiency    Swelling, spider veins, hyperpigmentation of shins all consistent with chronic venous insufficiency. Recommended that she try using compression hose and elevate legs as able.        Migraine - Primary    New. Pt given toradol 60mg  IM today in the office. She will also be sent home with rx for prn imitrex 50mg .        Relevant Medications   SUMAtriptan (IMITREX) 50 MG tablet   meloxicam (MOBIC) 7.5 MG tablet   Lumbar radiculopathy    Trial of prn meloxicam 7.5mg .  If symptoms worsen or fail to improve, consider PT evaluation.         Meds ordered this encounter  Medications   SUMAtriptan (IMITREX) 50 MG tablet    Sig: May repeat in 2 hours if headache persists or recurs (max 2 tabs/24 hrs)    Dispense:  10 tablet    Refill:  1    Order Specific Question:   Supervising Provider    Answer:   Penni Homans A [[id number]]   meloxicam (MOBIC) 7.5 MG tablet    Sig: Take 1 tablet (7.5 mg total) by mouth daily.    Dispense:  14 tablet    Refill:  0    Order Specific Question:   Supervising Provider    Answer:   Penni Homans A [[id number]]   ketorolac (TORADOL) injection 60 mg    I, Jaime Alar NP, personally preformed the services described in this documentation.  All medical record entries made by the scribe were at my direction  and in my presence.  I have reviewed the chart and discharge instructions (if applicable) and agree that the record reflects my personal performance and is accurate and complete. 03/02/2021   I,Jaime Harris,acting as a Education administrator for [name], NP.,have documented all relevant documentation on the behalf of [name], NP,as directed by  [name], NP while in the presence of [name], NP.   [name], NP

## 2021-03-03 DIAGNOSIS — I872 Venous insufficiency (chronic) (peripheral): Secondary | ICD-10-CM | POA: Insufficient documentation

## 2021-03-03 NOTE — Assessment & Plan Note (Signed)
Swelling, spider veins, hyperpigmentation of shins all consistent with chronic venous insufficiency. Recommended that she try using compression hose and elevate legs as able.

## 2021-03-03 NOTE — Assessment & Plan Note (Signed)
Trial of prn meloxicam 7.5mg .  If symptoms worsen or fail to improve, consider PT evaluation.

## 2021-03-03 NOTE — Assessment & Plan Note (Signed)
New. Pt given toradol 60mg  IM today in the office. She will also be sent home with rx for prn imitrex 50mg .

## 2021-03-07 ENCOUNTER — Telehealth: Payer: Self-pay

## 2021-03-07 NOTE — Telephone Encounter (Signed)
Pt called stating the Sumatriptan she was prescribed made her sick and she needs something else in place of it.  Pt stated she is still having headache symptoms.

## 2021-03-08 MED ORDER — RIZATRIPTAN BENZOATE 5 MG PO TABS
5.0000 mg | ORAL_TABLET | ORAL | 0 refills | Status: DC | PRN
Start: 2021-03-08 — End: 2021-04-05

## 2021-03-08 NOTE — Telephone Encounter (Signed)
Rx sent for maxalt instead.  She may have similar side effects with this medication. Just let me know if she does not tolerate.

## 2021-03-08 NOTE — Telephone Encounter (Signed)
Patient advised of rx change.

## 2021-03-23 DIAGNOSIS — Z20822 Contact with and (suspected) exposure to covid-19: Secondary | ICD-10-CM | POA: Diagnosis not present

## 2021-03-24 ENCOUNTER — Other Ambulatory Visit: Payer: Self-pay | Admitting: Family

## 2021-03-24 DIAGNOSIS — Z1231 Encounter for screening mammogram for malignant neoplasm of breast: Secondary | ICD-10-CM

## 2021-04-05 ENCOUNTER — Other Ambulatory Visit: Payer: Self-pay | Admitting: Family

## 2021-04-12 ENCOUNTER — Other Ambulatory Visit: Payer: Self-pay

## 2021-04-12 ENCOUNTER — Encounter: Payer: Self-pay | Admitting: Family

## 2021-04-12 ENCOUNTER — Other Ambulatory Visit (HOSPITAL_COMMUNITY)
Admission: RE | Admit: 2021-04-12 | Discharge: 2021-04-12 | Disposition: A | Discharge status: Home / Self Care | Payer: BC Managed Care – PPO | Source: Ambulatory Visit | Attending: Family | Admitting: Family

## 2021-04-12 ENCOUNTER — Other Ambulatory Visit (HOSPITAL_BASED_OUTPATIENT_CLINIC_OR_DEPARTMENT_OTHER): Payer: Self-pay

## 2021-04-12 ENCOUNTER — Ambulatory Visit (INDEPENDENT_AMBULATORY_CARE_PROVIDER_SITE_OTHER): Payer: BC Managed Care – PPO | Admitting: Family

## 2021-04-12 VITALS — BP 141/66 | HR 99 | Temp 98.5°F | Resp 16 | Ht 65.0 in | Wt 183.0 lb

## 2021-04-12 DIAGNOSIS — E785 Hyperlipidemia, unspecified: Secondary | ICD-10-CM

## 2021-04-12 DIAGNOSIS — N3281 Overactive bladder: Secondary | ICD-10-CM

## 2021-04-12 DIAGNOSIS — Z01419 Encounter for gynecological examination (general) (routine) without abnormal findings: Secondary | ICD-10-CM | POA: Diagnosis not present

## 2021-04-12 DIAGNOSIS — Z1272 Encounter for screening for malignant neoplasm of vagina: Secondary | ICD-10-CM | POA: Diagnosis not present

## 2021-04-12 DIAGNOSIS — R350 Frequency of micturition: Secondary | ICD-10-CM | POA: Diagnosis not present

## 2021-04-12 DIAGNOSIS — Z0000 Encounter for general adult medical examination without abnormal findings: Secondary | ICD-10-CM

## 2021-04-12 DIAGNOSIS — Z720 Tobacco use: Secondary | ICD-10-CM

## 2021-04-12 DIAGNOSIS — R519 Headache, unspecified: Secondary | ICD-10-CM

## 2021-04-12 LAB — COMPREHENSIVE METABOLIC PANEL
ALT: 11 U/L (ref 0–35)
AST: 12 U/L (ref 0–37)
Albumin: 4 g/dL (ref 3.5–5.2)
Alkaline Phosphatase: 136 U/L — ABNORMAL HIGH (ref 39–117)
BUN: 14 mg/dL (ref 6–23)
CO2: 27 mEq/L (ref 19–32)
Calcium: 9.3 mg/dL (ref 8.4–10.5)
Chloride: 105 mEq/L (ref 96–112)
Creatinine, Ser: 0.89 mg/dL (ref 0.40–1.20)
GFR: 68.2 mL/min (ref >60.00)
Glucose, Bld: 82 mg/dL (ref 70–99)
Potassium: 4 mEq/L (ref 3.5–5.1)
Sodium: 140 mEq/L (ref 135–145)
Total Bilirubin: 0.3 mg/dL (ref 0.2–1.2)
Total Protein: 6.8 g/dL (ref 6.0–8.3)

## 2021-04-12 LAB — LIPID PANEL
Cholesterol: 213 mg/dL — ABNORMAL HIGH (ref 0–200)
HDL: 44.5 mg/dL (ref >39.00)
NonHDL: 168.41
Total CHOL/HDL Ratio: 5
Triglycerides: 206 mg/dL — ABNORMAL HIGH (ref 0.0–149.0)
VLDL: 41.2 mg/dL — ABNORMAL HIGH (ref 0.0–40.0)

## 2021-04-12 LAB — LDL CHOLESTEROL, DIRECT: Direct LDL: 132 mg/dL

## 2021-04-12 LAB — URINALYSIS, ROUTINE W REFLEX MICROSCOPIC
Bilirubin Urine: NEGATIVE
Ketones, ur: NEGATIVE
Leukocytes,Ua: NEGATIVE
Nitrite: NEGATIVE
Specific Gravity, Urine: <=1.005 — AB (ref 1.000–1.030)
Total Protein, Urine: NEGATIVE
Urine Glucose: NEGATIVE
Urobilinogen, UA: 0.2 (ref 0.0–1.0)
pH: 6 (ref 5.0–8.0)

## 2021-04-12 MED ORDER — OXYBUTYNIN CHLORIDE ER 5 MG PO TB24
5.0000 mg | ORAL_TABLET | Freq: Every day | ORAL | 3 refills | Status: DC
  Filled 2021-04-12 – 2021-04-25 (×2): qty 30, 30d supply, fill #0

## 2021-04-12 NOTE — Assessment & Plan Note (Signed)
New. Trial of ditropan xl.

## 2021-04-12 NOTE — Progress Notes (Signed)
Subjective:     Patient ID: Jaime Harris, female    DOB: Apr 29, 1956, 65 y.o.   MRN: [id number]  Chief Complaint  Patient presents with  . Annual Exam    HPI Patient is in today for cpx.  Patient presents today for complete physical.  Immunizations: J and J x 1, Tdap 2016, declines shingrix Diet: needs improvement Wt Readings from Last 3 Encounters:  04/12/21 183 lb (83 kg)  03/02/21 179 lb (81.2 kg)  01/04/21 174 lb 12.8 oz (79.3 kg)  Exercise:  not regularly Colonoscopy: due 01/2022 Dexa: normal 2020 Pap Smear: 01/2018 Mammogram: scheduled Dental: dentures Vision:  up to date  Headaches- notes that she sometimes has posterior headaches, also has with sexual intercourse and when she is in the shower.    Tobacco abuse- wellbutrin helped a little bit.   10-15 cigarettes a day.  Health Maintenance Due  Topic Date Due  . Zoster Vaccines- Shingrix (1 of 2) Never done  . COVID-19 Vaccine (2 - Booster for [employer] series) 03/31/2020  . PAP SMEAR-Modifier  01/15/2021  . PNA vac Low Risk Adult (1 of 2 - PCV13) 03/18/2021  . INFLUENZA VACCINE  04/11/2021    Past Medical History:  Diagnosis Date  . Aneurysm ([place]) 2020   supraclinoid R int carotid artery  . Chronic tension headaches   . Headache   . Hyperlipidemia    borderline  . Sickle cell trait ([place])   . Thyroid disease    enlarged thyroid  . Vertigo   . Vitamin D deficiency     Past Surgical History:  Procedure Laterality Date  . FOOT SURGERY Bilateral    Pt states she had bones removed from the sides of her feet and middle toes so she could walk better.  . TONSILLECTOMY  1975  . TUBAL LIGATION      Family History  Problem Relation Age of Onset  . Heart disease Mother        Investment banker, corporate  . Diabetes Mother   . Heart disease Paternal Grandmother        died of CHF  . Diabetes Paternal Grandmother   . Diabetes Brother   . Cancer Neg Hx   . Kidney disease Neg Hx   . Colon cancer Neg Hx    . Colon polyps Neg Hx   . Rectal cancer Neg Hx   . Stomach cancer Neg Hx     Social History   Socioeconomic History  . Marital status: Single    Spouse name: Not on file  . Number of children: 3  . Years of education: Not on file  . Highest education level: High school graduate  Occupational History    Comment: Cashier  Tobacco Use  . Smoking status: Every Day    Packs/day: 0.75    Types: Cigarettes  . Smokeless tobacco: Never  Substance and Sexual Activity  . Alcohol use: Yes    Comment: occasional  . Drug use: No  . Sexual activity: Not on file  Other Topics Concern  . Not on file  Social History Narrative   Makes eye glasses.     Single   GED   She has 3 children   19- Son [name]- lives locally- he has one son   21- Daughter [name]- 7 children (4 of these children live with the pt- she is a primary care giver)   [place]- lives in [place] 1 daughter   Enjoys reading   Caffeine-  coffee, 4 cups daily, soda every other day   Social Determinants of Health   Financial Resource Strain: Not on file  Food Insecurity: Not on file  Transportation Needs: Not on file  Physical Activity: Not on file  Stress: Not on file  Social Connections: Not on file  Intimate Partner Violence: Not on file    Outpatient Medications Prior to Visit  Medication Sig Dispense Refill  . Multiple Vitamin (MULTIVITAMIN) tablet Take 1 tablet by mouth daily.    . rizatriptan (MAXALT) 5 MG tablet Take 5 mg by mouth as needed for migraine. May repeat in 2 hours if needed    . meloxicam (MOBIC) 7.5 MG tablet Take 1 tablet (7.5 mg total) by mouth daily. 14 tablet 0  . rizatriptan (MAXALT) 5 MG tablet TAKE 1 TABLET BY MOUTH AS NEEDED FOR MIGRAINE. MAY REPEAT IN 2 HOURS IF NEEDED 10 tablet 0   No facility-administered medications prior to visit.    No Known Allergies  Review of Systems  Constitutional:  Negative for diaphoresis.  HENT:  Negative for congestion and hearing loss.    Eyes:  Negative for blurred vision.  Respiratory:  Negative for cough.   Cardiovascular:  Positive for leg swelling.  Gastrointestinal:  Positive for constipation (has tried mag citrate in the past, metamucil). Negative for diarrhea.  Genitourinary:  Positive for frequency. Negative for dysuria.  Musculoskeletal:  Negative for joint pain and myalgias.  Neurological:  Positive for headaches.  Psychiatric/Behavioral:  Negative for depression and memory loss.       Objective:    Physical Exam  BP (!) 141/66 (BP Location: Right Arm, Patient Position: Sitting, Cuff Size: Small)   Pulse 99   Temp 98.5 F (36.9 C) (Oral)   Resp 16   Ht _0  (1.651 m)   Wt 183 lb (83 kg)   SpO2 100%   BMI 30.45 kg/m  Wt Readings from Last 3 Encounters:  04/12/21 183 lb (83 kg)  03/02/21 179 lb (81.2 kg)  01/04/21 174 lb 12.8 oz (79.3 kg)   Physical Exam  Constitutional: She is oriented to person, place, and time. She appears well-developed and well-nourished. No distress.  HENT:  Head: Normocephalic and atraumatic.  Right Ear: Tympanic membrane and ear canal normal.  Left Ear: Tympanic membrane and ear canal normal.  Mouth/Throat: Not examined- pt wearing mask Eyes: Pupils are equal, round, and reactive to light. No scleral icterus.  Neck: Normal range of motion. No thyromegaly present.  Cardiovascular: Normal rate and regular rhythm.   No murmur heard. Pulmonary/Chest: Effort normal and breath sounds normal. No respiratory distress. He has no wheezes. She has no rales. She exhibits no tenderness.  Abdominal: Soft. Bowel sounds are normal. She exhibits no distension and no mass. There is no tenderness. There is no rebound and no guarding.  Musculoskeletal: She exhibits no edema.  Lymphadenopathy:    She has no cervical adenopathy.  Neurological: She is alert and oriented to person, place, and time. She has normal patellar reflexes. She exhibits normal muscle tone. Coordination normal.  Skin:  Skin is warm and dry.  Psychiatric: She has a normal mood and affect. Her behavior is normal. Judgment and thought content normal.  Breasts: Examined lying Right: Without masses, retractions, discharge or axillary adenopathy.  Left: Without masses, retractions, discharge or axillary adenopathy.  Inguinal/mons: Normal without inguinal adenopathy  External genitalia: Normal  BUS/Urethra/Skene's glands: Normal  Bladder: Normal  Vagina: Normal  Cervix: Normal  Uterus: normal in  size, shape and contour. Midline and mobile  Adnexa/parametria:  Rt: Without masses or tenderness.  Lt: Without masses or tenderness.  Anus and perineum: Normal            Assessment & Plan:       Assessment & Plan:   Problem List Items Addressed This Visit       Unprioritized   Tobacco abuse    Recommended trial of nicorette patch- see AVS for details.        Preventative health care   Overactive bladder    New. Trial of ditropan xl.        Relevant Medications   oxybutynin (DITROPAN-XL) 5 MG 24 hr tablet   Headache - Primary   Relevant Medications   rizatriptan (MAXALT) 5 MG tablet   Other Relevant Orders   Ambulatory referral to Neurology   Other Visit Diagnoses     Urinary frequency       Relevant Orders   Urinalysis, Routine w reflex microscopic   Urine Culture   Encounter for Papanicolaou smear of vagina as part of routine gynecological examination       Relevant Orders   Cytology - PAP( [place])   Hyperlipidemia, unspecified hyperlipidemia type       Relevant Orders   Comp Met ([facility])   Lipid panel       I have discontinued Cleon Gosdin's meloxicam. I am also having her start on oxybutynin. Additionally, I am having her maintain her multivitamin and rizatriptan.  Meds ordered this encounter  Medications  . oxybutynin (DITROPAN-XL) 5 MG 24 hr tablet    Sig: Take 1 tablet (5 mg total) by mouth at bedtime.    Dispense:  30 tablet    Refill:  3    Order Specific  Question:   Supervising Provider    Answer:   Penni Homans A [[id number]]

## 2021-04-12 NOTE — Assessment & Plan Note (Signed)
Recommended trial of nicorette patch- see AVS for details.

## 2021-04-12 NOTE — Patient Instructions (Addendum)
Please begin Nicotine patch 2mg/24 hrs daily for 4 weeks, then decrease to 14 mcg patch for 4 weeks, then 7 mcg patch for 4 weeks the stop.   Miralax add 1 capful on 8 oz of beverage once daily as needed and drink plenty of water.   Start oxybutynin one tablet at bedtime to help with bladder symptoms.

## 2021-04-13 LAB — URINE CULTURE
MICRO NUMBER:: 12191142
SPECIMEN QUALITY:: ADEQUATE

## 2021-04-13 LAB — CYTOLOGY - PAP
Comment: NEGATIVE
Diagnosis: NEGATIVE
High risk HPV: NEGATIVE

## 2021-04-18 ENCOUNTER — Other Ambulatory Visit (HOSPITAL_BASED_OUTPATIENT_CLINIC_OR_DEPARTMENT_OTHER): Payer: Self-pay

## 2021-04-20 ENCOUNTER — Other Ambulatory Visit (HOSPITAL_BASED_OUTPATIENT_CLINIC_OR_DEPARTMENT_OTHER): Payer: Self-pay

## 2021-04-25 ENCOUNTER — Other Ambulatory Visit (HOSPITAL_BASED_OUTPATIENT_CLINIC_OR_DEPARTMENT_OTHER): Payer: Self-pay

## 2021-05-09 ENCOUNTER — Telehealth: Payer: Self-pay

## 2021-05-09 NOTE — Telephone Encounter (Signed)
Pt called stating her headache medication is not working.  Pt is having daily headaches. Please advise.

## 2021-05-10 NOTE — Telephone Encounter (Signed)
Patient reports they said she needed to pay a $200 dollars debt that she has with them before they can see her. She said she will call them today to try to coordinate payments so she can get appt.

## 2021-05-10 NOTE — Telephone Encounter (Signed)
I think she really needs to see Neurology- was she able to get scheduled with them?

## 2021-05-19 ENCOUNTER — Ambulatory Visit
Admission: RE | Admit: 2021-05-19 | Discharge: 2021-05-19 | Disposition: A | Discharge status: Home / Self Care | Payer: BC Managed Care – PPO | Source: Ambulatory Visit | Attending: Family | Admitting: Family

## 2021-05-19 ENCOUNTER — Other Ambulatory Visit: Payer: Self-pay

## 2021-05-19 DIAGNOSIS — Z1231 Encounter for screening mammogram for malignant neoplasm of breast: Secondary | ICD-10-CM | POA: Diagnosis not present

## 2021-07-05 ENCOUNTER — Telehealth (INDEPENDENT_AMBULATORY_CARE_PROVIDER_SITE_OTHER): Payer: BC Managed Care – PPO | Admitting: Internal Medicine

## 2021-07-05 ENCOUNTER — Encounter: Payer: Self-pay | Admitting: Internal Medicine

## 2021-07-05 VITALS — Ht 65.0 in | Wt 180.0 lb

## 2021-07-05 DIAGNOSIS — J069 Acute upper respiratory infection, unspecified: Secondary | ICD-10-CM

## 2021-07-05 NOTE — Progress Notes (Signed)
Subjective:    Patient ID: Jaime Harris, female    DOB: [birth year], 65 y.o.   MRN: [id number]  DOS:  07/05/2021 Type of visit - description: Virtual Visit via Video Note  I connected with the above patient  by a video enabled telemedicine application and verified that I am speaking with the correct person using two identifiers.   THIS ENCOUNTER IS A VIRTUAL VISIT DUE TO COVID-19 - PATIENT WAS NOT SEEN IN THE OFFICE. PATIENT HAS CONSENTED TO VIRTUAL VISIT / TELEMEDICINE VISIT   Location of patient: home  Location of provider: office  Persons participating in the virtual visit: patient, provider   I discussed the limitations of evaluation and management by telemedicine and the availability of in person appointments. The patient expressed understanding and agreed to proceed.  Acute Symptoms started yesterday: Some cough with clear sputum production, right ear discomfort, "like there is fluid in there".  When asked, admits to subjective fever last night. No nausea or vomiting. Mild myalgia and malaise. No headache + Mild sinus congestion No chest pain no difficulty breathing  Review of Systems See above   Past Medical History:  Diagnosis Date   Aneurysm ([place]) 2020   supraclinoid R int carotid artery   Chronic tension headaches    Headache    Hyperlipidemia    borderline   Sickle cell trait ([facility])    Thyroid disease    enlarged thyroid   Vertigo    Vitamin D deficiency     Past Surgical History:  Procedure Laterality Date   FOOT SURGERY Bilateral    Pt states she had bones removed from the sides of her feet and middle toes so she could walk better.   TONSILLECTOMY  1975   TUBAL LIGATION      Allergies as of 07/05/2021   No Known Allergies      Medication List        Accurate as of July 05, 2021  9:49 AM. If you have any questions, ask your nurse or doctor.          multivitamin tablet Take 1 tablet by mouth daily.   oxybutynin 5 MG 24 hr  tablet Commonly known as: DITROPAN-XL Take 1 tablet (5 mg total) by mouth at bedtime.   rizatriptan 5 MG tablet Commonly known as: MAXALT Take 5 mg by mouth as needed for migraine. May repeat in 2 hours if needed           Objective:   Physical Exam Ht 5\' 5"  (1.651 m)   Wt 180 lb (81.6 kg)   BMI 29.95 kg/m  This is a virtual video visit, she is alert oriented x3, in no distress.  No cough noted.  No vital signs or O2 sats available.    Assessment     65 year old female, PMH includes thyromegaly, OSA, presents with URI: Sxs c/w  URI, she knows the limitations of a virtual visit.  She only had 1 COVID-vaccine before and no flu shot. Recommend conservative treatment with fluids, rest, over-the-counter cough medicines. In addition, I asked her to check for COVID with a home test tomorrow and definitely notify me if it came back +.  She is aware she has a window of opportunity to be treated with antivirals. Definitely call if she is not gradually improving. She verbalized understanding of all of the above.     I discussed the assessment and treatment plan with the patient. The patient was provided an opportunity to ask  questions and all were answered. The patient agreed with the plan and demonstrated an understanding of the instructions.   The patient was advised to call back or seek an in-person evaluation if the symptoms worsen or if the condition fails to improve as anticipated.

## 2021-07-06 ENCOUNTER — Telehealth: Payer: Self-pay | Admitting: Family

## 2021-07-06 NOTE — Telephone Encounter (Signed)
Pt states she had an appointment with Dr. Larose Kells 10/25, but was not prescribed medication bc she had not taken a covid test until after appt. She tested neg 10/25. Pt was wondering about getting prescribed medication before her appt 11/2 for her headache and congestion. Please advise.

## 2021-07-07 NOTE — Telephone Encounter (Signed)
She has a URI. Recommend: Rest, fluids, Tylenol, Robitussin-DM, OTC nasal sprays such as Flonase or Astepro. If she is not gradually better, antibiotics may be indicated in few days.

## 2021-07-08 NOTE — Telephone Encounter (Signed)
Patient advised of provider's comments and work note issued for her to go back to work Sunday evening.

## 2021-07-13 ENCOUNTER — Other Ambulatory Visit: Payer: Self-pay

## 2021-07-13 ENCOUNTER — Encounter: Payer: Self-pay | Admitting: Family

## 2021-07-13 ENCOUNTER — Ambulatory Visit (INDEPENDENT_AMBULATORY_CARE_PROVIDER_SITE_OTHER): Payer: BC Managed Care – PPO | Admitting: Family

## 2021-07-13 VITALS — BP 144/79 | HR 77 | Temp 98.2°F | Resp 16 | Ht 65.0 in | Wt 180.0 lb

## 2021-07-13 DIAGNOSIS — R519 Headache, unspecified: Secondary | ICD-10-CM

## 2021-07-13 DIAGNOSIS — N3281 Overactive bladder: Secondary | ICD-10-CM | POA: Diagnosis not present

## 2021-07-13 DIAGNOSIS — G43809 Other migraine, not intractable, without status migrainosus: Secondary | ICD-10-CM

## 2021-07-13 DIAGNOSIS — G4733 Obstructive sleep apnea (adult) (pediatric): Secondary | ICD-10-CM

## 2021-07-13 DIAGNOSIS — Z23 Encounter for immunization: Secondary | ICD-10-CM | POA: Diagnosis not present

## 2021-07-13 MED ORDER — MIRABEGRON ER 25 MG PO TB24: 25.0000 mg | ORAL_TABLET | Freq: Every day | ORAL | 2 refills | Status: DC

## 2021-07-13 MED ORDER — RIZATRIPTAN BENZOATE 5 MG PO TABS: 5.0000 mg | ORAL_TABLET | ORAL | 3 refills | Status: DC | PRN

## 2021-07-13 NOTE — Progress Notes (Signed)
Subjective:   By signing my name below, I, Jaime Harris, attest that this documentation has been prepared under the direction and in the presence of [name]. 07/13/2021    Patient ID: Jaime Harris, female    DOB: [birth year], 65 y.o.   MRN: [id number]  Chief Complaint  Patient presents with   Migraine    Here for folow up, getting headaches at least once a week     Migraine   Patient is in today for a office visit.   Headaches- She continues having headache at least 2x weekly. The headache is in the front. She typically wakes up with headaches. Stress does not worsen or start her headaches. She has a history of sleep apnea but does not wear a CPAP machine. She occasionally wakes up tired and finds she is always waking up tired when having a headache. She is taking OTC tylenol to manage her headaches and finds mild relief. She has tried taking 5 mg Maxalt to manage her headaches and found slight relief. She is requesting a refill on 5 mg Maxalt. She could not schedule an appointment with a neurologist due to a billing issue. She is interested in receiving a referral to see another neurologist.  Weight- She has not gained nor lost weight since her last sleep study.   Wt Readings from Last 3 Encounters:  07/13/21 180 lb (81.6 kg)  07/05/21 180 lb (81.6 kg)  04/12/21 183 lb (83 kg)   Bladder- She complains of frequency. She reports urinating frequently 5 minutes apart. She also finds she has to urinate after a hard cough or sneeze. She continues taking 5 mg ditropan-AL daily PO at night and only finds mild relief.  Blood pressure- Her blood pressure is doing ok during this visit.   BP Readings from Last 3 Encounters:  07/13/21 (!) 144/79  04/12/21 (!) 141/66  03/02/21 132/82   Pulse Readings from Last 3 Encounters:  07/13/21 77  04/12/21 99  03/02/21 84   Immunizations- She has received the flu vaccine during this visit. She has one [place] and [employer]  vaccine. She was informed of the bivalent Covid-19 vaccine.    Health Maintenance Due  Topic Date Due   Pneumonia Vaccine 74+ Years old (1 - PCV) Never done   Zoster Vaccines- Shingrix (1 of 2) Never done   COVID-19 Vaccine (2 - Booster for [employer] series) 03/31/2020    Past Medical History:  Diagnosis Date   Aneurysm ([place]) 2020   supraclinoid R int carotid artery   Chronic tension headaches    Headache    Hyperlipidemia    borderline   Sickle cell trait ([facility])    Thyroid disease    enlarged thyroid   Vertigo    Vitamin D deficiency     Past Surgical History:  Procedure Laterality Date   FOOT SURGERY Bilateral    Pt states she had bones removed from the sides of her feet and middle toes so she could walk better.   TONSILLECTOMY  1975   TUBAL LIGATION      Family History  Problem Relation Age of Onset   Heart disease Mother        pacemaker/defibrillator   Diabetes Mother    Heart disease Paternal Grandmother        died of CHF   Diabetes Paternal Grandmother    Diabetes Brother    Cancer Neg Hx    Kidney disease Neg Hx  Colon cancer Neg Hx    Colon polyps Neg Hx    Rectal cancer Neg Hx    Stomach cancer Neg Hx     Social History   Socioeconomic History   Marital status: Single    Spouse name: Not on file   Number of children: 3   Years of education: Not on file   Highest education level: High school graduate  Occupational History    Comment: Cashier  Tobacco Use   Smoking status: Every Day    Packs/day: 0.75    Types: Cigarettes   Smokeless tobacco: Never  Substance and Sexual Activity   Alcohol use: Yes    Comment: occasional   Drug use: No   Sexual activity: Not on file  Other Topics Concern   Not on file  Social History Narrative   Makes eye glasses.     Single   GED   She has 3 children   63- Son Jaime Harris- lives locally- he has one son   63- Daughter Jaime Harris- 7 children (4 of these children live with the pt- she is a primary care  giver)   [place]- lives in [place] 1 daughter   Enjoys reading   Caffeine- coffee, 4 cups daily, soda every other day   Social Determinants of Health   Financial Resource Strain: Not on file  Food Insecurity: Not on file  Transportation Needs: Not on file  Physical Activity: Not on file  Stress: Not on file  Social Connections: Not on file  Intimate Partner Violence: Not on file    Outpatient Medications Prior to Visit  Medication Sig Dispense Refill   Multiple Vitamin (MULTIVITAMIN) tablet Take 1 tablet by mouth daily.     oxybutynin (DITROPAN-XL) 5 MG 24 hr tablet Take 1 tablet (5 mg total) by mouth at bedtime. 30 tablet 3   rizatriptan (MAXALT) 5 MG tablet Take 5 mg by mouth as needed for migraine. May repeat in 2 hours if needed     No facility-administered medications prior to visit.    No Known Allergies  Review of Systems  Genitourinary:  Positive for frequency.       (+)stress incontinence  Neurological:  Positive for headaches.      Objective:    Physical Exam Constitutional:      General: She is not in acute distress.    Appearance: Normal appearance. She is not ill-appearing.  HENT:     Head: Normocephalic and atraumatic.     Right Ear: External ear normal.     Left Ear: External ear normal.  Eyes:     Extraocular Movements: Extraocular movements intact.     Pupils: Pupils are equal, round, and reactive to light.  Cardiovascular:     Rate and Rhythm: Normal rate and regular rhythm.     Heart sounds: Normal heart sounds. No murmur heard.   No gallop.  Pulmonary:     Effort: Pulmonary effort is normal. No respiratory distress.     Breath sounds: Normal breath sounds. No wheezing or rales.  Skin:    General: Skin is warm and dry.  Neurological:     Mental Status: She is alert and oriented to person, place, and time.  Psychiatric:        Behavior: Behavior normal.    BP (!) 144/79 (BP Location: Right Arm, Patient Position: Sitting, Cuff  Size: Small)   Pulse 77   Temp 98.2 F (36.8 C) (Oral)   Resp 16  Ht 5\' 5"  (1.651 m)   Wt 180 lb (81.6 kg)   SpO2 100%   BMI 29.95 kg/m  Wt Readings from Last 3 Encounters:  07/13/21 180 lb (81.6 kg)  07/05/21 180 lb (81.6 kg)  04/12/21 183 lb (83 kg)       Assessment & Plan:   Problem List Items Addressed This Visit       Unprioritized   Overactive bladder    No significant improvement with ditropan. Will give trial of myrbetriq.  It sounds like she is also having some stress incontinence and I advised her on Kegel exercises to try. She does not wish to pursue surgical repair.       Obstructive sleep apnea    This was mild on home study 2019.  I think it is unlikely a contributing factor to her headaches as her weight is unchanged since that time.       Migraine    Uncontrolled. Out of maxalt. Will refill. Was unable to schedule follow up with neurology due to bad debt. Will initiate referral to a new neurologys.       Relevant Medications   rizatriptan (MAXALT) 5 MG tablet   Headache   Relevant Medications   rizatriptan (MAXALT) 5 MG tablet   Other Relevant Orders   Ambulatory referral to Neurology   Other Visit Diagnoses     Needs flu shot    -  Primary   Relevant Orders   Flu Vaccine QUAD High Dose(Fluad) (Completed)        Meds ordered this encounter  Medications   rizatriptan (MAXALT) 5 MG tablet    Sig: Take 1 tablet (5 mg total) by mouth as needed for migraine. May repeat in 2 hours if needed    Dispense:  10 tablet    Refill:  3    Order Specific Question:   Supervising Provider    Answer:   Penni Homans A [[id number]]   mirabegron ER (MYRBETRIQ) 25 MG TB24 tablet    Sig: Take 1 tablet (25 mg total) by mouth daily.    Dispense:  30 tablet    Refill:  2    Order Specific Question:   Supervising Provider    Answer:   Penni Homans A [[id number]]    I, [name], personally preformed the services described in this documentation.  All  medical record entries made by the scribe were at my direction and in my presence.  I have reviewed the chart and discharge instructions (if applicable) and agree that the record reflects my personal performance and is accurate and complete. 07/13/2021   I,Jaime Harris,acting as a Education administrator for [name], NP.,have documented all relevant documentation on the behalf of [name], NP,as directed by  [name], NP while in the presence of [name], NP.   [name], NP

## 2021-07-13 NOTE — Assessment & Plan Note (Signed)
This was mild on home study 2019.  I think it is unlikely a contributing factor to her headaches as her weight is unchanged since that time.

## 2021-07-13 NOTE — Assessment & Plan Note (Signed)
Uncontrolled. Out of maxalt. Will refill. Was unable to schedule follow up with neurology due to bad debt. Will initiate referral to a new neurologys.

## 2021-07-13 NOTE — Assessment & Plan Note (Signed)
No significant improvement with ditropan. Will give trial of myrbetriq.  It sounds like she is also having some stress incontinence and I advised her on Kegel exercises to try. She does not wish to pursue surgical repair.

## 2021-07-13 NOTE — Patient Instructions (Signed)
Start myrbetriq for overactive bladder. For headaches, you may continue to use maxalt as needed. We will also work on getting you in to see a new neurologist.

## 2021-10-14 ENCOUNTER — Ambulatory Visit (INDEPENDENT_AMBULATORY_CARE_PROVIDER_SITE_OTHER): Payer: BC Managed Care – PPO | Admitting: Family

## 2021-10-14 VITALS — BP 135/64 | HR 92 | Temp 97.8°F | Resp 16 | Ht 65.0 in | Wt 180.0 lb

## 2021-10-14 DIAGNOSIS — M5416 Radiculopathy, lumbar region: Secondary | ICD-10-CM

## 2021-10-14 DIAGNOSIS — N3281 Overactive bladder: Secondary | ICD-10-CM

## 2021-10-14 DIAGNOSIS — G43909 Migraine, unspecified, not intractable, without status migrainosus: Secondary | ICD-10-CM

## 2021-10-14 DIAGNOSIS — R519 Headache, unspecified: Secondary | ICD-10-CM

## 2021-10-14 NOTE — Assessment & Plan Note (Signed)
Stable/improved. Monitor.

## 2021-10-14 NOTE — Assessment & Plan Note (Signed)
She was unable to get in with [facility] as she had been dismissed. She has been to [facility] but has a balance she believes she can take care of. Will refer back to [facility].

## 2021-10-14 NOTE — Progress Notes (Signed)
Subjective:     Patient ID: Jaime Harris, female    DOB: Jan 17, 1956, 66 y.o.   MRN: [id number]  Chief Complaint  Patient presents with   Follow-up    Here for follow up    HPI  Patient is a 66 yr old female who presents today for follow up.   Low back pain. Reports improved. She is no longer taking gabapent.   Migraines- reports mild migraine this past Monday.  She had 4 migraines and they were relieved by maxalt.    OAB- she continues myrbetric.    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   TONSILLECTOMY  1975   TUBAL LIGATION      Family History  Problem Relation Age of Onset   Heart disease Mother        pacemaker/defibrillator   Diabetes Mother    Heart disease Paternal Grandmother        died of CHF   Diabetes Paternal Grandmother    Diabetes Brother    Cancer Neg Hx    Kidney disease Neg Hx    Colon cancer Neg Hx    Colon polyps Neg Hx    Rectal cancer Neg Hx    Stomach cancer Neg Hx     Social History   Socioeconomic History   Marital status: Single    Spouse name: Not on file   Number of children: 3   Years of education: Not on file   Highest education level: High school graduate  Occupational History    Comment: Scientist, water quality  Tobacco Use   Smoking status: Every Day    Packs/day: 0.75    Types: Cigarettes   Smokeless  tobacco: Never  Substance and Sexual Activity   Alcohol use: Yes    Comment: occasional   Drug use: No   Sexual activity: Not on file  Other Topics Concern   Not on file  Social History Narrative   Makes eye glasses.     Single   GED   She has 3 children   20- Son [name]- lives locally- he has one son   82- Daughter [name]- 7 children (4 of these children live with the pt- she is a primary care giver)   V[place]ay- lives in [place] 1 daughter   Enjoys reading   Caffeine- coffee, 4 cups daily, soda every other day   Social Determinants of Health   Financial Resource Strain: Not on file  Food Insecurity: Not on file  Transportation Needs: Not on file  Physical Activity: Not on file  Stress: Not on file  Social Connections: Not on file  Intimate Partner Violence: Not on file    Outpatient Medications Prior  to Visit  Medication Sig Dispense Refill   mirabegron ER (MYRBETRIQ) 25 MG TB24 tablet Take 1 tablet (25 mg total) by mouth daily. 30 tablet 2   Multiple Vitamin (MULTIVITAMIN) tablet Take 1 tablet by mouth daily.     rizatriptan (MAXALT) 5 MG tablet Take 1 tablet (5 mg total) by mouth as needed for migraine. May repeat in 2 hours if needed 10 tablet 3   No facility-administered medications prior to visit.    No Known Allergies  ROS See HPI    Objective:    Physical Exam Constitutional:      General: She is not in acute distress.    Appearance: Normal appearance. She is well-developed.  HENT:     Head: Normocephalic and atraumatic.     Right Ear: External ear normal.     Left Ear: External ear normal.  Eyes:     General: No scleral icterus. Neck:     Thyroid: No thyromegaly.  Cardiovascular:     Rate and Rhythm: Normal rate and regular rhythm.     Heart sounds: Normal heart sounds. No murmur heard. Pulmonary:     Effort: Pulmonary effort is normal. No respiratory distress.     Breath sounds: Normal breath sounds. No wheezing.  Musculoskeletal:      Cervical back: Neck supple.  Skin:    General: Skin is warm and dry.  Neurological:     Mental Status: She is alert and oriented to person, place, and time.  Psychiatric:        Mood and Affect: Mood normal.        Behavior: Behavior normal.        Thought Content: Thought content normal.        Judgment: Judgment normal.    BP 135/64 (BP Location: Right Arm, Patient Position: Sitting, Cuff Size: Small)    Pulse 92    Temp 97.8 F (36.6 C) (Oral)    Resp 16    Ht 5\' 5"  (1.651 m)    Wt 180 lb (81.6 kg)    SpO2 100%    BMI 29.95 kg/m  Wt Readings from Last 3 Encounters:  10/14/21 180 lb (81.6 kg)  07/13/21 180 lb (81.6 kg)  07/05/21 180 lb (81.6 kg)       Assessment & Plan:   Problem List Items Addressed This Visit       Unprioritized   Overactive bladder    Stable on myrbetriq. Continue same.       Migraine - Primary   Relevant Orders   Ambulatory referral to Neurology   Lumbar radiculopathy    Stable/improved. Monitor.       Headache    She was unable to get in with [facility] as she had been dismissed. She has been to [facility] but has a balance she believes she can take care of. Will refer back to [facility].        I am having Tylea M. Dumas maintain her multivitamin, rizatriptan, and mirabegron ER.  No orders of the defined types were placed in this encounter.

## 2021-10-14 NOTE — Assessment & Plan Note (Signed)
Stable on myrbetriq. Continue same.

## 2021-10-19 DIAGNOSIS — R059 Cough, unspecified: Secondary | ICD-10-CM | POA: Diagnosis not present

## 2021-10-19 DIAGNOSIS — U071 COVID-19: Secondary | ICD-10-CM | POA: Diagnosis not present

## 2021-10-19 DIAGNOSIS — G43909 Migraine, unspecified, not intractable, without status migrainosus: Secondary | ICD-10-CM | POA: Diagnosis not present

## 2021-10-19 DIAGNOSIS — Z20822 Contact with and (suspected) exposure to covid-19: Secondary | ICD-10-CM | POA: Diagnosis not present

## 2021-10-19 DIAGNOSIS — Z6829 Body mass index (BMI) 29.0-29.9, adult: Secondary | ICD-10-CM | POA: Diagnosis not present

## 2021-10-19 DIAGNOSIS — R030 Elevated blood-pressure reading, without diagnosis of hypertension: Secondary | ICD-10-CM | POA: Diagnosis not present

## 2021-10-24 DIAGNOSIS — Z03818 Encounter for observation for suspected exposure to other biological agents ruled out: Secondary | ICD-10-CM | POA: Diagnosis not present

## 2021-10-24 DIAGNOSIS — Z20822 Contact with and (suspected) exposure to covid-19: Secondary | ICD-10-CM | POA: Diagnosis not present

## 2021-12-09 ENCOUNTER — Institutional Professional Consult (permissible substitution): Payer: Self-pay | Admitting: Diagnostic Neuroimaging

## 2022-01-03 ENCOUNTER — Inpatient Hospital Stay (HOSPITAL_BASED_OUTPATIENT_CLINIC_OR_DEPARTMENT_OTHER)
Admission: EM | Admit: 2022-01-03 | Discharge: 2022-01-04 | DRG: 247 | Disposition: A | Discharge status: Home / Self Care | Payer: BC Managed Care – PPO | Attending: Internal Medicine | Admitting: Internal Medicine

## 2022-01-03 ENCOUNTER — Encounter (HOSPITAL_COMMUNITY)
Admission: EM | Disposition: A | Discharge status: Home / Self Care | Payer: Self-pay | Source: Home / Self Care | Attending: Internal Medicine

## 2022-01-03 ENCOUNTER — Telehealth: Payer: Self-pay | Admitting: *Deleted

## 2022-01-03 ENCOUNTER — Emergency Department (HOSPITAL_BASED_OUTPATIENT_CLINIC_OR_DEPARTMENT_OTHER): Payer: BC Managed Care – PPO

## 2022-01-03 ENCOUNTER — Other Ambulatory Visit: Payer: Self-pay

## 2022-01-03 ENCOUNTER — Encounter (HOSPITAL_COMMUNITY): Payer: Self-pay | Admitting: Cardiology

## 2022-01-03 DIAGNOSIS — R1013 Epigastric pain: Secondary | ICD-10-CM | POA: Diagnosis not present

## 2022-01-03 DIAGNOSIS — R112 Nausea with vomiting, unspecified: Secondary | ICD-10-CM

## 2022-01-03 DIAGNOSIS — I214 Non-ST elevation (NSTEMI) myocardial infarction: Principal | ICD-10-CM | POA: Insufficient documentation

## 2022-01-03 DIAGNOSIS — I25110 Atherosclerotic heart disease of native coronary artery with unstable angina pectoris: Secondary | ICD-10-CM | POA: Diagnosis not present

## 2022-01-03 DIAGNOSIS — Z79899 Other long term (current) drug therapy: Secondary | ICD-10-CM

## 2022-01-03 DIAGNOSIS — Z8249 Family history of ischemic heart disease and other diseases of the circulatory system: Secondary | ICD-10-CM

## 2022-01-03 DIAGNOSIS — Z955 Presence of coronary angioplasty implant and graft: Secondary | ICD-10-CM

## 2022-01-03 DIAGNOSIS — E785 Hyperlipidemia, unspecified: Secondary | ICD-10-CM | POA: Insufficient documentation

## 2022-01-03 DIAGNOSIS — I252 Old myocardial infarction: Secondary | ICD-10-CM | POA: Diagnosis present

## 2022-01-03 DIAGNOSIS — I10 Essential (primary) hypertension: Secondary | ICD-10-CM | POA: Diagnosis not present

## 2022-01-03 DIAGNOSIS — I720 Aneurysm of carotid artery: Secondary | ICD-10-CM

## 2022-01-03 DIAGNOSIS — F17210 Nicotine dependence, cigarettes, uncomplicated: Secondary | ICD-10-CM | POA: Diagnosis present

## 2022-01-03 DIAGNOSIS — I2510 Atherosclerotic heart disease of native coronary artery without angina pectoris: Secondary | ICD-10-CM | POA: Diagnosis not present

## 2022-01-03 DIAGNOSIS — I4510 Unspecified right bundle-branch block: Secondary | ICD-10-CM | POA: Diagnosis present

## 2022-01-03 DIAGNOSIS — D573 Sickle-cell trait: Secondary | ICD-10-CM | POA: Diagnosis not present

## 2022-01-03 DIAGNOSIS — R079 Chest pain, unspecified: Secondary | ICD-10-CM | POA: Diagnosis not present

## 2022-01-03 DIAGNOSIS — Z833 Family history of diabetes mellitus: Secondary | ICD-10-CM

## 2022-01-03 DIAGNOSIS — I671 Cerebral aneurysm, nonruptured: Secondary | ICD-10-CM | POA: Diagnosis present

## 2022-01-03 HISTORY — PX: INTRAVASCULAR PRESSURE WIRE/FFR STUDY: CATH118243

## 2022-01-03 HISTORY — PX: LEFT HEART CATH AND CORONARY ANGIOGRAPHY: CATH118249

## 2022-01-03 HISTORY — PX: CORONARY STENT INTERVENTION: CATH118234

## 2022-01-03 HISTORY — DX: Non-ST elevation (NSTEMI) myocardial infarction: I21.4

## 2022-01-03 LAB — COMPREHENSIVE METABOLIC PANEL
ALT: 15 U/L (ref 0–44)
AST: 27 U/L (ref 15–41)
Albumin: 3.9 g/dL (ref 3.5–5.0)
Alkaline Phosphatase: 123 U/L (ref 38–126)
Anion gap: 9 (ref 5–15)
BUN: 12 mg/dL (ref 8–23)
CO2: 24 mmol/L (ref 22–32)
Calcium: 9.3 mg/dL (ref 8.9–10.3)
Chloride: 105 mmol/L (ref 98–111)
Creatinine, Ser: 0.72 mg/dL (ref 0.44–1.00)
GFR, Estimated: >60 mL/min (ref >60)
Glucose, Bld: 122 mg/dL — ABNORMAL HIGH (ref 70–99)
Potassium: 4.1 mmol/L (ref 3.5–5.1)
Sodium: 138 mmol/L (ref 135–145)
Total Bilirubin: 0.3 mg/dL (ref 0.3–1.2)
Total Protein: 7.5 g/dL (ref 6.5–8.1)

## 2022-01-03 LAB — CBC WITH DIFFERENTIAL/PLATELET
Abs Immature Granulocytes: 0.02 10*3/uL (ref 0.00–0.07)
Basophils Absolute: 0 10*3/uL (ref 0.0–0.1)
Basophils Relative: 0 %
Eosinophils Absolute: 0 10*3/uL (ref 0.0–0.5)
Eosinophils Relative: 0 %
HCT: 39.3 % (ref 36.0–46.0)
Hemoglobin: 13.8 g/dL (ref 12.0–15.0)
Immature Granulocytes: 0 %
Lymphocytes Relative: 30 %
Lymphs Abs: 2 10*3/uL (ref 0.7–4.0)
MCH: 29.2 pg (ref 26.0–34.0)
MCHC: 35.1 g/dL (ref 30.0–36.0)
MCV: 83.3 fL (ref 80.0–100.0)
Monocytes Absolute: 0.3 10*3/uL (ref 0.1–1.0)
Monocytes Relative: 5 %
Neutro Abs: 4.4 10*3/uL (ref 1.7–7.7)
Neutrophils Relative %: 65 %
Platelets: 191 10*3/uL (ref 150–400)
RBC: 4.72 MIL/uL (ref 3.87–5.11)
RDW: 14.6 % (ref 11.5–15.5)
WBC: 6.7 10*3/uL (ref 4.0–10.5)
nRBC: 0 % (ref 0.0–0.2)

## 2022-01-03 LAB — URINALYSIS, ROUTINE W REFLEX MICROSCOPIC
Bilirubin Urine: NEGATIVE
Glucose, UA: NEGATIVE mg/dL
Ketones, ur: NEGATIVE mg/dL
Leukocytes,Ua: NEGATIVE
Nitrite: NEGATIVE
Protein, ur: NEGATIVE mg/dL
Specific Gravity, Urine: 1.02 (ref 1.005–1.030)
pH: 8.5 — ABNORMAL HIGH (ref 5.0–8.0)

## 2022-01-03 LAB — TSH: TSH: 0.565 u[IU]/mL (ref 0.350–4.500)

## 2022-01-03 LAB — HIV ANTIBODY (ROUTINE TESTING W REFLEX): HIV Screen 4th Generation wRfx: NONREACTIVE

## 2022-01-03 LAB — POCT ACTIVATED CLOTTING TIME
Activated Clotting Time: 335 seconds
Activated Clotting Time: 347 seconds

## 2022-01-03 LAB — HEMOGLOBIN A1C
Hgb A1c MFr Bld: 6.1 % — ABNORMAL HIGH (ref 4.8–5.6)
Mean Plasma Glucose: 128.37 mg/dL

## 2022-01-03 LAB — TROPONIN I (HIGH SENSITIVITY)
Troponin I (High Sensitivity): 1670 ng/L (ref <18)
Troponin I (High Sensitivity): 3796 ng/L (ref <18)

## 2022-01-03 LAB — URINALYSIS, MICROSCOPIC (REFLEX)

## 2022-01-03 LAB — LIPASE, BLOOD: Lipase: 22 U/L (ref 11–51)

## 2022-01-03 SURGERY — LEFT HEART CATH AND CORONARY ANGIOGRAPHY
Anesthesia: LOCAL

## 2022-01-03 MED ORDER — ACETAMINOPHEN 325 MG PO TABS: 650.0000 mg | ORAL_TABLET | ORAL | Status: DC | PRN

## 2022-01-03 MED ORDER — METOPROLOL TARTRATE 25 MG PO TABS
25.0000 mg | ORAL_TABLET | Freq: Two times a day (BID) | ORAL | Status: DC
Start: 2022-01-03 — End: 2022-01-04
  Administered 2022-01-03 – 2022-01-04 (×2): 25 mg via ORAL
  Filled 2022-01-03 (×2): qty 1

## 2022-01-03 MED ORDER — FENTANYL CITRATE (PF) 100 MCG/2ML IJ SOLN
INTRAMUSCULAR | Status: DC | PRN
  Administered 2022-01-03: 50 ug via INTRAVENOUS

## 2022-01-03 MED ORDER — NITROGLYCERIN IN D5W 200-5 MCG/ML-% IV SOLN
0.0000 ug/min | INTRAVENOUS | Status: DC
  Administered 2022-01-03: 5 ug/min via INTRAVENOUS
  Filled 2022-01-03: qty 250

## 2022-01-03 MED ORDER — HEPARIN SODIUM (PORCINE) 1000 UNIT/ML IJ SOLN
INTRAMUSCULAR | Status: DC | PRN
  Administered 2022-01-03: 4000 [IU] via INTRAVENOUS
  Administered 2022-01-03: 5000 [IU] via INTRAVENOUS

## 2022-01-03 MED ORDER — HYDRALAZINE HCL 20 MG/ML IJ SOLN: 10.0000 mg | INTRAMUSCULAR | Status: AC | PRN

## 2022-01-03 MED ORDER — HEPARIN (PORCINE) IN NACL 1000-0.9 UT/500ML-% IV SOLN
INTRAVENOUS | Status: DC | PRN
  Administered 2022-01-03 (×2): 500 mL

## 2022-01-03 MED ORDER — ONDANSETRON HCL 4 MG/2ML IJ SOLN
INTRAMUSCULAR | Status: AC
  Filled 2022-01-03: qty 2

## 2022-01-03 MED ORDER — ZOLPIDEM TARTRATE 5 MG PO TABS: 5.0000 mg | ORAL_TABLET | Freq: Every evening | ORAL | Status: DC | PRN

## 2022-01-03 MED ORDER — FENTANYL CITRATE (PF) 100 MCG/2ML IJ SOLN
INTRAMUSCULAR | Status: AC
  Filled 2022-01-03: qty 2

## 2022-01-03 MED ORDER — SODIUM CHLORIDE 0.9 % IV SOLN: INTRAVENOUS | Status: AC

## 2022-01-03 MED ORDER — TICAGRELOR 90 MG PO TABS
ORAL_TABLET | ORAL | Status: AC
  Filled 2022-01-03: qty 2

## 2022-01-03 MED ORDER — LACTATED RINGERS IV BOLUS
1000.0000 mL | Freq: Once | INTRAVENOUS | Status: AC
  Administered 2022-01-03: 1000 mL via INTRAVENOUS

## 2022-01-03 MED ORDER — LIDOCAINE VISCOUS HCL 2 % MT SOLN
15.0000 mL | Freq: Once | OROMUCOSAL | Status: AC
  Administered 2022-01-03: 15 mL via ORAL
  Filled 2022-01-03: qty 15

## 2022-01-03 MED ORDER — ASPIRIN EC 81 MG PO TBEC
81.0000 mg | DELAYED_RELEASE_TABLET | Freq: Every day | ORAL | Status: DC
  Administered 2022-01-04: 81 mg via ORAL
  Filled 2022-01-03: qty 1

## 2022-01-03 MED ORDER — ONDANSETRON HCL 4 MG/2ML IJ SOLN
4.0000 mg | Freq: Four times a day (QID) | INTRAMUSCULAR | Status: DC | PRN
  Administered 2022-01-03 – 2022-01-04 (×3): 4 mg via INTRAVENOUS
  Filled 2022-01-03 (×2): qty 2

## 2022-01-03 MED ORDER — NITROGLYCERIN 0.4 MG SL SUBL: 0.4000 mg | SUBLINGUAL_TABLET | SUBLINGUAL | Status: DC | PRN

## 2022-01-03 MED ORDER — HEPARIN BOLUS VIA INFUSION
4000.0000 [IU] | Freq: Once | INTRAVENOUS | Status: AC
  Administered 2022-01-03: 4000 [IU] via INTRAVENOUS

## 2022-01-03 MED ORDER — TICAGRELOR 90 MG PO TABS
ORAL_TABLET | ORAL | Status: DC | PRN
  Administered 2022-01-03: 180 mg via ORAL

## 2022-01-03 MED ORDER — ONDANSETRON HCL 4 MG/2ML IJ SOLN: 4.0000 mg | Freq: Four times a day (QID) | INTRAMUSCULAR | Status: DC | PRN

## 2022-01-03 MED ORDER — SODIUM CHLORIDE 0.9% FLUSH
3.0000 mL | Freq: Two times a day (BID) | INTRAVENOUS | Status: DC
  Administered 2022-01-03 – 2022-01-04 (×2): 3 mL via INTRAVENOUS

## 2022-01-03 MED ORDER — MIDAZOLAM HCL 2 MG/2ML IJ SOLN
INTRAMUSCULAR | Status: AC
  Filled 2022-01-03: qty 2

## 2022-01-03 MED ORDER — LIDOCAINE HCL (PF) 1 % IJ SOLN
INTRAMUSCULAR | Status: DC | PRN
  Administered 2022-01-03: 2 mL

## 2022-01-03 MED ORDER — ATORVASTATIN CALCIUM 80 MG PO TABS
80.0000 mg | ORAL_TABLET | Freq: Every day | ORAL | Status: DC
  Administered 2022-01-03: 80 mg via ORAL
  Filled 2022-01-03: qty 1

## 2022-01-03 MED ORDER — ASPIRIN 81 MG PO CHEW: 81.0000 mg | CHEWABLE_TABLET | Freq: Every day | ORAL | Status: DC

## 2022-01-03 MED ORDER — SODIUM CHLORIDE 0.9% FLUSH: 3.0000 mL | INTRAVENOUS | Status: DC | PRN

## 2022-01-03 MED ORDER — TICAGRELOR 90 MG PO TABS
90.0000 mg | ORAL_TABLET | Freq: Two times a day (BID) | ORAL | Status: DC
  Administered 2022-01-04: 90 mg via ORAL
  Filled 2022-01-03: qty 1

## 2022-01-03 MED ORDER — ASPIRIN 81 MG PO CHEW
324.0000 mg | CHEWABLE_TABLET | Freq: Once | ORAL | Status: AC
  Administered 2022-01-03: 324 mg via ORAL
  Filled 2022-01-03: qty 4

## 2022-01-03 MED ORDER — IOHEXOL 350 MG/ML SOLN
INTRAVENOUS | Status: DC | PRN
Start: 2022-01-03 — End: 2022-01-03
  Administered 2022-01-03: 150 mL

## 2022-01-03 MED ORDER — SODIUM CHLORIDE 0.9 % IV SOLN: 250.0000 mL | INTRAVENOUS | Status: DC | PRN

## 2022-01-03 MED ORDER — MIDAZOLAM HCL 2 MG/2ML IJ SOLN
INTRAMUSCULAR | Status: DC | PRN
  Administered 2022-01-03: 2 mg via INTRAVENOUS

## 2022-01-03 MED ORDER — LIDOCAINE HCL (PF) 1 % IJ SOLN
INTRAMUSCULAR | Status: AC
  Filled 2022-01-03: qty 30

## 2022-01-03 MED ORDER — ONDANSETRON HCL 4 MG/2ML IJ SOLN
4.0000 mg | Freq: Once | INTRAMUSCULAR | Status: AC
  Administered 2022-01-03: 4 mg via INTRAVENOUS
  Filled 2022-01-03: qty 2

## 2022-01-03 MED ORDER — ALPRAZOLAM 0.25 MG PO TABS
0.2500 mg | ORAL_TABLET | Freq: Two times a day (BID) | ORAL | Status: DC | PRN
  Administered 2022-01-03: 0.25 mg via ORAL
  Filled 2022-01-03: qty 1

## 2022-01-03 MED ORDER — VERAPAMIL HCL 2.5 MG/ML IV SOLN
INTRAVENOUS | Status: AC
  Filled 2022-01-03: qty 2

## 2022-01-03 MED ORDER — LABETALOL HCL 5 MG/ML IV SOLN: 10.0000 mg | INTRAVENOUS | Status: AC | PRN

## 2022-01-03 MED ORDER — NITROGLYCERIN 0.4 MG SL SUBL
0.4000 mg | SUBLINGUAL_TABLET | SUBLINGUAL | Status: DC | PRN
  Administered 2022-01-03: 0.4 mg via SUBLINGUAL
  Filled 2022-01-03: qty 1

## 2022-01-03 MED ORDER — SODIUM CHLORIDE 0.9 % IV SOLN: INTRAVENOUS | Status: DC

## 2022-01-03 MED ORDER — HEPARIN SODIUM (PORCINE) 1000 UNIT/ML IJ SOLN
INTRAMUSCULAR | Status: AC
  Filled 2022-01-03: qty 10

## 2022-01-03 MED ORDER — HEPARIN (PORCINE) IN NACL 1000-0.9 UT/500ML-% IV SOLN
INTRAVENOUS | Status: AC
  Filled 2022-01-03: qty 1000

## 2022-01-03 MED ORDER — ALUM & MAG HYDROXIDE-SIMETH 200-200-20 MG/5ML PO SUSP
30.0000 mL | Freq: Once | ORAL | Status: AC
  Administered 2022-01-03: 30 mL via ORAL
  Filled 2022-01-03: qty 30

## 2022-01-03 MED ORDER — VERAPAMIL HCL 2.5 MG/ML IV SOLN
INTRAVENOUS | Status: DC | PRN
  Administered 2022-01-03: 10 mL via INTRA_ARTERIAL

## 2022-01-03 MED ORDER — HEPARIN (PORCINE) 25000 UT/250ML-% IV SOLN
900.0000 [IU]/h | INTRAVENOUS | Status: DC
Start: 2022-01-03 — End: 2022-01-03
  Administered 2022-01-03: 900 [IU]/h via INTRAVENOUS
  Filled 2022-01-03: qty 250

## 2022-01-03 SURGICAL SUPPLY — 21 items
BALL SAPPHIRE NC24 3.25X8 (BALLOONS) ×2
BALLN SAPPHIRE 2.0X12 (BALLOONS) ×2
BALLOON SAPPHIRE 2.0X12 (BALLOONS) IMPLANT
BALLOON SAPPHIRE NC24 3.25X8 (BALLOONS) IMPLANT
CATH 5FR JL3.5 JR4 ANG PIG MP (CATHETERS) ×1 IMPLANT
CATH LAUNCHER 6FR AL.75 (CATHETERS) ×1 IMPLANT
CATH LAUNCHER 6FR JR4 (CATHETERS) ×1 IMPLANT
DEVICE RAD COMP TR BAND LRG (VASCULAR PRODUCTS) ×1 IMPLANT
GLIDESHEATH SLEND SS 6F .021 (SHEATH) ×1 IMPLANT
GUIDEWIRE INQWIRE 1.5J.035X260 (WIRE) IMPLANT
GUIDEWIRE PRESSURE X 175 (WIRE) ×1 IMPLANT
INQWIRE 1.5J .035X260CM (WIRE) ×2
KIT ENCORE 26 ADVANTAGE (KITS) ×1 IMPLANT
KIT ESSENTIALS PG (KITS) ×1 IMPLANT
KIT HEART LEFT (KITS) ×2 IMPLANT
PACK CARDIAC CATHETERIZATION (CUSTOM PROCEDURE TRAY) ×2 IMPLANT
STENT ONYX FRONTIER 3.0X12 (Permanent Stent) ×1 IMPLANT
SYR MEDRAD MARK 7 150ML (SYRINGE) ×2 IMPLANT
TRANSDUCER W/STOPCOCK (MISCELLANEOUS) ×2 IMPLANT
TUBING CIL FLEX 10 FLL-RA (TUBING) ×2 IMPLANT
WIRE COUGAR XT STRL 190CM (WIRE) ×1 IMPLANT

## 2022-01-03 NOTE — ED Notes (Signed)
Report provided to [facility] for transport.  ETA 1315 ?

## 2022-01-03 NOTE — ED Notes (Signed)
[facility] at bedside. Pt stable for transport. 

## 2022-01-03 NOTE — ED Triage Notes (Signed)
Pt states she began having chest pain at 2am last night while she was at work. States its an 8/10 sharp pain. States its in the middle of her chest. Denies any other symptoms.  ?

## 2022-01-03 NOTE — Plan of Care (Signed)
?  Problem: Education: ?Goal: Knowledge of General Education information will improve ?Description: Including pain rating scale, medication(s)/side effects and non-pharmacologic comfort measures ?Outcome: Progressing ?  ?Problem: Health Behavior/Discharge Planning: ?Goal: Ability to manage health-related needs will improve ?Outcome: Progressing ?  ?Problem: Clinical Measurements: ?Goal: Ability to maintain clinical measurements within normal limits will improve ?Outcome: Progressing ?  ?Problem: Clinical Measurements: ?Goal: Cardiovascular complication will be avoided ?Outcome: Progressing ?  ?Problem: Pain Managment: ?Goal: General experience of comfort will improve ?Outcome: Progressing ?  ?Problem: Safety: ?Goal: Ability to remain free from injury will improve ?Outcome: Progressing ?  ?Problem: Skin Integrity: ?Goal: Risk for impaired skin integrity will decrease ?Outcome: Progressing ?  ?Problem: Cardiovascular: ?Goal: Ability to achieve and maintain adequate cardiovascular perfusion will improve ?Outcome: Progressing ?Goal: Vascular access site(s) Level 0-1 will be maintained ?Outcome: Progressing ?  ?

## 2022-01-03 NOTE — H&P (Addendum)
?Cardiology Admission History and Physical:  ? ?Patient ID: Jaime Harris ?MRN: [id number]; DOB: Aug 05, 1956  ? ?Admission date: 01/03/2022 ? ?PCP:  Debbrah Alar, NP ?  ?[facility] Providers ?Cardiologist:  None    ?Dr Stanford Breed saw in 2016 ? ?Chief Complaint:  NSTEMI  ? ?Patient Profile:  ? ?Jaime Harris is a 66 y.o. 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 ? ?History of Present Illness:  ? ?[name] had never had cardiac eval. 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 She had sudden onset of CP, 10/10, squeezing and pressure. +nausea, weakness. She also had a HA. She took ibuprofen for her HA, then alka seltzer, Rolaids, no help.  ? ?She went home from work, could not rest. [name] to [facility] about 9 am.  ? ?She got a GI cocktail, Zofran, SL NTG x 1, ASA 324 mg, heparin. Initially, the pain was relieved. Ez were elevated so she was transferred to [facility]. ? ?Now, the pain is back at a 3/10. N&V has returned. Nausea helped by Zofran. ? ?She has never had this pain before. She does not exercise, but is on her feet at work, has not noticed and exertional sx.  ? ?Still smokes, but right now feels motivated to quit.  ? ?Her aneurysm is being followed, has not been fixed.  ? ?Acknowledges her BP is high today, no hx HTN. ? ? ? ?Past Medical History:  ?Diagnosis Date  ? Aneurysm ([place]) 2020  ? supraclinoid R int carotid artery  ? Chronic tension headaches   ? Headache   ? Hyperlipidemia   ? borderline  ? Non-ST elevation (NSTEMI) myocardial infarction ([place]) 01/03/2022  ? Sickle cell trait ([place])   ? Thyroid disease   ? enlarged thyroid  ? Vertigo   ? Vitamin D deficiency   ? ? ?Past Surgical History:  ?Procedure Laterality Date  ? FOOT SURGERY Bilateral   ? Pt states she had bones removed from the sides of her feet and middle toes so she could  walk better.  ? TONSILLECTOMY  1975  ? TUBAL LIGATION    ?  ? ?Medications Prior to Admission: ?Prior to Admission medications   ?Medication Sig Start Date End Date Taking? Authorizing Provider  ?mirabegron ER (MYRBETRIQ) 25 MG TB24 tablet Take 1 tablet (25 mg total) by mouth daily. 07/13/21   Debbrah Alar, NP  ?Multiple Vitamin (MULTIVITAMIN) tablet Take 1 tablet by mouth daily.    [provider]  ?rizatriptan (MAXALT) 5 MG tablet Take 1 tablet (5 mg total) by mouth as needed for migraine. May repeat in 2 hours if needed 07/13/21   Debbrah Alar, NP  ?gabapentin (NEURONTIN) 100 MG capsule Take 1 capsule (100 mg total) by mouth 3 (three) times daily. 07/30/19 03/25/20  Debbrah Alar, NP  ?  ? ?Allergies:   No Known Allergies ? ?Social History:   ?Social History  ? ?Socioeconomic History  ? Marital status: Single  ?  Spouse name: Not on file  ? Number of children: 3  ? Years of education: Not on file  ? Highest education level: High school graduate  ?Occupational History  ?  Comment: Cashier  ?Tobacco Use  ? Smoking status: Every Day  ?  Packs/day: 0.75  ?  Types: Cigarettes  ? Smokeless tobacco: Never  ?Substance and  Sexual Activity  ? Alcohol use: Yes  ?  Comment: occasional  ? Drug use: No  ? Sexual activity: Not on file  ?Other Topics Concern  ? Not on file  ?Social History Narrative  ? Makes eye glasses.    ? Single  ? GED  ? She has 3 children  ? 27- Son Jaime Harris- lives locally- he has one son  ? 62- Daughter Jaime Harris- 7 children (4 of these children live with the pt- she is a primary care giver)  ? [place]- lives in [place] 1 daughter  ? Enjoys reading  ? Caffeine- coffee, 4 cups daily, soda every other day  ? ?Social Determinants of Health  ? ?Financial Resource Strain: Not on file  ?Food Insecurity: Not on file  ?Transportation Needs: Not on file  ?Physical Activity: Not on file  ?Stress: Not on file  ?Social Connections: Not on file  ?Intimate Partner Violence: Not on file   ?  ?Family History:   ?The patient's family history includes Diabetes in her brother, mother, and paternal grandmother; Heart disease in her mother and paternal grandmother. There is no history of Cancer, Kidney disease, Colon cancer, Colon polyps, Rectal cancer, or Stomach cancer.   ? ?ROS:  ?Please see the history of present illness.  ?All other ROS reviewed and negative.    ? ?Physical Exam/Data:  ? ?Vitals:  ? 01/03/22 1300 01/03/22 1315 01/03/22 1322 01/03/22 1403  ?BP: (!) 164/85 (!) 173/84 (!) 156/76 (!) 165/85  ?Pulse: 89 (!) 106 94 82  ?Resp: '15 17 17 16  '$ ?Temp:    98.1 ?F (36.7 ?C)  ?TempSrc:    Other (Comment)  ?SpO2: 100% 94% 99% 99%  ?Weight:      ?Height:      ? ? ?Intake/Output Summary (Last 24 hours) at 01/03/2022 1507 ?Last data filed at 01/03/2022 1128 ?Gross per 24 hour  ?Intake 1154.27 ml  ?Output --  ?Net 1154.27 ml  ? ? ?  01/03/2022  ? 10:04 AM 10/14/2021  ?  9:58 AM 07/13/2021  ?  9:40 AM  ?Last 3 Weights  ?Weight (lbs) 180 lb 180 lb 180 lb  ?Weight (kg) 81.647 kg 81.647 kg 81.647 kg  ?   ?Body mass index is 29.95 kg/m?.  ?General:  Well nourished, well developed, in acute distress ?HEENT: normal ?Neck: no JVD ?Vascular: No carotid bruits; Distal pulses 2+ bilaterally   ?Cardiac:  normal S1, S2; RRR; no murmur  ?Lungs: scattered dry rales bilaterally, no wheezing, rhonchi or rales  ?Abd: soft, nontender, no hepatomegaly  ?Ext: no edema ?Musculoskeletal:  No deformities, BUE and BLE strength normal and equal ?Skin: warm and dry  ?Neuro:  CNs 2-12 intact, no focal abnormalities noted ?Psych:  Normal affect  ? ? ?EKG:  The ECG that was done today was personally reviewed and demonstrates SR, HR 80, no acute ischemic changes ? ?Relevant CV Studies: ? ?Carotid Artery study 0217/2020 ?Summary:  ?Right Carotid: Velocities in the right ICA are consistent with a 1-39%  ?stenosis.  ? ?Left Carotid: Velocities in the left ICA are consistent with a 1-39%  ?stenosis.  ? ?Vertebrals:  Bilateral vertebral  arteries demonstrate antegrade flow.  ?Subclavians: Normal flow hemodynamics were seen in bilateral subclavian arteries.  ? ?ECHO: 02/03/2015 ?- Left ventricle: The cavity size was normal. There was mild focal  ?  basal hypertrophy of the septum. Systolic function was normal.  ?  The estimated ejection fraction was in the range of 60%  to 65%.  ?  Wall motion was normal; there were no regional wall motion  ?  abnormalities. Left ventricular diastolic function parameters  ?  were normal.  ?- Aortic valve: Trileaflet; normal thickness leaflets.  ?- Aortic root: The aortic root was normal in size.  ?- Mitral valve: Structurally normal valve. There was mild  ?  regurgitation.  ?- Right ventricle: The cavity size was normal. Wall thickness was  ?  normal. Systolic function was normal.  ?- Tricuspid valve: There was trivial regurgitation.  ?- Pulmonary arteries: Systolic pressure was within the normal  ?  range.  ?- Inferior vena cava: The vessel was normal in size.  ?- Pericardium, extracardiac: There was no pericardial effusion.  ? ?Laboratory Data: ? ?High Sensitivity Troponin:   ?Recent Labs  ?Lab 01/03/22 ?1015 01/03/22 ?1206  ?TROPONINIHS [id number]* [id number]*  ?    ?Chemistry ?Recent Labs  ?Lab 01/03/22 ?1015  ?NA 138  ?K 4.1  ?CL 105  ?CO2 24  ?GLUCOSE 122*  ?BUN 12  ?CREATININE 0.72  ?CALCIUM 9.3  ?GFRNONAA >60  ?ANIONGAP 9  ?  ?Recent Labs  ?Lab 01/03/22 ?1015  ?PROT 7.5  ?ALBUMIN 3.9  ?AST 27  ?ALT 15  ?ALKPHOS 123  ?BILITOT 0.3  ? ?Lipids  ?Lab Results  ?Component Value Date  ? CHOL 213 (H) 04/12/2021  ? HDL 44.50 04/12/2021  ? LDLCALC 134 (H) 04/01/2019  ? LDLDIRECT 132.0 04/12/2021  ? TRIG 206.0 (H) 04/12/2021  ? CHOLHDL 5 04/12/2021  ? ? ?Hematology ?Recent Labs  ?Lab 01/03/22 ?1015  ?WBC 6.7  ?RBC 4.72  ?HGB 13.8  ?HCT 39.3  ?MCV 83.3  ?MCH 29.2  ?MCHC 35.1  ?RDW 14.6  ?PLT 191  ? ?Thyroid No results for input(s): TSH, FREET4 in the last 168 hours. ?BNPNo results for input(s): BNP, PROBNP in the last 168 hours.   ?DDimer No results for input(s): DDIMER in the last 168 hours. ?No results found for: HGBA1C ? ? ?Radiology/Studies:  ?DG Chest Port 1 View ? ?Result Date: 01/03/2022 ?CLINICAL DATA:  Chest pain EXAM: PORTABLE CH

## 2022-01-03 NOTE — Interval H&P Note (Signed)
History and Physical Interval Note: ? ?01/03/2022 ?4:15 PM ? ?Jaime Harris  has presented today for surgery, with the diagnosis of nstemi.  The various methods of treatment have been discussed with the patient and family. After consideration of risks, benefits and other options for treatment, the patient has consented to  Procedure(s): ?LEFT HEART CATH AND CORONARY ANGIOGRAPHY (N/A) as a surgical intervention.  The patient's history has been reviewed, patient examined, no change in status, stable for surgery.  I have reviewed the patient's chart and labs.  Questions were answered to the patient's satisfaction.   ? ?Cath Lab Visit (complete for each Cath Lab visit) ? ?Clinical Evaluation Leading to the Procedure:  ? ?ACS: Yes.   ? ?Non-ACS:   ? ?Anginal Classification: CCS IV ? ?Anti-ischemic medical therapy: No Therapy ? ?Non-Invasive Test Results: No non-invasive testing performed ? ?Prior CABG: No previous CABG ? ? ?Lauree Chandler ? ? ?

## 2022-01-03 NOTE — Progress Notes (Signed)
Pt received from cath lab AxOx4, (R) TR band in place C/D/I level 0. Vital signs wnL and as per flow. Sister bedside. All questions and concerns addressed. Will continue to monitor and maintain safety. ?

## 2022-01-03 NOTE — Progress Notes (Signed)
Pt admitted to [place], VS wnL and as per flow. Pt oriented to 6E processes. Pt c/o a mild headache & nausea. All questions and concerns addressed. Call bell placed within reach, will continue to monitor and maintain safety.  ?

## 2022-01-03 NOTE — ED Notes (Signed)
Troponin 3796, results immediately provided face to face with ED MD ?

## 2022-01-03 NOTE — Telephone Encounter (Signed)
Patient is in ER now.

## 2022-01-03 NOTE — ED Notes (Signed)
Pt reports relief from pain following GI cocktail, however voiced concerns that she will have a panic attack due to the strange numb feeling it caused and disruption in ability to swallow.  Pt reassured the medication gives numbing sensation but does not impede ability to swallow, pt calm and relaxed at this time. ?

## 2022-01-03 NOTE — Telephone Encounter (Signed)
ho Is Calling Patient / Member / Family / Caregiver ?Call Type Triage / Clinical ?Relationship To Patient Self ?Return Phone Number [phone] (Primary) ?Chief Complaint CHEST PAIN - pain, pressure, heaviness or ?tightness ?Reason for Call Symptomatic / Request for Health Information ?Initial Comment Caller states her chest and head is hurting. She ?feels weak and lightheaded. Symptoms started ?around 2 AM. ?[place] Not Listed "[facility]" as per pt ?Translation No ?Nurse Assessment ?Nurse: D'Heur Lucia Gaskins, RN, Adrienne Date/Time (Eastern Time): 01/03/2022 9:28:19 AM ?Confirm and document reason for call. If ?symptomatic, describe symptoms. ?---Caller states her chest hurts in the middle "like ?something is stuck" and head is hurting-moderate ?headache. She feels weak and lightheaded. Symptoms ?started around 2 A.M. Eating or drinking causes ?nausea. ?Does the patient have any new or worsening ?symptoms? ---Yes ?Will a triage be completed? ---Yes ?Related visit to physician within the last 2 weeks? ---No ?Does the PT have any chronic conditions? (i.e. ?diabetes, asthma, this includes High risk factors for ?pregnancy, etc.) ?---No ?Is this a behavioral health or substance abuse call? ---No ?Guidelines ?Guideline Title Affirmed Question Affirmed Notes Nurse Date/Time (Eastern ?Time) ?Chest Pain [1] Chest pain lasts > ?5 minutes AND [2] ?age > 41 ?D'Heur Lucia Gaskins, ?RN, [facility] ?01/03/2022 9:30:28 ?AM ?Disp. Time (Eastern ?Time) Disposition Final User ?01/03/2022 9:24:10 AM Send to Urgent Gennette Pac, Miranda ?PLEASE NOTE: All timestamps contained within this report are represented as [place] Standard Time. ?CONFIDENTIALTY NOTICE: This fax transmission is intended only for the addressee. It contains information that is legally privileged, confidential or ?otherwise protected from use or disclosure. If you are not the intended recipient, you are strictly prohibited from reviewing, disclosing, copying using ?or  disseminating any of this information or taking any action in reliance on or regarding this information. If you have received this fax in error, please ?notify us immediately by telephone so that we can arrange for its return to [place]. Phone: [phone], Toll-Free: [phone], Fax: [phone] ?Page: 2 of 2 ?Call Id: [id number] ?Disp. Time (Eastern ?Time) Disposition Final User ?01/03/2022 9:37:03 AM 911 Outcome Documentation D'Heur Lucia Gaskins, RN, [facility] ?Reason: Patient driving to ED ?5/42/7062 9:35:08 AM Call EMS 911 Now Yes D'Heur Lucia Gaskins, RN, Adrienne ?Caller Disagree/Comply Comply ?Caller Understands Yes ?PreDisposition Go to Urgent Care/Walk-In Clinic ?Care Advice Given Per Guideline ?CALL EMS 911 NOW: * Triager Discretion: I'll call you back in a few minutes to be sure you were able to reach them. NOTE TO ?TRIAGER - IF CALLER ASKS ABOUT ASPIRIN: CARE ADVICE given per Chest Pain (Adult) guideline. ?Comments ?User: [facility], D'Heur Lucia Gaskins, RN Date/Time Eilene Ghazi Time): 01/03/2022 9:36:08 AM ?

## 2022-01-03 NOTE — ED Provider Notes (Addendum)
[facility] EMERGENCY DEPARTMENT Provider Note   CSN: [id number] Arrival date & time: 01/03/22  0944     History  Chief Complaint  Patient presents with   Chest Pain    Jaime Harris is a 66 y.o. female.  Patient is a 66 year old female with a history of thyroid disease, hyperlipidemia, headaches who is presenting today with complaint of chest pain, headache, nausea and vomiting that started around 2 AM last night while she was at work.  Patient reports before going to work she just did not feel quite right she felt tired and not herself but it was not till 2 AM that she really started feeling poorly.  She describes it as a sharp type pain in the middle of her chest that does not seem to be worse with eating, movement or exertion.  It is not pleuritic in nature.  She also noticed about the same time she had a headache and felt nauseated with an episode of vomiting.  She has not had any shortness of breath, cough, fever or diarrhea.  No history of GERD.  No recent travel outside of the country or known sick contacts.  She denies unilateral leg pain or swelling.  No significant family history for MI at a young age, she thinks her mother has blood clots but no other known family history of DVT or PE.  Patient does smoke cigarettes daily but denies consistent alcohol use and has no drug use.  She did try to take Rolaids while she was at work but it did not help with her symptoms.  The history is provided by the patient.  Chest Pain     Home Medications Prior to Admission medications   Medication Sig Start Date End Date Taking? Authorizing Provider  mirabegron ER (MYRBETRIQ) 25 MG TB24 tablet Take 1 tablet (25 mg total) by mouth daily. 07/13/21   Sandford Craze, NP  Multiple Vitamin (MULTIVITAMIN) tablet Take 1 tablet by mouth daily.    [provider]  rizatriptan (MAXALT) 5 MG tablet Take 1 tablet (5 mg total) by mouth as needed for migraine. May repeat in 2 hours  if needed 07/13/21   Sandford Craze, NP  gabapentin (NEURONTIN) 100 MG capsule Take 1 capsule (100 mg total) by mouth 3 (three) times daily. 07/30/19 03/25/20  Sandford Craze, NP      Allergies    Patient has no known allergies.    Review of Systems   Review of Systems  Cardiovascular:  Positive for chest pain.   Physical Exam Updated Vital Signs BP (!) 157/87   Pulse 87   Temp 97.8 F (36.6 C) (Oral)   Resp 20   Wt 81.6 kg   SpO2 98%   BMI 29.95 kg/m  Physical Exam Vitals and nursing note reviewed.  Constitutional:      General: She is not in acute distress.    Appearance: She is well-developed.  HENT:     Head: Normocephalic and atraumatic.  Eyes:     Pupils: Pupils are equal, round, and reactive to light.  Cardiovascular:     Rate and Rhythm: Normal rate and regular rhythm.     Heart sounds: Normal heart sounds. No murmur heard.   No friction rub.  Pulmonary:     Effort: Pulmonary effort is normal.     Breath sounds: Normal breath sounds. No wheezing or rales.  Abdominal:     General: Bowel sounds are normal. There is no distension.  Palpations: Abdomen is soft.     Tenderness: There is no abdominal tenderness. There is no guarding or rebound.  Musculoskeletal:        General: No tenderness. Normal range of motion.     Right lower leg: No edema.     Left lower leg: No edema.     Comments: No edema  Skin:    General: Skin is warm and dry.     Findings: No rash.  Neurological:     Mental Status: She is alert and oriented to person, place, and time. Mental status is at baseline.     Cranial Nerves: No cranial nerve deficit.  Psychiatric:        Mood and Affect: Mood normal.        Behavior: Behavior normal.    ED Results / Procedures / Treatments   Labs (all labs ordered are listed, but only abnormal results are displayed) Labs Reviewed  COMPREHENSIVE METABOLIC PANEL - Abnormal; Notable for the following components:      Result Value    Glucose, Bld 122 (*)    All other components within normal limits  TROPONIN I (HIGH SENSITIVITY) - Abnormal; Notable for the following components:   Troponin I (High Sensitivity) 1,670 (*)    All other components within normal limits  CBC WITH DIFFERENTIAL/PLATELET  LIPASE, BLOOD  URINALYSIS, ROUTINE W REFLEX MICROSCOPIC    EKG EKG Interpretation  Date/Time:  Tuesday January 03 2022 09:58:03 EDT Ventricular Rate:  77 PR Interval:  164 QRS Duration: 94 QT Interval:  376 QTC Calculation: 426 R Axis:   -27 Text Interpretation: Sinus rhythm Probable left atrial enlargement Borderline left axis deviation Abnormal R-wave progression, early transition No significant change since last tracing Confirmed by Gwyneth Sprout ([id number]) on 01/03/2022 10:42:39 AM  Radiology DG Chest Port 1 View  Result Date: 01/03/2022 CLINICAL DATA:  Chest pain EXAM: PORTABLE CHEST 1 VIEW COMPARISON:  04/28/2020 FINDINGS: Cardiac and mediastinal contours normal. Pulmonary vascularity normal. Mild right lower lobe airspace disease likely atelectasis. Left lung clear. No effusion. IMPRESSION: Mild right lower lobe airspace disease consistent with atelectasis. Electronically Signed   By: Marlan Palau M.D.   On: 01/03/2022 10:47    Procedures Procedures    Medications Ordered in ED Medications  aspirin chewable tablet 324 mg (has no administration in time range)  nitroGLYCERIN (NITROSTAT) SL tablet 0.4 mg (has no administration in time range)  ondansetron (ZOFRAN) injection 4 mg (4 mg Intravenous Given 01/03/22 1024)  lactated ringers bolus 1,000 mL ( Intravenous Infusion Verify 01/03/22 1042)  alum & mag hydroxide-simeth (MAALOX/MYLANTA) 200-200-20 MG/5ML suspension 30 mL (30 mLs Oral Given 01/03/22 1027)    And  lidocaine (XYLOCAINE) 2 % viscous mouth solution 15 mL (15 mLs Oral Given 01/03/22 1027)    ED Course/ Medical Decision Making/ A&P                           Medical Decision Making Amount and/or  Complexity of Data Reviewed Labs: ordered. Decision-making details documented in ED Course. Radiology: ordered and independent interpretation performed. Decision-making details documented in ED Course. ECG/medicine tests: ordered and independent interpretation performed. Decision-making details documented in ED Course.  Risk OTC drugs. Prescription drug management. Drug therapy requiring intensive monitoring for toxicity. Decision regarding hospitalization.   66 year old female presenting today with nonspecific chest discomfort as well as nausea, vomiting, headache and generally feeling unwell.  She denies any respiratory symptoms such as cough,  shortness of breath.  Low suspicion for pneumonia, PE, dissection.  Heart score of 4 today making her intermediate risk.  Low risk well's. Concern for possible ACS versus GI pathology such as gastritis, viral etiology.  Patient given nausea control and GI cocktail.  I independently interpreted patient's EKG today which shows normal sinus rhythm without acute ST findings concerning for ACS.  Labs and imaging are pending.  11:16 AM Pt initially had some improvement with GI cocktail but sx are back.  I independently visualized and interpreted patient's chest x-ray which has blunting of her right costophrenic angle.  Radiology reports findings are consistent with atelectasis.  I independently interpreted patient's labs today with a normal CBC, CMP and lipase.  Patient's troponin today is elevated at 1670.  On repeat evaluation she reported her pain initially went away but is back and still in the center of her chest.  She was given a dose of 324 mg of aspirin, will see if nitroglycerin improves her pain and heparin was started.  Would consult cardiology for NSTEMI.  Patient will require admission today and most likely catheterization.  11:28 AM Repeat EKG showed no significant changes.  Patient was on cardiac monitor throughout her stay without any evidence of  dysrhythmia. CRITICAL CARE Performed by: Deyani Hegarty Total critical care time: 30 minutes Critical care time was exclusive of separately billable procedures and treating other patients. Critical care was necessary to treat or prevent imminent or life-threatening deterioration. Critical care was time spent personally by me on the following activities: development of treatment plan with patient and/or surrogate as well as nursing, discussions with consultants, evaluation of patient's response to treatment, examination of patient, obtaining history from patient or surrogate, ordering and performing treatments and interventions, ordering and review of laboratory studies, ordering and review of radiographic studies, pulse oximetry and re-evaluation of patient's condition.         Final Clinical Impression(s) / ED Diagnoses Final diagnoses:  NSTEMI (non-ST elevated myocardial infarction) [facility])    Rx / DC Orders ED Discharge Orders     None         Gwyneth Sprout, MD 01/03/22 1116    Gwyneth Sprout, MD 01/03/22 1128

## 2022-01-03 NOTE — ED Notes (Signed)
Attempt to call report to receiving nurse on [facility], not available at this time, will call back, unit made aware ETA of [facility] for transport in 15 minutes. ?

## 2022-01-03 NOTE — Progress Notes (Signed)
ANTICOAGULATION CONSULT NOTE - Initial Consult ? ?Pharmacy Consult for heparin ?Indication: chest pain/ACS ? ?No Known Allergies ? ?Patient Measurements: ?Height: '5\' 5"'$  (165.1 cm) ?Weight: 81.6 kg (180 lb) ?IBW/kg (Calculated) : 57 ?Heparin Dosing Weight: 74.4kg ? ?Vital Signs: ?Temp: 97.8 ?F (36.6 ?C) (04/25 1002) ?Temp Source: Oral (04/25 1002) ?BP: 157/87 (04/25 1030) ?Pulse Rate: 87 (04/25 1030) ? ?Labs: ?Recent Labs  ?  01/03/22 ?1015  ?HGB 13.8  ?HCT 39.3  ?PLT 191  ?CREATININE 0.72  ?TROPONINIHS 1,670*  ? ? ?Estimated Creatinine Clearance: 73.9 mL/min (by C-G formula based on SCr of 0.72 mg/dL). ? ? ?Medical History: ?Past Medical History:  ?Diagnosis Date  ? Aneurysm ([place]) 2020  ? supraclinoid R int carotid artery  ? Chronic tension headaches   ? Headache   ? Hyperlipidemia   ? borderline  ? Sickle cell trait ([place])   ? Thyroid disease   ? enlarged thyroid  ? Vertigo   ? Vitamin D deficiency   ? ? ?Medications:  ?Infusions:  ? heparin    ? ? ?Assessment: ?34 yof presented to the ED with CP. Troponin is elevated and now starting IV heparin. Baseline CBC is WNL. She is not on anticoagulation PTA.  ? ?Goal of Therapy:  ?Heparin level 0.3-0.7 units/ml ?Monitor platelets by anticoagulation protocol: Yes ?  ?Plan:  ?Heparin bolus 4000 units IV x 1 ?Heparin gtt 900 units/hr ?Check a 6 hr heparin level ?Daily heparin level and CBC ? ?Nixie Laube, Rande Lawman ?01/03/2022,11:27 AM ? ? ?

## 2022-01-03 NOTE — ED Notes (Signed)
ED MD face to face communication of Troponin results of 1670 ?

## 2022-01-04 ENCOUNTER — Inpatient Hospital Stay (HOSPITAL_COMMUNITY): Payer: BC Managed Care – PPO

## 2022-01-04 ENCOUNTER — Encounter (HOSPITAL_COMMUNITY): Payer: Self-pay | Admitting: Cardiovascular Disease

## 2022-01-04 ENCOUNTER — Other Ambulatory Visit (HOSPITAL_COMMUNITY): Payer: Self-pay

## 2022-01-04 DIAGNOSIS — I2510 Atherosclerotic heart disease of native coronary artery without angina pectoris: Secondary | ICD-10-CM

## 2022-01-04 DIAGNOSIS — R112 Nausea with vomiting, unspecified: Secondary | ICD-10-CM

## 2022-01-04 DIAGNOSIS — R1013 Epigastric pain: Secondary | ICD-10-CM | POA: Insufficient documentation

## 2022-01-04 DIAGNOSIS — I720 Aneurysm of carotid artery: Secondary | ICD-10-CM

## 2022-01-04 DIAGNOSIS — I10 Essential (primary) hypertension: Secondary | ICD-10-CM

## 2022-01-04 DIAGNOSIS — E785 Hyperlipidemia, unspecified: Secondary | ICD-10-CM

## 2022-01-04 LAB — BASIC METABOLIC PANEL
Anion gap: 9 (ref 5–15)
BUN: 10 mg/dL (ref 8–23)
CO2: 24 mmol/L (ref 22–32)
Calcium: 9.2 mg/dL (ref 8.9–10.3)
Chloride: 105 mmol/L (ref 98–111)
Creatinine, Ser: 0.9 mg/dL (ref 0.44–1.00)
GFR, Estimated: >60 mL/min (ref >60)
Glucose, Bld: 116 mg/dL — ABNORMAL HIGH (ref 70–99)
Potassium: 3.6 mmol/L (ref 3.5–5.1)
Sodium: 138 mmol/L (ref 135–145)

## 2022-01-04 LAB — CBC
HCT: 36.5 % (ref 36.0–46.0)
Hemoglobin: 12.6 g/dL (ref 12.0–15.0)
MCH: 29.3 pg (ref 26.0–34.0)
MCHC: 34.5 g/dL (ref 30.0–36.0)
MCV: 84.9 fL (ref 80.0–100.0)
Platelets: 179 10*3/uL (ref 150–400)
RBC: 4.3 MIL/uL (ref 3.87–5.11)
RDW: 14.6 % (ref 11.5–15.5)
WBC: 9.4 10*3/uL (ref 4.0–10.5)
nRBC: 0 % (ref 0.0–0.2)

## 2022-01-04 LAB — LIPID PANEL
Cholesterol: 204 mg/dL — ABNORMAL HIGH (ref 0–200)
HDL: 48 mg/dL (ref >40)
LDL Cholesterol: 138 mg/dL — ABNORMAL HIGH (ref 0–99)
Total CHOL/HDL Ratio: 4.3 RATIO
Triglycerides: 89 mg/dL (ref <150)
VLDL: 18 mg/dL (ref 0–40)

## 2022-01-04 LAB — ECHOCARDIOGRAM COMPLETE
AR max vel: 2.45 cm2
AV Peak grad: 6.6 mmHg
Ao pk vel: 1.28 m/s
Area-P 1/2: 5.27 cm2
Height: 65 in
S' Lateral: 2.9 cm
Weight: 2782.4 oz

## 2022-01-04 MED ORDER — ATORVASTATIN CALCIUM 80 MG PO TABS
80.0000 mg | ORAL_TABLET | Freq: Every day | ORAL | 5 refills | Status: DC
  Filled 2022-01-04: qty 30, 30d supply, fill #0
  Filled 2022-02-12: qty 30, 30d supply, fill #1

## 2022-01-04 MED ORDER — CARVEDILOL 6.25 MG PO TABS
6.2500 mg | ORAL_TABLET | Freq: Two times a day (BID) | ORAL | 2 refills | Status: DC
Start: 2022-01-04 — End: 2023-02-13
  Filled 2022-01-04: qty 60, 30d supply, fill #0
  Filled 2022-02-12: qty 60, 30d supply, fill #1

## 2022-01-04 MED ORDER — FAMOTIDINE 20 MG PO TABS
20.0000 mg | ORAL_TABLET | Freq: Every day | ORAL | Status: DC
  Administered 2022-01-04: 20 mg via ORAL
  Filled 2022-01-04: qty 1

## 2022-01-04 MED ORDER — TICAGRELOR 90 MG PO TABS
90.0000 mg | ORAL_TABLET | Freq: Two times a day (BID) | ORAL | 11 refills | Status: DC
Start: 2022-01-04 — End: 2022-06-13
  Filled 2022-01-04: qty 60, 30d supply, fill #0
  Filled 2022-02-12: qty 60, 30d supply, fill #1

## 2022-01-04 MED ORDER — LOSARTAN POTASSIUM 25 MG PO TABS
25.0000 mg | ORAL_TABLET | Freq: Every day | ORAL | 2 refills | Status: DC
  Filled 2022-01-04: qty 30, 30d supply, fill #0

## 2022-01-04 MED ORDER — NITROGLYCERIN 0.4 MG SL SUBL
0.4000 mg | SUBLINGUAL_TABLET | SUBLINGUAL | 2 refills | Status: AC | PRN
Start: 2022-01-04 — End: ?
  Filled 2022-01-04: qty 25, 14d supply, fill #0

## 2022-01-04 MED ORDER — LOSARTAN POTASSIUM 25 MG PO TABS
25.0000 mg | ORAL_TABLET | Freq: Every day | ORAL | Status: DC
  Administered 2022-01-04: 25 mg via ORAL
  Filled 2022-01-04: qty 1

## 2022-01-04 MED ORDER — ASPIRIN 81 MG PO TBEC
81.0000 mg | DELAYED_RELEASE_TABLET | Freq: Every day | ORAL | 11 refills | Status: DC
  Filled 2022-01-04: qty 30, 30d supply, fill #0
  Filled 2022-02-12: qty 30, 30d supply, fill #1

## 2022-01-04 MED ORDER — CARVEDILOL 6.25 MG PO TABS: 6.2500 mg | ORAL_TABLET | Freq: Two times a day (BID) | ORAL | Status: DC

## 2022-01-04 MED ORDER — NICOTINE 14 MG/24HR TD PT24
14.0000 mg | MEDICATED_PATCH | TRANSDERMAL | 0 refills | Status: AC
  Filled 2022-01-04: qty 30, 30d supply, fill #0

## 2022-01-04 MED ORDER — FAMOTIDINE 20 MG PO TABS
20.0000 mg | ORAL_TABLET | Freq: Every day | ORAL | 2 refills | Status: AC
Start: 2022-01-05 — End: ?
  Filled 2022-01-04: qty 30, 30d supply, fill #0
  Filled 2022-02-12: qty 30, 30d supply, fill #1

## 2022-01-04 NOTE — Discharge Instructions (Addendum)
Medication Changes: ?- START Aspirin '81mg'$  daily and Brilinta '90mg'$  twice daily. These medications are very important in helping keep the new stent in your heart open. ?- START Coreg 6.'25mg'$  twice daily. ?- START Losartan '25mg'$  daily. ?- START Lipitor '80mg'$  daily. ?- START Pepcid '20mg'$  daily. ?- STOP Rizatriptan and Ibuprofen. If you need anything for headaches or pain, recommend Tylenol.  ? ?Post NSTEMI: ?NO HEAVY LIFTING X 2 WEEKS. ?NO SEXUAL ACTIVITY X 2 WEEKS. ?NO DRIVING X 1 WEEK. ?NO SOAKING BATHS, HOT TUBS, POOLS, ETC., X 7 DAYS. ? ?Radial Site Care: ?Refer to this sheet in the next few weeks. These instructions provide you with information on caring for yourself after your procedure. Your caregiver may also give you more specific instructions. Your treatment has been planned according to current medical practices, but problems sometimes occur. Call your caregiver if you have any problems or questions after your procedure. ?HOME CARE INSTRUCTIONS ?You may shower the day after the procedure. Remove the bandage (dressing) and gently wash the site with plain soap and water. Gently pat the site dry.  ?Do not apply powder or lotion to the site.  ?Do not submerge the affected site in water for 3 to 5 days.  ?Inspect the site at least twice daily.  ?Do not flex or bend the affected arm for 24 hours.  ?No lifting over 5 pounds (2.3 kg) for 5 days after your procedure.  ?Do not drive home if you are discharged the same day of the procedure. Have someone else drive you.  ?What to expect: ?Any bruising will usually fade within 1 to 2 weeks.  ?Blood that collects in the tissue (hematoma) may be painful to the touch. It should usually decrease in size and tenderness within 1 to 2 weeks.  ?SEEK IMMEDIATE MEDICAL CARE IF: ?You have unusual pain at the radial site.  ?You have redness, warmth, swelling, or pain at the radial site.  ?You have drainage (other than a small amount of blood on the dressing).  ?You have chills.  ?You  have a fever or persistent symptoms for more than 72 hours.  ?You have a fever and your symptoms suddenly get worse.  ?Your arm becomes pale, cool, tingly, or numb.  ?You have heavy bleeding from the site. Hold pressure on the site.  ? ? ?

## 2022-01-04 NOTE — Progress Notes (Signed)
Pt safely discharged. Discharge packet provided with teach-back method. VS wnL and as per flow. IVs and telemetry removed, Pt verbalized understanding. All questions and concerns addressed. Awaiting on TOC medications for discharge.  ?

## 2022-01-04 NOTE — Discharge Summary (Signed)
?Discharge Summary  ?  ?Patient ID: Jaime Harris ?MRN: [id number]; DOB: 1956/05/29 ? ?Admit date: 01/03/2022 ?Discharge date: 01/04/2022 ? ?PCP:  Debbrah Alar, NP ?  ?[facility] Providers ?Cardiologist:  None   { ? ? ? ?Discharge Diagnoses  ?  ?Principal Problem: ?  Non-ST elevation (NSTEMI) myocardial infarction [facility]) ?Active Problems: ?  CAD (coronary artery disease) ?  Carotid aneurysm, right ([place]) ?  Essential hypertension ?  Hyperlipidemia ?  Nausea & vomiting ? ? ? ?Diagnostic Studies/Procedures  ?  ?Cardiac Catheterization 01/03/2022: ?  Mid RCA lesion is 80% stenosed. ?  Prox RCA lesion is 30% stenosed. ?  Prox LAD lesion is 20% stenosed. ?  Prox Cx to Mid Cx lesion is 30% stenosed. ?  A drug-eluting stent was successfully placed using a STENT ONYX FRONTIER 3.0X12. ?  Post intervention, there is a 0% residual stenosis. ?  The left ventricular systolic function is normal. ?  LV end diastolic pressure is normal. ?  The left ventricular ejection fraction is 55-65% by visual estimate. ?  ?The LAD is large caliber vessel that courses to the apex. There is mild non-obstructive plaque in the proximal LAD.  ?The Circumflex is a small to moderate caliber non-dominant vessel with mild non-obstructive plaque.  ?The RCA is a tortuous, large, dominant vessel. The proximal RCA has mild non-obstructive plaque. The mid RCA has a focal, severe stenosis. RFR of this lesion is 0.89 suggesting flow limitation.  ?Successful PTCA/DES x 1 mid RCA ?Normal LV systolic function ?LVEDP 16-18 mmHg.  ?  ?Recommendations: Will continue DAPT with ASA and Brilinta for one year post ACS. I will start a beta blocker and high intensity statin. Echo tomorrow.  ?  ?Diagnostic ?Dominance: Right ?Intervention ?  ?  ?_____________ ? ?Echocardiogram 01/04/2022: ?Formal read pending but per Dr. Debara Pickett LV function looks normal. ?  ?History of Present Illness   ?  ?Jaime Harris is a 66 y.o. 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 High-sensitivity troponin elevated at 1,670 >> 3,796. EKG showed no acute ischemic changes. She was started on IV Heparin and admitted. ? ?Hospital Course  ?   ?Consultants: None  ? ?NSTEMI ?CAD ?Patient admitted with NSTEMI after presenting with sudden onset of chest pain as stated above. High-sensitivity troponin elevated at 1,670 >> 3,796. LHC on 01/03/2022 showed 30% stenosis of proximal RCA followed by 80% stenosis of mid RCA, 20% stenosis of proximal LAD, and 30% stenosis of proximal to mid LCX. She underwent successful PCI with DES to mid RCA lesion. LVEDP 16-18 mmHg. She tolerated the procedure well with no recurrent chest pain. Formal Echo read pending at time of discharge but Dr. Debara Pickett looked at images and reports no significant abnormalities. LV function appears normal. She was started on DAPT with Aspirin and Brilinta as well as a beta-blocker and high-intensity statin.  ? ?Of note, she had some transient shortness of breath that sounds like it is from the [place]. Recommended trying to take this with caffeine. ? ?Supraclinoid Right Internal Carotid Aneurysm ?Previously seen by Dr. Trula Slade in 2020 and was referred to Neurosurgery and seen by Dr. Leta [facility]. Head MRA in 12/2018 showed stable small right supraclinoid aneurysm measuring 34m with no change from  CTA in 2017. It does not look like she has had any repeat imaging since that time. Recommended following back up with Neurosurgery. ? ?Hypertension ?New diagnosis this admission. Systolic BP as high as 387 and mostly above 140. She was initially started on Lopressor but this was switched to Coreg 6.'25mg'$  twice daily for additional BP control. Will also start  Losartan '25mg'$  daily. Will need repeat BMET at follow-up visit. ? ?Hyperlipidemia ?Lipid panel this admission: Total Cholesterol 204, Triglycerides 89, HDL 48, LDL 138. Started on Lipitor '80mg'$  daily. Will need repeat lipid panel and LFTs in 6-8 weeks. ? ?Nausea/Vomiting ?Patient presented with some nausea/vomiting. LFTs and lipase normal. No real improvement with Zofran. She continues to have nausea/vomiting on day of discharge following cath but patient feels like she is OK to go home. Wonder if this is a viral illness. Prescribed Pepcid at discharge to see if this will help but advised patient to follow-up with PCP if symptoms continue.  ? ?History of Migraines ?Patient has a history of migraines and takes Rizatriptan as needed. She states she rarely needs this. Advised avoiding any triptans given CAD with recent MI.  ? ?Patient seen and examined by Dr. Debara Pickett today and determined to be stable for discharge. Outpatient follow-up arranged. Medications as below. ? ? ?Did the patient have an acute coronary syndrome (MI, NSTEMI, STEMI, etc) this admission?:  Yes                              ? ?AHA/ACC Clinical Performance & Quality Measures: ?Aspirin prescribed? - Yes ?ADP Receptor Inhibitor (Plavix/Clopidogrel, Brilinta/Ticagrelor or Effient/Prasugrel) prescribed (includes medically managed patients)? - Yes ?Beta Blocker prescribed? - Yes ?High Intensity Statin (Lipitor 40-'80mg'$  or Crestor 20-'40mg'$ ) prescribed? - Yes ?EF assessed during THIS hospitalization? - Yes ?For EF <40%, was ACEI/ARB prescribed? - Not Applicable (EF >/= 56%) ?For EF <40%, Aldosterone Antagonist (Spironolactone or Eplerenone) prescribed? - Not Applicable (EF >/= 43%) ?Cardiac Rehab Phase II ordered (including medically managed patients)? - Yes  ?_____________ ? ?Discharge Vitals ?Blood pressure (!) 173/78, pulse 70, temperature 98.9 ?F (37.2 ?C), temperature source Oral, resp. rate 18, height '5\' 5"'$  (1.651 m), weight 78.9 kg, SpO2 97 %.  ?Filed  Weights  ? 01/03/22 1004 01/04/22 0500  ?Weight: 81.6 kg 78.9 kg  ? ? ?Labs & Radiologic Studies  ?  ?CBC ?Recent Labs  ?  01/03/22 ?1015 01/04/22 ?0409  ?WBC 6.7 9.4  ?NEUTROABS 4.4  --   ?HGB 13.8 12.6  ?HCT 39.3 36.5  ?MCV 83.3 84.9  ?PLT 191 179  ? ?Basic Metabolic Panel ?Recent Labs  ?  01/03/22 ?1015 01/04/22 ?0409  ?NA 138 138  ?K 4.1 3.6  ?CL 105 105  ?CO2 24 24  ?GLUCOSE 122* 116*  ?BUN 12 10  ?CREATININE 0.72 0.90  ?CALCIUM 9.3 9.2  ? ?Liver Function Tests ?Recent Labs  ?  01/03/22 ?1015  ?AST 27  ?ALT 15  ?ALKPHOS 123  ?BILITOT 0.3  ?PROT 7.5  ?ALBUMIN 3.9  ? ?Recent Labs  ?  01/03/22 ?1015  ?LIPASE 22  ? ?High Sensitivity Troponin:   ?Recent Labs  ?Lab 01/03/22 ?1015 01/03/22 ?1206  ?TROPONINIHS [id number]* 3,796*  ?  ?BNP ?Invalid input(s): POCBNP ?D-Dimer ?No results for input(s): DDIMER in the last 72 hours. ?Hemoglobin A1C ?Recent Labs  ?  01/03/22 ?1846  ?HGBA1C 6.1*  ? ?Fasting Lipid Panel ?Recent Labs  ?  01/04/22 ?0409  ?CHOL 204*  ?  HDL 48  ?LDLCALC 138*  ?TRIG 89  ?CHOLHDL 4.3  ? ?Thyroid Function Tests ?Recent Labs  ?  01/03/22 ?1846  ?TSH 0.565  ? ?_____________  ?CARDIAC CATHETERIZATION ? ?Result Date: 01/03/2022 ?  Mid RCA lesion is 80% stenosed.   Prox RCA lesion is 30% stenosed.   Prox LAD lesion is 20% stenosed.   Prox Cx to Mid Cx lesion is 30% stenosed.   A drug-eluting stent was successfully placed using a STENT ONYX FRONTIER 3.0X12.   Post intervention, there is a 0% residual stenosis.   The left ventricular systolic function is normal.   LV end diastolic pressure is normal.   The left ventricular ejection fraction is 55-65% by visual estimate. The LAD is large caliber vessel that courses to the apex. There is mild non-obstructive plaque in the proximal LAD. The Circumflex is a small to moderate caliber non-dominant vessel with mild non-obstructive plaque. The RCA is a tortuous, large, dominant vessel. The proximal RCA has mild non-obstructive plaque. The mid RCA has a focal, severe  stenosis. RFR of this lesion is 0.89 suggesting flow limitation. Successful PTCA/DES x 1 mid RCA Normal LV systolic function LVEDP 64-33 mmHg. Recommendations: Will continue DAPT with ASA and Brilinta for one year post [facility]

## 2022-01-04 NOTE — Progress Notes (Signed)
? ?Progress Note ? ?Patient Name: Jaime Harris ?Date of Encounter: 01/04/2022 ? ?[facility] Cardiologist: None  ? ?Subjective  ? ?Patient denies any recurrent chest pain. She does describe transient shortness of breath which sounds consistent with Brilinta. She also continues to have nausea/vomiting. Lipase and LFTs normal. She states she was doing well until she took her medications about 20 minutes ago and now nausea has returned. ? ?Inpatient Medications  ?  ?Scheduled Meds: ? aspirin EC  81 mg Oral Daily  ? atorvastatin  80 mg Oral QHS  ? metoprolol tartrate  25 mg Oral BID  ? sodium chloride flush  3 mL Intravenous Q12H  ? ticagrelor  90 mg Oral BID  ? ?Continuous Infusions: ? sodium chloride 100 mL/hr at 01/03/22 1535  ? sodium chloride    ? nitroGLYCERIN Stopped (01/04/22 0030)  ? ?PRN Meds: ?sodium chloride, acetaminophen, ALPRAZolam, nitroGLYCERIN, ondansetron (ZOFRAN) IV, ondansetron (ZOFRAN) IV, ondansetron (ZOFRAN) IV, sodium chloride flush, zolpidem  ? ?Vital Signs  ?  ?Vitals:  ? 01/03/22 2240 01/04/22 0000 01/04/22 0500 01/04/22 0541  ?BP: (!) 165/77 (!) 143/77  (!) 141/95  ?Pulse: 81 84 70   ?Resp:    19  ?Temp:   98.1 ?F (36.7 ?C) 98.1 ?F (36.7 ?C)  ?TempSrc:   Oral Oral  ?SpO2: 98% 97%    ?Weight:   78.9 kg   ?Height:      ? ? ?Intake/Output Summary (Last 24 hours) at 01/04/2022 0849 ?Last data filed at 01/04/2022 0600 ?Gross per 24 hour  ?Intake 1797.1 ml  ?Output 300 ml  ?Net 1497.1 ml  ? ? ?  01/04/2022  ?  5:00 AM 01/03/2022  ? 10:04 AM 10/14/2021  ?  9:58 AM  ?Last 3 Weights  ?Weight (lbs) 173 lb 14.4 oz 180 lb 180 lb  ?Weight (kg) 78.881 kg 81.647 kg 81.647 kg  ?   ? ?Telemetry  ?  ?Normal sinus rhythm with rates in the 60s to 80s. - Personally Reviewed ? ?ECG  ?  ?Normal sinus rhythm, rate 79 bpm, with incomplete RBBB, LVH, and mild T wave inversions in lead III. - Personally Reviewed ? ?Physical Exam  ? ?GEN: No acute distress.   ?Neck: No JVD. ?Cardiac: RRR. No murmurs, rubs, or gallops.  Right radial cath site soft with no signs of hematoma. ?Respiratory: Clear to auscultation bilaterally. No wheezes, rhonchi, or rales. ?GI: Soft, non-distended, and non-tender. ?MS: No lower extremity edema. No deformity. ?Neuro:  No focal deficits. ?Psych: Normal affect. Responds appropriately.  ? ?Labs  ?  ?High Sensitivity Troponin:   ?Recent Labs  ?Lab 01/03/22 ?1015 01/03/22 ?1206  ?TROPONINIHS [id number]* [id number]*  ?   ?Chemistry ?Recent Labs  ?Lab 01/03/22 ?1015 01/04/22 ?0409  ?NA 138 138  ?K 4.1 3.6  ?CL 105 105  ?CO2 24 24  ?GLUCOSE 122* 116*  ?BUN 12 10  ?CREATININE 0.72 0.90  ?CALCIUM 9.3 9.2  ?PROT 7.5  --   ?ALBUMIN 3.9  --   ?AST 27  --   ?ALT 15  --   ?ALKPHOS 123  --   ?BILITOT 0.3  --   ?GFRNONAA >60 >60  ?ANIONGAP 9 9  ?  ?Lipids  ?Recent Labs  ?Lab 01/04/22 ?0409  ?CHOL 204*  ?TRIG 89  ?HDL 48  ?LDLCALC 138*  ?CHOLHDL 4.3  ?  ?Hematology ?Recent Labs  ?Lab 01/03/22 ?1015 01/04/22 ?0409  ?WBC 6.7 9.4  ?RBC 4.72 4.30  ?HGB 13.8 12.6  ?HCT 39.3  36.5  ?MCV 83.3 84.9  ?MCH 29.2 29.3  ?MCHC 35.1 34.5  ?RDW 14.6 14.6  ?PLT 191 179  ? ?Thyroid  ?Recent Labs  ?Lab 01/03/22 ?1846  ?TSH 0.565  ?  ?BNPNo results for input(s): BNP, PROBNP in the last 168 hours.  ?DDimer No results for input(s): DDIMER in the last 168 hours.  ? ?Radiology  ?  ?CARDIAC CATHETERIZATION ? ?Result Date: 01/03/2022 ?  Mid RCA lesion is 80% stenosed.   Prox RCA lesion is 30% stenosed.   Prox LAD lesion is 20% stenosed.   Prox Cx to Mid Cx lesion is 30% stenosed.   A drug-eluting stent was successfully placed using a STENT ONYX FRONTIER 3.0X12.   Post intervention, there is a 0% residual stenosis.   The left ventricular systolic function is normal.   LV end diastolic pressure is normal.   The left ventricular ejection fraction is 55-65% by visual estimate. The LAD is large caliber vessel that courses to the apex. There is mild non-obstructive plaque in the proximal LAD. The Circumflex is a small to moderate caliber non-dominant vessel with  mild non-obstructive plaque. The RCA is a tortuous, large, dominant vessel. The proximal RCA has mild non-obstructive plaque. The mid RCA has a focal, severe stenosis. RFR of this lesion is 0.89 suggesting flow limitation. Successful PTCA/DES x 1 mid RCA Normal LV systolic function LVEDP 16-10 mmHg. Recommendations: Will continue DAPT with ASA and Brilinta for one year post ACS. I will start a beta blocker and high intensity statin. Echo tomorrow.  ? ?DG Chest Port 1 View ? ?Result Date: 01/03/2022 ?CLINICAL DATA:  Chest pain EXAM: PORTABLE CHEST 1 VIEW COMPARISON:  04/28/2020 FINDINGS: Cardiac and mediastinal contours normal. Pulmonary vascularity normal. Mild right lower lobe airspace disease likely atelectasis. Left lung clear. No effusion. IMPRESSION: Mild right lower lobe airspace disease consistent with atelectasis. Electronically Signed   By: Franchot Gallo M.D.   On: 01/03/2022 10:47   ? ?Cardiac Studies  ? ?Cardiac Catheterization 01/03/2022: ?  Mid RCA lesion is 80% stenosed. ?  Prox RCA lesion is 30% stenosed. ?  Prox LAD lesion is 20% stenosed. ?  Prox Cx to Mid Cx lesion is 30% stenosed. ?  A drug-eluting stent was successfully placed using a STENT ONYX FRONTIER 3.0X12. ?  Post intervention, there is a 0% residual stenosis. ?  The left ventricular systolic function is normal. ?  LV end diastolic pressure is normal. ?  The left ventricular ejection fraction is 55-65% by visual estimate. ?  ?The LAD is large caliber vessel that courses to the apex. There is mild non-obstructive plaque in the proximal LAD.  ?The Circumflex is a small to moderate caliber non-dominant vessel with mild non-obstructive plaque.  ?The RCA is a tortuous, large, dominant vessel. The proximal RCA has mild non-obstructive plaque. The mid RCA has a focal, severe stenosis. RFR of this lesion is 0.89 suggesting flow limitation.  ?Successful PTCA/DES x 1 mid RCA ?Normal LV systolic function ?LVEDP 16-18 mmHg.  ?  ?Recommendations: Will  continue DAPT with ASA and Brilinta for one year post ACS. I will start a beta blocker and high intensity statin. Echo tomorrow.  ? ?Diagnostic ?Dominance: Right ?Intervention ? ? ? ?Patient Profile  ?   ?66 y.o. female with a history of trace MR/TR on Echo in 2016, hyperlipidemia, sickle cell trait, enlarged thyroid, and family history of CAD who was admitted on 01/03/2022 for NSTEMI after presenting with sudden onset of chest pain  with associated nausea and weakness. ? ?Assessment & Plan  ?  ?NSTEMI ?CAD ?NSTEMI ?Patient admitted with NSTEMI after presenting with sudden onset of chest pain as stated above. High-sensitivity troponin elevated at 1,670 >> 3,796. LHC on 01/03/2022 showed 30% stenosis of proximal RCA followed by 80% stenosis of mid RCA, 20% stenosis of proximal LAD, and 30% stenosis of proximal to mid LCX. She underwent successful PCI with DES to mid RCA lesion. LVEDP 16-18 mmHg.  ?- No recurrent chest pain. ?- Started on DAPT with Aspirin and Brilinta. ?- Started on beta-blocker and high-intensity statin. ?  ?Supraclinoid Right Internal Carotid Aneurysm ?Previously seen by Dr. Trula Slade in 2020 and was referred to Neurosurgery and seen by Dr. Leta Baptist. Head MRA in 12/2018 showed stable small right supraclinoid aneurysm measuring 35m with no change from CTA in 2017. It does not look like she has had any repeat imaging since that time. Recommended following back up with Neurosurgery. ?  ?Hypertension ?New diagnosis this admission. Systolic BP as high as 1235and mostly above 140. ?- Started on Lopressor '25mg'$  twice daily. Will switch to Coreg 6.'25mg'$  twice daily for better BP control. ?  ?Hyperlipidemia ?Lipid panel this admission: Total Cholesterol 204, Triglycerides 89, HDL 48, LDL 138.  ?- Started on Lipitor '80mg'$  daily.  ?- Will need repeat lipid panel and LFTs in 6-8 weeks. ?  ?Nausea/Vomiting ?Patient continues to have nausea and vomiting this morning. Worse after taking morning medications. Zofran is not  helping. LFTs and Lipase normal.  ?- Will discuss with MD. ? ? ?For questions or updates, please contact [facility]?Please consult [email or web address] for contact info under  ? ?  ?   ?Signed, ?Geralyn Figiel E

## 2022-01-04 NOTE — Progress Notes (Signed)
CARDIAC REHAB PHASE I  ? ?PRE:  Rate/Rhythm: 72 SR ? ?  BP: sitting 164/76 ? ?  SaO2: 99 RA ? ?MODE:  Ambulation: 280 ft  ? ?POST:  Rate/Rhythm: 86 SR ? ?  BP: sitting 162/63  ? ?  SaO2:  ? ?Pt ambulated without c/o except her ongoing nausea. She vomited earlier. BP elevated. Discussed with pt and granddaughter MI, stent, Brilinta importance, smoking cessation, diet, exercise, NTG and CRPII. Pt receptive. She plans to quit smoking. Will refer to [place]. Notified her of her elevated A1C.  ?[id number]  ? ?Jaime Harris CES, ACSM ?01/04/2022 ?11:05 AM ? ? ? ? ?

## 2022-01-06 NOTE — Progress Notes (Signed)
?Cardiology Office Note:   ? ?Date:  01/11/2022  ? ?ID:  [name], DOB 1955/10/19, MRN [id number] ? ?PCP:  Debbrah Alar, NP  ?Cardiologist:  Early Osmond, MD  ?Electrophysiologist:  None  ? ?Referring MD: Debbrah Alar, NP  ? ?Chief Complaint: hospital follow-up of NSTEMI ? ?History of Present Illness:   ? ?Jaime Harris is a 66 y.o. female with a history of CAD with recent NSTEMI s/p DES to mid RCA on 01/03/2022, supraclinoid right internal carotid aneurysm followed by Neurosurgery, chronic dizziness not felt to be cardiac in nature, hypertension, hyperlipidemia, sickle cell trait, enlarged thyroid, and ongoing tobacco use who is followed by Dr. Ali Lowe and presents today for follow-up after recent NSTEMI. ? ?Patient was recently admitted from 01/03/2022 to 01/04/2022 for NSTEMI after presenting with sudden onset of chest pain with associated nausea and weakness. High-sensitivity troponin peaked at 3,796. LHC showed 30% stenosis of proximal RCA followed by 80% stenosis of mid RCA, 20% stenosis of proximal LAD, and 30% stenosis of proximal to mid LCX. She underwent successful PCI with DES to mid RCA lesion. LVEDP 16-18 mmHg. Echo showed LVEF of 55% with mild hypokinesis of the basal to mid inferior and inferoseptal myocardium, mild LVH, and grade 1 diastolic dysfunction. She was started on DAPT with Aspirin and Brilinta as well as beat-blocker, ARB, and statin. Patient continued to have some nausea/vomiting following PCI and she was started on Pepcid. ? ?Patient presents today for follow-up. Here alone.  Patient is doing relatively well since being discharged.  She denies any recurrent chest pain.  She does note some dyspnea mostly with exertion but also at rest that occurs randomly and does not last for long.  She also states her breathing is sometimes different laying down.  However, no PND or edema.  This really sounds like it is probably due to the Brilinta to me.  No palpitations.  She has  some chronic dizziness and reports some dizziness if she stands up too quickly but this is very brief and does not last long.  No falls or syncope.  She also reports some unsteadiness on her feet which she states is new.  I do not think this unsteadiness is cardiac in nature.  She is compliant with all her medications and is tolerating them well.  No abnormal bleeding on DAPT. She states nausea/vomiting has resolved. She continues to smoke but is working on quitting. She is down from 1 pack per day to 4 cigarettes per day. ? ?Past Medical History:  ?Diagnosis Date  ? Aneurysm ([place]) 2020  ? supraclinoid R int carotid artery  ? CAD (coronary artery disease)   ? LHC 01/03/2022: 30% of pRCA followed by 80% of mRCA s/p DES, 20% of pLAD, &30% of p-mLCX  ? Chronic tension headaches   ? Headache   ? Hyperlipidemia   ? borderline  ? Non-ST elevation (NSTEMI) myocardial infarction ([place]) 01/03/2022  ? Sickle cell trait ([place])   ? Thyroid disease   ? enlarged thyroid  ? Vertigo   ? Vitamin D deficiency   ? ? ?Past Surgical History:  ?Procedure Laterality Date  ? CORONARY STENT INTERVENTION N/A 01/03/2022  ? Procedure: CORONARY STENT INTERVENTION;  Surgeon: Burnell Blanks, MD;  Location: [facility];  Service: Cardiovascular;  Laterality: N/A;  ? FOOT SURGERY Bilateral   ? Pt states she had bones removed from the sides of her feet and middle toes so she could walk better.  ?  INTRAVASCULAR PRESSURE WIRE/FFR STUDY N/A 01/03/2022  ? Procedure: INTRAVASCULAR PRESSURE WIRE/FFR STUDY;  Surgeon: Burnell Blanks, MD;  Location: [facility];  Service: Cardiovascular;  Laterality: N/A;  ? LEFT HEART CATH AND CORONARY ANGIOGRAPHY N/A 01/03/2022  ? Procedure: LEFT HEART CATH AND CORONARY ANGIOGRAPHY;  Surgeon: Burnell Blanks, MD;  Location: [facility];  Service: Cardiovascular;  Laterality: N/A;  ? TONSILLECTOMY  1975  ? TUBAL LIGATION    ? ? ?Current Medications: ?Current Meds  ?Medication Sig  ?  APPLE CIDER VINEGAR PO Take 1 capsule by mouth daily.  ? aspirin 81 MG EC tablet Take 1 tablet (81 mg total) by mouth daily. Swallow whole.  ? atorvastatin (LIPITOR) 80 MG tablet Take 1 tablet (80 mg total) by mouth at bedtime.  ? carvedilol (COREG) 6.25 MG tablet Take 1 tablet (6.25 mg total) by mouth 2 (two) times daily with a meal.  ? famotidine (PEPCID) 20 MG tablet Take 1 tablet (20 mg total) by mouth daily.  ? losartan (COZAAR) 25 MG tablet Take 1 tablet (25 mg total) by mouth daily.  ? Multiple Vitamin (MULTIVITAMIN) tablet Take 1 tablet by mouth daily.  ? nicotine (NICODERM CQ - DOSED IN MG/24 HOURS) 14 mg/24hr patch Place 1 patch (14 mg total) onto the skin daily.  ? nitroGLYCERIN (NITROSTAT) 0.4 MG SL tablet Place 1 tablet (0.4 mg total) under the tongue every 5 (five) minutes x 3 doses as needed for chest pain.  ? ticagrelor (BRILINTA) 90 MG TABS tablet Take 1 tablet (90 mg total) by mouth 2 (two) times daily.  ?  ? ?Allergies:   Patient has no known allergies.  ? ?Social History  ? ?Socioeconomic History  ? Marital status: Single  ?  Spouse name: Not on file  ? Number of children: 3  ? Years of education: Not on file  ? Highest education level: High school graduate  ?Occupational History  ?  Comment: Cashier  ?Tobacco Use  ? Smoking status: Every Day  ?  Packs/day: 0.75  ?  Types: Cigarettes  ?  Passive exposure: Never  ? Smokeless tobacco: Never  ?Substance and Sexual Activity  ? Alcohol use: Yes  ?  Comment: occasional  ? Drug use: No  ? Sexual activity: Not on file  ?Other Topics Concern  ? Not on file  ?Social History Narrative  ? Makes eye glasses.    ? Single  ? GED  ? She has 3 children  ? 57- Son Jaime Harris- lives locally- he has one son  ? 78- Daughter Jaime Harris- 7 children (4 of these children live with the pt- she is a primary care giver)  ? Jaime Harris- lives in [place] 1 daughter  ? Enjoys reading  ? Caffeine- coffee, 4 cups daily, soda every other day  ? ?Social Determinants of Health   ? ?Financial Resource Strain: Not on file  ?Food Insecurity: Not on file  ?Transportation Needs: Not on file  ?Physical Activity: Not on file  ?Stress: Not on file  ?Social Connections: Not on file  ?  ? ?Family History: ?The patient's family history includes Diabetes in her brother, mother, and paternal grandmother; Heart disease in her mother and paternal grandmother. There is no history of Cancer, Kidney disease, Colon cancer, Colon polyps, Rectal cancer, or Stomach cancer. ? ?ROS:   ?Please see the history of present illness.    ? ?EKGs/Labs/Other Studies Reviewed:   ? ?The following studies were reviewed today: ? ?  Left Cardiac Catheterization 01/03/2022: ?  Mid RCA lesion is 80% stenosed. ?  Prox RCA lesion is 30% stenosed. ?  Prox LAD lesion is 20% stenosed. ?  Prox Cx to Mid Cx lesion is 30% stenosed. ?  A drug-eluting stent was successfully placed using a STENT ONYX FRONTIER 3.0X12. ?  Post intervention, there is a 0% residual stenosis. ?  The left ventricular systolic function is normal. ?  LV end diastolic pressure is normal. ?  The left ventricular ejection fraction is 55-65% by visual estimate. ?  ?The LAD is large caliber vessel that courses to the apex. There is mild non-obstructive plaque in the proximal LAD.  ?The Circumflex is a small to moderate caliber non-dominant vessel with mild non-obstructive plaque.  ?The RCA is a tortuous, large, dominant vessel. The proximal RCA has mild non-obstructive plaque. The mid RCA has a focal, severe stenosis. RFR of this lesion is 0.89 suggesting flow limitation.  ?Successful PTCA/DES x 1 mid RCA ?Normal LV systolic function ?LVEDP 16-18 mmHg.  ?  ?Recommendations: Will continue DAPT with ASA and Brilinta for one year post ACS. I will start a beta blocker and high intensity statin. Echo tomorrow.  ? ?Diagnostic ?Dominance: Right ?Intervention ? ? ?_______________ ? ?Echocardiogram 01/04/2022: ?Impressions: ?1. Mild hypokinesis of the basal to mid inferior and  inferoseptal  ?myocardium. Left ventricular ejection fraction, by estimation, is 55 to  ?60%. The left ventricle has normal function. The left ventricle  ?demonstrates regional wall motion abnormalities  ?(se

## 2022-01-10 ENCOUNTER — Telehealth: Payer: Self-pay | Admitting: Family

## 2022-01-10 ENCOUNTER — Encounter: Payer: Self-pay | Admitting: Student

## 2022-01-10 NOTE — Telephone Encounter (Signed)
Pt states she had tried to contact neuro referral, and they never called her back. She would like a new referral.  ?

## 2022-01-11 ENCOUNTER — Ambulatory Visit (INDEPENDENT_AMBULATORY_CARE_PROVIDER_SITE_OTHER): Payer: BC Managed Care – PPO | Admitting: Student

## 2022-01-11 ENCOUNTER — Encounter: Payer: Self-pay | Admitting: Student

## 2022-01-11 VITALS — BP 136/66 | HR 71 | Ht 65.0 in | Wt 176.6 lb

## 2022-01-11 DIAGNOSIS — I720 Aneurysm of carotid artery: Secondary | ICD-10-CM | POA: Diagnosis not present

## 2022-01-11 DIAGNOSIS — Z720 Tobacco use: Secondary | ICD-10-CM

## 2022-01-11 DIAGNOSIS — I10 Essential (primary) hypertension: Secondary | ICD-10-CM

## 2022-01-11 DIAGNOSIS — I2510 Atherosclerotic heart disease of native coronary artery without angina pectoris: Secondary | ICD-10-CM | POA: Diagnosis not present

## 2022-01-11 DIAGNOSIS — E785 Hyperlipidemia, unspecified: Secondary | ICD-10-CM

## 2022-01-11 LAB — BASIC METABOLIC PANEL
BUN/Creatinine Ratio: 8 — ABNORMAL LOW (ref 12–28)
BUN: 7 mg/dL — ABNORMAL LOW (ref 8–27)
CO2: 26 mmol/L (ref 20–29)
Calcium: 9.5 mg/dL (ref 8.7–10.3)
Chloride: 107 mmol/L — ABNORMAL HIGH (ref 96–106)
Creatinine, Ser: 0.88 mg/dL (ref 0.57–1.00)
Glucose: 92 mg/dL (ref 70–99)
Potassium: 4 mmol/L (ref 3.5–5.2)
Sodium: 144 mmol/L (ref 134–144)
eGFR: 73 mL/min/{1.73_m2} (ref >59)

## 2022-01-11 NOTE — Telephone Encounter (Signed)
Patient advised referral still open and given [facility] neurolog[facility]'s phone number to call again. ?

## 2022-01-11 NOTE — Patient Instructions (Addendum)
Medication Instructions:  ?Your Physician recommend you continue on your current medication as directed.   ? ?*If you need a refill on your cardiac medications before your next appointment, please call your pharmacy* ? ?Lab Work: ?BMET today ?Come back in 2 months for fasting lipid and liver panels. ?If you have labs (blood work) drawn today and your tests are completely normal, you will receive your results only by: ?MyChart Message (if you have MyChart) OR ?A paper copy in the mail ?If you have any lab test that is abnormal or we need to change your treatment, we will call you to review the results. ? ?Follow-Up: ?At [facility], you and your health needs are our priority.  As part of our continuing mission to provide you with exceptional heart care, we have created designated Provider Care Teams.  These Care Teams include your primary Cardiologist (physician) and Advanced Practice Providers (APPs -  Physician Assistants and Nurse Practitioners) who all work together to provide you with the care you need, when you need it. ? ?We recommend signing up for the patient portal called "MyChart".  Sign up information is provided on this After Visit Summary.  MyChart is used to connect with patients for Virtual Visits (Telemedicine).  Patients are able to view lab/test results, encounter notes, upcoming appointments, etc.  Non-urgent messages can be sent to your provider as well.   ?To learn more about what you can do with MyChart, go to [email or web address].   ? ?Your next appointment:   ?3 month(s) ? ?The format for your next appointment:   ?In Person ? ?Provider:   ?Early Osmond, MD  ? ?You may return back to work. ? ? ?

## 2022-01-12 ENCOUNTER — Telehealth (HOSPITAL_COMMUNITY): Payer: Self-pay

## 2022-01-12 NOTE — Telephone Encounter (Signed)
Called patient to see if she is interested in the Cardiac Rehab Program. Patient expressed interest. Explained scheduling process and went over insurance, patient verbalized understanding. Will contact patient for scheduling once f/u has been completed. 

## 2022-01-12 NOTE — Telephone Encounter (Signed)
Pt insurance is active and benefits verified through [employer] Co-pay 0, DED $1,500/$1,500 met, out of pocket $4,000/$1,909.12 met, co-insurance 20%. no pre-authorization required. Passport, 01/12/2022@1 :41pm, REF# [id number] ?  ?Will contact patient to see if she is interested in the Cardiac Rehab Program. If interested, patient will need to complete follow up appt. Once completed, patient will be contacted for scheduling upon review by the RN Navigator. ?

## 2022-01-16 ENCOUNTER — Ambulatory Visit (INDEPENDENT_AMBULATORY_CARE_PROVIDER_SITE_OTHER): Payer: BC Managed Care – PPO | Admitting: Diagnostic Neuroimaging

## 2022-01-16 ENCOUNTER — Encounter: Payer: Self-pay | Admitting: Diagnostic Neuroimaging

## 2022-01-16 VITALS — BP 133/79 | HR 86 | Ht 65.0 in | Wt 175.0 lb

## 2022-01-16 DIAGNOSIS — G4484 Primary exertional headache: Secondary | ICD-10-CM | POA: Diagnosis not present

## 2022-01-16 DIAGNOSIS — G43009 Migraine without aura, not intractable, without status migrainosus: Secondary | ICD-10-CM

## 2022-01-16 DIAGNOSIS — I671 Cerebral aneurysm, nonruptured: Secondary | ICD-10-CM | POA: Diagnosis not present

## 2022-01-16 MED ORDER — TOPIRAMATE 50 MG PO TABS: 50.0000 mg | ORAL_TABLET | Freq: Two times a day (BID) | ORAL | 12 refills | Status: DC

## 2022-01-16 NOTE — Progress Notes (Signed)
? ?[facility] ? ?PATIENT: Jaime Harris ?DOB: 1956/07/23 ? ?REFERRING CLINICIAN: Debbrah Alar, NP  ?HISTORY FROM: patient ?REASON FOR VISIT: new consult ? ? ?HISTORICAL ? ?CHIEF COMPLAINT:  ?Chief Complaint  ?Patient presents with  ? New Patient (Initial Visit)  ?  Rm 6 alone. Here for f/u on headaches and possible MRI to check aneurysm. Pt reports 1-2 h/a per week. She has noticed worsening h/a when she is sexually active, pain localized to the frontal lobe. Has tried rizatriptan was not beneficial.   ? ? ?HISTORY OF PRESENT ILLNESS:  ? ?UPDATE (01/16/22, VRP): Since last visit, having new HA, with burning sensation on back of neck. Triggered by certain activites, sexual activity, then throbbing HA with dizziness. Some intermittent mild tension HA also. Overall only 3-4 severe HA per month. Also recent NSTEMI and in hospital April 2023, now better.  ? ?PRIOR HPI (12/18/18, VRP): 66 year old female here for evaluation of asymptomatic unruptured cerebral aneurysm and chronic tension headaches. ? ?Patient has history of dizziness around 2014.  Also developed headaches in 2016.  She describes squeezing bitemporal headaches.  No nausea or vomiting.  No sensitivity light or sound.  Patient was diagnosed with chronic tension headaches.  She is been treated with nortriptyline with good results. ? ?In 2017 patient had CTA of the head to rule out other causes of headache.  Patient was found to have incidental right supraclinoid cerebral aneurysm measuring 3 to 4 mm.  Follow-up MRA was recommended but patient was lost to follow-up. ? ?Since that time patient is doing well.  No major problems with headache.  No new neurologic symptoms.  No numbness or tingling.  No weakness.  No vision changes. ? ?Patient is monitoring blood pressure.  Patient continues to smoke but is trying to quit. ? ? ? ?REVIEW OF SYSTEMS: Full 14 system review of systems performed and negative with exception of: As per  HPI. ? ?ALLERGIES: ?No Known Allergies ? ?HOME MEDICATIONS: ?Outpatient Medications Prior to Visit  ?Medication Sig Dispense Refill  ? APPLE CIDER VINEGAR PO Take 1 capsule by mouth daily.    ? aspirin 81 MG EC tablet Take 1 tablet (81 mg total) by mouth daily. Swallow whole. 30 tablet 11  ? atorvastatin (LIPITOR) 80 MG tablet Take 1 tablet (80 mg total) by mouth at bedtime. 30 tablet 5  ? carvedilol (COREG) 6.25 MG tablet Take 1 tablet (6.25 mg total) by mouth 2 (two) times daily with a meal. 60 tablet 2  ? famotidine (PEPCID) 20 MG tablet Take 1 tablet (20 mg total) by mouth daily. 30 tablet 2  ? losartan (COZAAR) 25 MG tablet Take 1 tablet (25 mg total) by mouth daily. 30 tablet 2  ? Multiple Vitamin (MULTIVITAMIN) tablet Take 1 tablet by mouth daily.    ? nicotine (NICODERM CQ - DOSED IN MG/24 HOURS) 14 mg/24hr patch Place 1 patch (14 mg total) onto the skin daily. 30 patch 0  ? nitroGLYCERIN (NITROSTAT) 0.4 MG SL tablet Place 1 tablet (0.4 mg total) under the tongue every 5 (five) minutes x 3 doses as needed for chest pain. 25 tablet 2  ? ticagrelor (BRILINTA) 90 MG TABS tablet Take 1 tablet (90 mg total) by mouth 2 (two) times daily. 60 tablet 11  ? ?No facility-administered medications prior to visit.  ? ? ?PAST MEDICAL HISTORY: ?Past Medical History:  ?Diagnosis Date  ? Aneurysm ([place]) 2020  ? supraclinoid R int carotid artery  ? CAD (coronary artery disease)   ?  LHC 01/03/2022: 30% of pRCA followed by 80% of mRCA s/p DES, 20% of pLAD, &30% of p-mLCX  ? Chronic tension headaches   ? Headache   ? Hyperlipidemia   ? borderline  ? Non-ST elevation (NSTEMI) myocardial infarction ([place]) 01/03/2022  ? Sickle cell trait ([place])   ? Thyroid disease   ? enlarged thyroid  ? Vertigo   ? Vitamin D deficiency   ? ? ?PAST SURGICAL HISTORY: ?Past Surgical History:  ?Procedure Laterality Date  ? CORONARY STENT INTERVENTION N/A 01/03/2022  ? Procedure: CORONARY STENT INTERVENTION;  Surgeon: Burnell Blanks, MD;  Location:  [facility];  Service: Cardiovascular;  Laterality: N/A;  ? FOOT SURGERY Bilateral   ? Pt states she had bones removed from the sides of her feet and middle toes so she could walk better.  ? INTRAVASCULAR PRESSURE WIRE/FFR STUDY N/A 01/03/2022  ? Procedure: INTRAVASCULAR PRESSURE WIRE/FFR STUDY;  Surgeon: Burnell Blanks, MD;  Location: [facility];  Service: Cardiovascular;  Laterality: N/A;  ? LEFT HEART CATH AND CORONARY ANGIOGRAPHY N/A 01/03/2022  ? Procedure: LEFT HEART CATH AND CORONARY ANGIOGRAPHY;  Surgeon: Burnell Blanks, MD;  Location: [facility];  Service: Cardiovascular;  Laterality: N/A;  ? TONSILLECTOMY  1975  ? TUBAL LIGATION    ? ? ?FAMILY HISTORY: ?Family History  ?Problem Relation Age of Onset  ? Heart disease Mother   ?     pacemaker/defibrillator  ? Diabetes Mother   ? Heart disease Paternal Grandmother   ?     died of CHF  ? Diabetes Paternal Grandmother   ? Diabetes Brother   ? Cancer Neg Hx   ? Kidney disease Neg Hx   ? Colon cancer Neg Hx   ? Colon polyps Neg Hx   ? Rectal cancer Neg Hx   ? Stomach cancer Neg Hx   ? ? ?SOCIAL HISTORY: ?Social History  ? ?Socioeconomic History  ? Marital status: Single  ?  Spouse name: Not on file  ? Number of children: 3  ? Years of education: Not on file  ? Highest education level: High school graduate  ?Occupational History  ?  Comment: Cashier  ?Tobacco Use  ? Smoking status: Every Day  ?  Packs/day: 0.75  ?  Types: Cigarettes  ?  Passive exposure: Never  ? Smokeless tobacco: Never  ?Substance and Sexual Activity  ? Alcohol use: Yes  ?  Comment: occasional  ? Drug use: No  ? Sexual activity: Not on file  ?Other Topics Concern  ? Not on file  ?Social History Narrative  ? Makes eye glasses.    ? Single  ? GED  ? She has 3 children  ? 47- Son [name]- lives locally- he has one son  ? 64- Daughter [name]- 7 children (4 of these children live with the pt- she is a primary care giver)  ? [place]- lives in [place] 1  daughter  ? Enjoys reading  ? Caffeine- coffee, 4 cups daily, soda every other day  ? ?Social Determinants of Health  ? ?Financial Resource Strain: Not on file  ?Food Insecurity: Not on file  ?Transportation Needs: Not on file  ?Physical Activity: Not on file  ?Stress: Not on file  ?Social Connections: Not on file  ?Intimate Partner Violence: Not on file  ? ? ? ?PHYSICAL EXAM ? ?GENERAL EXAM/CONSTITUTIONAL: ?Vitals:  ?Vitals:  ? 01/16/22 1430  ?BP: 133/79  ?Pulse: 86  ?Weight: 175 lb (  79.4 kg)  ?Height: '5\' 5"'$  (1.651 m)  ? ?Body mass index is 29.12 kg/m?. ?Wt Readings from Last 3 Encounters:  ?01/16/22 175 lb (79.4 kg)  ?01/11/22 176 lb 9.6 oz (80.1 kg)  ?01/04/22 173 lb 14.4 oz (78.9 kg)  ? ?Patient is in no distress; well developed, nourished and groomed; neck is supple ? ?CARDIOVASCULAR: ?Examination of carotid arteries is normal; no carotid bruits ?Regular rate and rhythm, no murmurs ?Examination of peripheral vascular system by observation and palpation is normal ? ?EYES: ?Ophthalmoscopic exam of optic discs and posterior segments is normal; no papilledema or hemorrhages ?No results found. ? ?MUSCULOSKELETAL: ?Gait, strength, tone, movements noted in Neurologic exam below ? ?NEUROLOGIC: ?MENTAL STATUS:  ?   ? View : No data to display.  ?  ?  ?  ? ?awake, alert, oriented to person, place and time ?recent and remote memory intact ?normal attention and concentration ?language fluent, comprehension intact, naming intact ?fund of knowledge appropriate ? ?CRANIAL NERVE:  ?2nd - no papilledema on fundoscopic exam ?2nd, 3rd, 4th, 6th - pupils equal and reactive to light, visual fields full to confrontation, extraocular muscles intact, no nystagmus ?5th - facial sensation symmetric ?7th - facial strength symmetric ?8th - hearing intact ?9th - palate elevates symmetrically, uvula midline ?11th - shoulder shrug symmetric ?12th - tongue protrusion midline ? ?MOTOR:  ?normal bulk and tone, full strength in the BUE,  BLE ? ?SENSORY:  ?normal and symmetric to light touch, temperature, vibration ? ?COORDINATION:  ?finger-nose-finger, fine finger movements normal ? ?REFLEXES:  ?deep tendon reflexes present and symmetric ? ?GAIT/STATIO

## 2022-01-16 NOTE — Patient Instructions (Signed)
CHRONIC TENSION HEADACHES + exertional headaches + migraine without aura ?- trial of topiramate '50mg'$  twice a day  ? ?UNRUPTURED, ASYMPTOMATIC CEREBRAL ANEURYSM (right supraclinoid) ?- monitor BP; maintain normotension ?- check CTA head w/wo ?- stop smoking cigarettes ?

## 2022-01-20 ENCOUNTER — Other Ambulatory Visit (HOSPITAL_COMMUNITY): Payer: Self-pay

## 2022-01-20 ENCOUNTER — Telehealth: Payer: Self-pay | Admitting: Family

## 2022-01-20 NOTE — Telephone Encounter (Signed)
See mychart.  

## 2022-01-25 ENCOUNTER — Telehealth: Payer: Self-pay | Admitting: Diagnostic Neuroimaging

## 2022-01-25 NOTE — Telephone Encounter (Signed)
Jaime Harris [id number] exp. 01/25/22-02/23/22 sent to GI ?I left a voice mail for the patient letting her know that I could not get the CT approved for [place] so I was going to send it to GI. ?

## 2022-01-31 ENCOUNTER — Telehealth (HOSPITAL_COMMUNITY): Payer: Self-pay | Admitting: Pharmacist

## 2022-01-31 NOTE — Telephone Encounter (Signed)
Attempted to reach patient three times.

## 2022-02-08 ENCOUNTER — Ambulatory Visit
Admission: RE | Admit: 2022-02-08 | Discharge: 2022-02-08 | Disposition: A | Discharge status: Home / Self Care | Payer: BC Managed Care – PPO | Source: Ambulatory Visit | Attending: Diagnostic Neuroimaging | Admitting: Diagnostic Neuroimaging

## 2022-02-08 DIAGNOSIS — R519 Headache, unspecified: Secondary | ICD-10-CM | POA: Diagnosis not present

## 2022-02-08 DIAGNOSIS — I671 Cerebral aneurysm, nonruptured: Secondary | ICD-10-CM

## 2022-02-08 MED ORDER — IOPAMIDOL (ISOVUE-370) INJECTION 76%
75.0000 mL | Freq: Once | INTRAVENOUS | Status: AC | PRN
  Administered 2022-02-08: 75 mL via INTRAVENOUS

## 2022-02-13 ENCOUNTER — Other Ambulatory Visit (HOSPITAL_BASED_OUTPATIENT_CLINIC_OR_DEPARTMENT_OTHER): Payer: Self-pay

## 2022-02-14 ENCOUNTER — Telehealth: Payer: Self-pay | Admitting: Family

## 2022-02-14 NOTE — Telephone Encounter (Signed)
Forms faxed into front office Placed in MO bin up front  

## 2022-02-15 NOTE — Telephone Encounter (Signed)
Form started and placed in provider's folder 

## 2022-02-20 ENCOUNTER — Telehealth (HOSPITAL_COMMUNITY): Payer: Self-pay

## 2022-02-20 ENCOUNTER — Encounter (HOSPITAL_COMMUNITY): Payer: Self-pay

## 2022-02-20 NOTE — Telephone Encounter (Signed)
Attempted to call patient in regards to Cardiac Rehab - LM on VM Mailed letter 

## 2022-02-21 ENCOUNTER — Telehealth: Payer: Self-pay | Admitting: Family

## 2022-02-21 NOTE — Telephone Encounter (Signed)
Attempted to reach pt to clarify diagnosis for her FMLA.  Unable to leave a message.  See mychart.

## 2022-02-22 DIAGNOSIS — Z0279 Encounter for issue of other medical certificate: Secondary | ICD-10-CM

## 2022-03-02 ENCOUNTER — Telehealth: Payer: Self-pay

## 2022-03-02 NOTE — Telephone Encounter (Signed)
-----   Message from Penni Bombard, MD sent at 03/02/2022  4:21 PM EDT ----- Stable small aneurysm (no change in 6 years). May repeat scan in 2-3 years. Try to stop smoking and monitor BP.  -VRP

## 2022-03-13 ENCOUNTER — Telehealth (HOSPITAL_COMMUNITY): Payer: Self-pay

## 2022-03-13 NOTE — Telephone Encounter (Signed)
No response from pt in regards to cardiac rehab. Closed referral 

## 2022-04-05 ENCOUNTER — Other Ambulatory Visit: Payer: Self-pay | Admitting: Family

## 2022-04-05 DIAGNOSIS — Z1231 Encounter for screening mammogram for malignant neoplasm of breast: Secondary | ICD-10-CM

## 2022-04-14 ENCOUNTER — Ambulatory Visit (INDEPENDENT_AMBULATORY_CARE_PROVIDER_SITE_OTHER): Payer: BC Managed Care – PPO | Admitting: Family

## 2022-04-14 ENCOUNTER — Telehealth: Payer: Self-pay | Admitting: Family

## 2022-04-14 ENCOUNTER — Other Ambulatory Visit (HOSPITAL_BASED_OUTPATIENT_CLINIC_OR_DEPARTMENT_OTHER): Payer: Self-pay

## 2022-04-14 ENCOUNTER — Encounter: Payer: Self-pay | Admitting: Family

## 2022-04-14 VITALS — BP 139/67 | HR 95 | Temp 98.7°F | Resp 16 | Ht 65.0 in | Wt 175.0 lb

## 2022-04-14 DIAGNOSIS — Z0000 Encounter for general adult medical examination without abnormal findings: Secondary | ICD-10-CM | POA: Diagnosis not present

## 2022-04-14 DIAGNOSIS — I10 Essential (primary) hypertension: Secondary | ICD-10-CM

## 2022-04-14 DIAGNOSIS — B353 Tinea pedis: Secondary | ICD-10-CM

## 2022-04-14 DIAGNOSIS — R739 Hyperglycemia, unspecified: Secondary | ICD-10-CM

## 2022-04-14 DIAGNOSIS — I2510 Atherosclerotic heart disease of native coronary artery without angina pectoris: Secondary | ICD-10-CM

## 2022-04-14 DIAGNOSIS — N3281 Overactive bladder: Secondary | ICD-10-CM

## 2022-04-14 DIAGNOSIS — H6123 Impacted cerumen, bilateral: Secondary | ICD-10-CM

## 2022-04-14 DIAGNOSIS — E785 Hyperlipidemia, unspecified: Secondary | ICD-10-CM | POA: Diagnosis not present

## 2022-04-14 DIAGNOSIS — Z720 Tobacco use: Secondary | ICD-10-CM | POA: Diagnosis not present

## 2022-04-14 DIAGNOSIS — Z1211 Encounter for screening for malignant neoplasm of colon: Secondary | ICD-10-CM

## 2022-04-14 DIAGNOSIS — E559 Vitamin D deficiency, unspecified: Secondary | ICD-10-CM

## 2022-04-14 MED ORDER — VARENICLINE TARTRATE 0.5 MG X 11 & 1 MG X 42 PO TBPK
ORAL_TABLET | ORAL | 0 refills | Status: DC
  Filled 2022-04-14: qty 53, 28d supply, fill #0

## 2022-04-14 NOTE — Assessment & Plan Note (Addendum)
Due for colo. Mammo scheduled. Declines shingrix and prevnar 20. Encouraged her to get flu shot and covid booster this fall. Discussed importance of healthy diet, exercise and weight loss.

## 2022-04-14 NOTE — Assessment & Plan Note (Signed)
Recommended otc ear wax softening kit. If not improvement she will let me know and we can refer to ENT.

## 2022-04-14 NOTE — Telephone Encounter (Signed)
See mychart.  

## 2022-04-14 NOTE — Assessment & Plan Note (Signed)
Wants to quit. Trial of chantix. Common side effects including rare risk of suicide ideation was discussed with the patient today.  Patient is instructed to go directly to the ED if this occurs.  We discussed that patient can continue to smoke for 1 week after starting chantix, but then must discontinue cigarettes.  She is also instructed to contact us prior to completion of the starter month pack for an rx for the continuation month pack.  5 minutes spent with patient today on tobacco cessation counseling.

## 2022-04-14 NOTE — Assessment & Plan Note (Signed)
S/p cath/stent 4/23.  Clinically stable. Followed by cardiology. She is currently on Brilinta,aspirin and statin.

## 2022-04-14 NOTE — Assessment & Plan Note (Signed)
Stopped myrbetriq due to cost. Symptoms are manageable currently off of medications. Monitor.

## 2022-04-14 NOTE — Progress Notes (Signed)
Subjective:   By signing my name below, I, Jaime Harris, attest that this documentation has been prepared under the direction and in the presence of Jaime Chimera, NP 04/14/2022   Patient ID: Jaime Harris, female    DOB: Oct 21, 1955, 66 y.o.   MRN: [id number]  Chief Complaint  Patient presents with  . Annual Exam    HPI Patient is in today for a comprehensive physical exam  Overactive Bladder: She states that taking Myrbetriq relieved her symptoms however, due to cost she has discontinued use of the medication. She reports that her symptoms are not as bad as before Migraines: She reports that the neurologist could not find anything in regards to her migraines. She was prescribed 50 Mg of Topiramate and reports that the medication is not relieving her symptoms. She does not have a follow up appointment scheduled with her neurologist.  Blood Pressure: As of today's visit, her blood pressure is normal. She is currently taking 6.25 Mg of Cavedilol and 25 Mg of Losartan.  BP Readings from Last 3 Encounters:  04/14/22 139/67  01/16/22 133/79  01/11/22 136/66   Pulse Readings from Last 3 Encounters:  04/14/22 95  01/16/22 86  01/11/22 71   Cholesterol: Her cholesterol levels are elevated. She is currently taking 80 Mg of atorvastatin Lab Results  Component Value Date   CHOL 204 (H) 01/04/2022   HDL 48 01/04/2022   LDLCALC 138 (H) 01/04/2022   LDLDIRECT 132.0 04/12/2021   TRIG 89 01/04/2022   CHOLHDL 4.3 01/04/2022   Smoking: She reports that she is continuing to smoke. She is interested in starting Chantix.  Spot on Right Foot: She reports of a hyperpigmented spot on her right foot. She states that her left foot is not as bad. She denies of any itchiness.  Ear Pain: She reports that her ear discomfort is still persistent.   She denies having any fever, new muscle pain, joint pain , new moles, congestion, sinus pain, sore throat, palpations, cough, SOB ,wheezing,n/v/d  constipation, blood in stool, dysuria, frequency, hematuria, depression, anxiety, headaches at this time  Social history: She reports that she received a stent this year. She denies of any changes to her family medical history.  Colonoscopy: :Last completed on 02/01/2015. She is due for a follow-up. She states that last time she was scheduled for a follow-up, the GI location informed her that she was not due for a colonoscopy yet. She is interested in being referred again for a colonoscopy appointment.  Dexa: Last completed on 05/02/2019 Mammogram: Last completed on 05/19/2021. She reports that her next mammogram is scheduled.  Immunizations: She is UTD on her tetanus vaccine. She has received one dose of the J&J Covid-19 vaccine. She is not interested in receiving a pneumonia or Shingrix vaccine during today's visit.  Diet: She states that her diet is okay. She does not eat much and does not eat much fried foods. She enjoys to drink coffee and sweat tea.  Exercise: She is not exercising regularly.  Vision: She is not UTD on vision exams.    Health Maintenance Due  Topic Date Due  . COLONOSCOPY (Pts 45-56yr Insurance coverage will need to be confirmed)  02/01/2020  . COVID-19 Vaccine (2 - Booster for [employer]) 03/31/2020  . INFLUENZA VACCINE  04/11/2022    Past Medical History:  Diagnosis Date  . Aneurysm ([place] 2020   supraclinoid R int carotid artery  . CAD (coronary artery disease)    LHC  01/03/2022: 30% of pRCA followed by 80% of mRCA s/p DES, 20% of pLAD, &30% of p-mLCX  . Chronic tension headaches   . Headache   . Hyperlipidemia    borderline  . Non-ST elevation (NSTEMI) myocardial infarction ([place]) 01/03/2022  . Sickle cell trait ([place])   . Thyroid disease    enlarged thyroid  . Vertigo   . Vitamin D deficiency     Past Surgical History:  Procedure Laterality Date  . CORONARY STENT INTERVENTION N/A 01/03/2022   Procedure: CORONARY STENT INTERVENTION;  Surgeon:  Burnell Blanks, MD;  Location: [facility];  Service: Cardiovascular;  Laterality: N/A;  . FOOT SURGERY Bilateral    Pt states she had bones removed from the sides of her feet and middle toes so she could walk better.  . INTRAVASCULAR PRESSURE WIRE/FFR STUDY N/A 01/03/2022   Procedure: INTRAVASCULAR PRESSURE WIRE/FFR STUDY;  Surgeon: Burnell Blanks, MD;  Location: [facility];  Service: Cardiovascular;  Laterality: N/A;  . LEFT HEART CATH AND CORONARY ANGIOGRAPHY N/A 01/03/2022   Procedure: LEFT HEART CATH AND CORONARY ANGIOGRAPHY;  Surgeon: Burnell Blanks, MD;  Location: [facility];  Service: Cardiovascular;  Laterality: N/A;  . TONSILLECTOMY  1975  . TUBAL LIGATION      Family History  Problem Relation Age of Onset  . Heart disease Mother        Investment banker, corporate  . Diabetes Mother   . Heart disease Paternal Grandmother        died of CHF  . Diabetes Paternal Grandmother   . Diabetes Brother   . Cancer Neg Hx   . Kidney disease Neg Hx   . Colon cancer Neg Hx   . Colon polyps Neg Hx   . Rectal cancer Neg Hx   . Stomach cancer Neg Hx     Social History   Socioeconomic History  . Marital status: Single    Spouse name: Not on file  . Number of children: 3  . Years of education: Not on file  . Highest education level: High school graduate  Occupational History    Comment: Cashier  Tobacco Use  . Smoking status: Every Day    Packs/day: 0.75    Types: Cigarettes    Passive exposure: Never  . Smokeless tobacco: Never  Substance and Sexual Activity  . Alcohol use: Yes    Comment: occasional  . Drug use: No  . Sexual activity: Not on file  Other Topics Concern  . Not on file  Social History Narrative   Makes eye glasses.     Single   GED   She has 3 children   39- Son Jaime Harris- lives locally- he has one son   1- Daughter Jaime Harris- 7 children (4 of these children live with the pt- she is a primary care giver)    [place]- lives in [place] 1 daughter   Enjoys reading   Caffeine- coffee, 4 cups daily, soda every other day   Social Determinants of Health   Financial Resource Strain: Not on file  Food Insecurity: Not on file  Transportation Needs: Not on file  Physical Activity: Not on file  Stress: Not on file  Social Connections: Not on file  Intimate Partner Violence: Not on file    Outpatient Medications Prior to Visit  Medication Sig Dispense Refill  . APPLE CIDER VINEGAR PO Take 1 capsule by mouth daily.    [place] Kitchen aspirin EC 81 MG tablet  Take 1 tablet (81 mg total) by mouth daily. Swallow whole. 30 tablet 11  . atorvastatin (LIPITOR) 80 MG tablet Take 1 tablet (80 mg total) by mouth at bedtime. 30 tablet 5  . carvedilol (COREG) 6.25 MG tablet Take 1 tablet (6.25 mg total) by mouth 2 (two) times daily with a meal. 60 tablet 2  . famotidine (PEPCID) 20 MG tablet Take 1 tablet (20 mg total) by mouth daily. 30 tablet 2  . losartan (COZAAR) 25 MG tablet Take 1 tablet (25 mg total) by mouth daily. 30 tablet 2  . Multiple Vitamin (MULTIVITAMIN) tablet Take 1 tablet by mouth daily.    . nitroGLYCERIN (NITROSTAT) 0.4 MG SL tablet Place 1 tablet (0.4 mg total) under the tongue every 5 (five) minutes x 3 doses as needed for chest pain. 25 tablet 2  . ticagrelor (BRILINTA) 90 MG TABS tablet Take 1 tablet (90 mg total) by mouth 2 (two) times daily. 60 tablet 11  . topiramate (TOPAMAX) 50 MG tablet Take 1 tablet (50 mg total) by mouth 2 (two) times daily. 60 tablet 12   No facility-administered medications prior to visit.    No Known Allergies  Review of Systems  Constitutional:  Negative for fever.  HENT:  Negative for congestion, sinus pain and sore throat.   Respiratory:  Negative for cough, shortness of breath and wheezing.   Cardiovascular:  Negative for palpitations.  Gastrointestinal:  Negative for blood in stool, constipation, diarrhea, nausea and vomiting.  Genitourinary:  Negative for  dysuria, frequency and hematuria.  Musculoskeletal:  Negative for joint pain and myalgias.  Skin:  Negative for rash.       (+) Hyperpigmented Spot on Right Foot (-) [place]  Neurological:  Negative for headaches.  Psychiatric/Behavioral:  Negative for depression. The patient is not nervous/anxious.        Objective:    Physical Exam Constitutional:      General: She is not in acute distress.    Appearance: Normal appearance. She is not ill-appearing.  HENT:     Head: Normocephalic and atraumatic.     Right Ear: Tympanic membrane, ear canal and external ear normal. There is impacted cerumen.     Left Ear: Tympanic membrane, ear canal and external ear normal. There is impacted cerumen.     Mouth/Throat:     Pharynx: No oropharyngeal exudate.  Eyes:     Extraocular Movements: Extraocular movements intact.     Pupils: Pupils are equal, round, and reactive to light.  Neck:     Thyroid: No thyromegaly.  Cardiovascular:     Rate and Rhythm: Normal rate and regular rhythm.     Heart sounds: Normal heart sounds. No murmur heard.    No gallop.  Pulmonary:     Effort: Pulmonary effort is normal. No respiratory distress.     Breath sounds: Normal breath sounds. No wheezing or rales.  Abdominal:     General: Bowel sounds are normal. There is no distension.     Palpations: Abdomen is soft.     Tenderness: There is no abdominal tenderness. There is no guarding.  Musculoskeletal:     Comments: 5/5 strength in both upper and lower extremities    Lymphadenopathy:     Cervical: No cervical adenopathy.  Skin:    General: Skin is warm and dry.     Findings: Rash present.     Comments: Hyperpigmented Rash on Sole of Right Foot  Neurological:     Mental Status:  She is alert and oriented to person, place, and time.     Deep Tendon Reflexes:     Reflex Scores:      Patellar reflexes are 2+ on the right side and 2+ on the left side. Psychiatric:        Mood and Affect: Mood normal.         Behavior: Behavior normal.        Judgment: Judgment normal.    BP 139/67 (BP Location: Right Arm, Patient Position: Sitting, Cuff Size: Small)   Pulse 95   Temp 98.7 F (37.1 C) (Oral)   Resp 16   Ht 5' 5"  (1.651 m)   Wt 175 lb (79.4 kg)   SpO2 97%   BMI 29.12 kg/m  Wt Readings from Last 3 Encounters:  04/14/22 175 lb (79.4 kg)  01/16/22 175 lb (79.4 kg)  01/11/22 176 lb 9.6 oz (80.1 kg)       Assessment & Plan:   Problem List Items Addressed This Visit       Unprioritized   Vitamin D deficiency   Relevant Orders   Vitamin D (25 hydroxy)   Tobacco abuse    Wants to quit. Trial of chantix. Common side effects including rare risk of suicide ideation was discussed with the patient today.  Patient is instructed to go directly to the ED if this occurs.  We discussed that patient can continue to smoke for 1 week after starting chantix, but then must discontinue cigarettes.  She is also instructed to contact us prior to completion of the starter month pack for an rx for the continuation month pack.  5 minutes spent with patient today on tobacco cessation counseling.        Tinea pedis of right foot    New. Recommended lamisil bid x 2 weeks.       Preventative health care - Primary    Due for colo. Mammo scheduled. Declines shingrix and prevnar 20. Encouraged her to get flu shot and covid booster this fall. Discussed importance of healthy diet, exercise and weight loss.       Overactive bladder    Stopped myrbetriq due to cost. Symptoms are manageable currently off of medications. Monitor.       Hyperlipidemia    Lab Results  Component Value Date   CHOL 204 (H) 01/04/2022   HDL 48 01/04/2022   LDLCALC 138 (H) 01/04/2022   LDLDIRECT 132.0 04/12/2021   TRIG 89 01/04/2022   CHOLHDL 4.3 01/04/2022  On atorvastatin 80, was above goal last check, Recheck today.      Relevant Orders   Comp Met (CMET)   Lipid panel   Essential hypertension    BP Readings from Last 3  Encounters:  04/14/22 139/67  01/16/22 133/79  01/11/22 136/66  At goal on carvedilol and losartan.       Relevant Orders   Comp Met (CMET)   Ceruminosis, bilateral    Recommended otc ear wax softening kit. If not improvement she will let me know and we can refer to ENT.       CAD (coronary artery disease)    S/p cath/stent 4/23.  Clinically stable. Followed by cardiology. She is currently on Brilinta,aspirin and statin.      Other Visit Diagnoses     Colon cancer screening       Relevant Orders   Ambulatory referral to Gastroenterology   Hyperglycemia       Relevant Orders   Hemoglobin A1c  Meds ordered this encounter  Medications  . varenicline (CHANTIX PAK) 0.5 MG X 11 & 1 MG X 42 tablet    Sig: Take one 0.5 mg tablet by mouth once daily for 3 days, then increase to one 0.5 mg tablet twice daily for 4 days, then increase to one 1 mg tablet twice daily.    Dispense:  53 tablet    Refill:  0    Order Specific Question:   Supervising Provider    Answer:   Penni Homans A [[id number]]    I, [name], NP, personally preformed the services described in this documentation.  All medical record entries made by the scribe were at my direction and in my presence.  I have reviewed the chart and discharge instructions (if applicable) and agree that the record reflects my personal performance and is accurate and complete. 04/14/2022   I,Amber Collins,acting as a scribe for [name], NP.,have documented all relevant documentation on the behalf of [name], NP,as directed by  [name], NP while in the presence of [name], NP.    [name], NP

## 2022-04-14 NOTE — Assessment & Plan Note (Signed)
BP Readings from Last 3 Encounters:  04/14/22 139/67  01/16/22 133/79  01/11/22 136/66   At goal on carvedilol and losartan.

## 2022-04-14 NOTE — Patient Instructions (Addendum)
Apply lotrimin over the counter twice daily for at least 2 week.  Schedule follow up with neurology for your headaches. Start chantix- let me know how it is going in about 3 weeks.

## 2022-04-14 NOTE — Assessment & Plan Note (Signed)
Lab Results  Component Value Date   CHOL 204 (H) 01/04/2022   HDL 48 01/04/2022   LDLCALC 138 (H) 01/04/2022   LDLDIRECT 132.0 04/12/2021   TRIG 89 01/04/2022   CHOLHDL 4.3 01/04/2022   On atorvastatin 80, was above goal last check, Recheck today.

## 2022-04-14 NOTE — Assessment & Plan Note (Signed)
New. Recommended lamisil bid x 2 weeks.

## 2022-04-15 LAB — COMPREHENSIVE METABOLIC PANEL
AG Ratio: 1.5 (calc) (ref 1.0–2.5)
ALT: 9 U/L (ref 6–29)
AST: 11 U/L (ref 10–35)
Albumin: 4.1 g/dL (ref 3.6–5.1)
Alkaline phosphatase (APISO): 158 U/L — ABNORMAL HIGH (ref 37–153)
BUN/Creatinine Ratio: 13 (calc) (ref 6–22)
BUN: 14 mg/dL (ref 7–25)
CO2: 26 mmol/L (ref 20–32)
Calcium: 9.4 mg/dL (ref 8.6–10.4)
Chloride: 107 mmol/L (ref 98–110)
Creat: 1.1 mg/dL — ABNORMAL HIGH (ref 0.50–1.05)
Globulin: 2.7 g/dL (calc) (ref 1.9–3.7)
Glucose, Bld: 106 mg/dL — ABNORMAL HIGH (ref 65–99)
Potassium: 4.4 mmol/L (ref 3.5–5.3)
Sodium: 143 mmol/L (ref 135–146)
Total Bilirubin: 0.3 mg/dL (ref 0.2–1.2)
Total Protein: 6.8 g/dL (ref 6.1–8.1)

## 2022-04-15 LAB — HEMOGLOBIN A1C
Hgb A1c MFr Bld: 5.8 % of total Hgb — ABNORMAL HIGH (ref <5.7)
Mean Plasma Glucose: 120 mg/dL
eAG (mmol/L): 6.6 mmol/L

## 2022-04-15 LAB — LIPID PANEL
Cholesterol: 166 mg/dL (ref <200)
HDL: 49 mg/dL — ABNORMAL LOW (ref >=50)
LDL Cholesterol (Calc): 93 mg/dL (calc)
Non-HDL Cholesterol (Calc): 117 mg/dL (calc) (ref <130)
Total CHOL/HDL Ratio: 3.4 (calc) (ref <5.0)
Triglycerides: 141 mg/dL (ref <150)

## 2022-04-19 ENCOUNTER — Telehealth: Payer: Self-pay | Admitting: Family

## 2022-04-19 NOTE — Telephone Encounter (Signed)
Please contact pt to schedule a lab appointment. I would like to check her vitamin D level based on her last lab results.

## 2022-04-19 NOTE — Telephone Encounter (Signed)
Called patient but no answer, lvm for her to call back and schedule lab appointment, order in as future.

## 2022-04-19 NOTE — Addendum Note (Signed)
Addended by: Jiles Prows on: 04/19/2022 11:54 AM   Modules accepted: Orders

## 2022-05-22 ENCOUNTER — Ambulatory Visit: Payer: BC Managed Care – PPO

## 2022-06-12 NOTE — Progress Notes (Signed)
Cardiology Office Note:    Date:  06/13/2022   ID:  Jaime Harris, DOB 06/12/1956, MRN [id number]  PCP:  Debbrah Alar, NP   [facility] Providers Cardiologist:  Lenna Sciara, MD Referring MD: Debbrah Alar, NP   Chief Complaint/Reason for Referral: Coronary artery disease  ASSESSMENT:    1. Coronary artery disease involving native coronary artery of native heart without angina pectoris   2. Carotid aneurysm, right ([place])   3. Primary hypertension   4. Hyperlipidemia LDL goal <70   5. Aortic atherosclerosis ([place])   6. Tobacco abuse     PLAN:    In order of problems listed above: 1.  Coronary artery disease: We will convert ticagrelor to Plavix without a load.  Stop aspirin in April 2024 and continue Plavix monotherapy indefinitely. 2.  Carotid aneurysm: This is being followed by neurosurgery 3.  Hypertension: 4.  Hyperlipidemia: Patient's LDL is not at goal; start Zetia 10 mg daily check lipid panel, LFTs, and LP(a) in 2 months.  If she is not under 70 will refer to pharmacy for further recommendations. 5.  Aortic atherosclerosis: Continue dual antiplatelet therapy, statin, goal blood pressure control. 6.  Tobacco abuse: We will obtain abdominal ultrasound to screen for AAA.             Dispo:  Return in about 6 months (around 12/13/2022).      Medication Adjustments/Labs and Tests Ordered: Current medicines are reviewed at length with the patient today.  Concerns regarding medicines are outlined above.  The following changes have been made:     Labs/tests ordered: Orders Placed This Encounter  Procedures   Lipoprotein A (LPA)   Lipid panel   Hepatic function panel   VAS [place] AAA DUPLEX    Medication Changes: Meds ordered this encounter  Medications   clopidogrel (PLAVIX) 75 MG tablet    Sig: Take 1 tablet (75 mg total) by mouth daily.    Dispense:  90 tablet    Refill:  3   ezetimibe (ZETIA) 10 MG tablet    Sig: Take 1 tablet (10 mg  total) by mouth daily.    Dispense:  90 tablet    Refill:  3     Current medicines are reviewed at length with the patient today.  The patient does not have concerns regarding medicines.   History of Present Illness:    FOCUSED PROBLEM LIST:   1.  Coronary artery disease status post PCI of right coronary artery April 2023 2.  Hypertension 3.  Hyperlipidemia  4.  Supraclinoid right internal carotid aneurysm followed by neurosurgery 5.  Sickle cell trait 6.  Ongoing tobacco use 7.  Aortic atherosclerosis on CT abdomen pelvis 2017  The patient is a 66 y.o. female with the indicated medical history here for follow-up.  The patient presented to [facility] with an acute coronary syndrome in April.  She underwent uncomplicated stenting of her right coronary artery.  She was discharged on dual antiplatelet therapy.  She was seen in follow-up in early May.  She was having some shortness of breath and there was a plan in place to change her to Plavix.  She was referred to cardiac rehabilitation.  Today: Patient had a lipid panel recently which demonstrated an LDL of 93.  She unfortunately did not participate in cardiac rehabilitation due to cost.  She still has some shortness of breath that she believes is related to Brilinta.  She is still smoking but she has  reduced the amount of cigarettes she uses in a day.  She denies any exertional angina, severe bleeding, myalgias, claudication, presyncope, or syncope.  She has had no signs or symptoms of stroke.  She otherwise feels very well.       Current Medications: Current Meds  Medication Sig   APPLE CIDER VINEGAR PO Take 1 capsule by mouth daily.   aspirin EC 81 MG tablet Take 1 tablet (81 mg total) by mouth daily. Swallow whole.   atorvastatin (LIPITOR) 80 MG tablet Take 1 tablet (80 mg total) by mouth at bedtime.   carvedilol (COREG) 6.25 MG tablet Take 1 tablet (6.25 mg total) by mouth 2 (two) times daily with a meal.   clopidogrel  (PLAVIX) 75 MG tablet Take 1 tablet (75 mg total) by mouth daily.   ezetimibe (ZETIA) 10 MG tablet Take 1 tablet (10 mg total) by mouth daily.   famotidine (PEPCID) 20 MG tablet Take 1 tablet (20 mg total) by mouth daily.   losartan (COZAAR) 25 MG tablet Take 1 tablet (25 mg total) by mouth daily.   Multiple Vitamin (MULTIVITAMIN) tablet Take 1 tablet by mouth daily.   nitroGLYCERIN (NITROSTAT) 0.4 MG SL tablet Place 1 tablet (0.4 mg total) under the tongue every 5 (five) minutes x 3 doses as needed for chest pain.   [DISCONTINUED] ticagrelor (BRILINTA) 90 MG TABS tablet Take 1 tablet (90 mg total) by mouth 2 (two) times daily.   [DISCONTINUED] topiramate (TOPAMAX) 50 MG tablet Take 1 tablet (50 mg total) by mouth 2 (two) times daily.     Allergies:    Patient has no known allergies.   Social History:   Social History   Tobacco Use   Smoking status: Every Day    Packs/day: 0.75    Types: Cigarettes    Passive exposure: Never   Smokeless tobacco: Never  Substance Use Topics   Alcohol use: Yes    Comment: occasional   Drug use: No     Family Hx: Family History  Problem Relation Age of Onset   Heart disease Mother        pacemaker/defibrillator   Diabetes Mother    Heart disease Paternal Grandmother        died of CHF   Diabetes Paternal Grandmother    Diabetes Brother    Cancer Neg Hx    Kidney disease Neg Hx    Colon cancer Neg Hx    Colon polyps Neg Hx    Rectal cancer Neg Hx    Stomach cancer Neg Hx      Review of Systems:   Please see the history of present illness.    All other systems reviewed and are negative.     EKGs/Labs/Other Test Reviewed:    EKG:  EKG performed May 2023 that I personally reviewed demonstrates sinus rhythm  Prior CV studies:  PCI 2023: The LAD is large caliber vessel that courses to the apex. There is mild non-obstructive plaque in the proximal LAD.  The Circumflex is a small to moderate caliber non-dominant vessel with mild  non-obstructive plaque.  The RCA is a tortuous, large, dominant vessel. The proximal RCA has mild non-obstructive plaque. The mid RCA has a focal, severe stenosis. RFR of this lesion is 0.89 suggesting flow limitation.  Successful PTCA/DES x 1 mid RCA Normal LV systolic function LVEDP 41-93 mmHg.   TTE 2023: 1. Mild hypokinesis of the basal to mid inferior and inferoseptal  myocardium. Left ventricular ejection fraction, by  estimation, is 55 to  60%. The left ventricle has normal function. The left ventricle  demonstrates regional wall motion abnormalities  (see scoring diagram/findings for description). There is mild concentric  left ventricular hypertrophy. Left ventricular diastolic parameters are  consistent with Grade I diastolic dysfunction (impaired relaxation).   2. Right ventricular systolic function is normal. The right ventricular  size is normal. There is normal pulmonary artery systolic pressure.   3. The mitral valve is normal in structure. Trivial mitral valve  regurgitation. No evidence of mitral stenosis.   4. The aortic valve is tricuspid. Aortic valve regurgitation is not  visualized. No aortic stenosis is present.   5. The inferior vena cava is normal in size with greater than 50%  respiratory variability, suggesting right atrial pressure of 3 mmHg.  Other studies Reviewed: Review of the additional studies/records demonstrates: CT abdomen pelvis 2017 with aortic atherosclerosis  Recent Labs: 01/03/2022: TSH 0.565 01/04/2022: Hemoglobin 12.6; Platelets 179 04/14/2022: ALT 9; BUN 14; Creat 1.10; Potassium 4.4; Sodium 143   Recent Lipid Panel Lab Results  Component Value Date/Time   CHOL 166 04/14/2022 03:21 PM   TRIG 141 04/14/2022 03:21 PM   HDL 49 (L) 04/14/2022 03:21 PM   LDLCALC 93 04/14/2022 03:21 PM   LDLDIRECT 132.0 04/12/2021 10:55 AM    Risk Assessment/Calculations:                Physical Exam:    VS:  BP 120/70   Pulse 83   Ht '5\' 5"'$  (1.651  m)   Wt 175 lb 9.6 oz (79.7 kg)   SpO2 98%   BMI 29.22 kg/m    Wt Readings from Last 3 Encounters:  06/13/22 175 lb 9.6 oz (79.7 kg)  04/14/22 175 lb (79.4 kg)  01/16/22 175 lb (79.4 kg)    GENERAL:  No apparent distress, AOx3 HEENT:  No carotid bruits, +2 carotid impulses, no scleral icterus CAR: RRR no murmurs, gallops, rubs, or thrills RES:  Clear to auscultation bilaterally ABD:  Soft, nontender, nondistended, positive bowel sounds x 4 VASC:  +2 radial pulses, +2 carotid pulses, palpable pedal pulses NEURO:  CN 2-12 grossly intact; motor and sensory grossly intact PSYCH:  No active depression or anxiety EXT:  No edema, ecchymosis, or cyanosis  Signed, Early Osmond, MD  06/13/2022 9:18 AM    [address], [place], [place]  [address] Phone: [phone]; Fax: [phone]   Note:  This document was prepared using Dragon voice recognition software and may include unintentional dictation errors.

## 2022-06-13 ENCOUNTER — Ambulatory Visit: Payer: BC Managed Care – PPO | Attending: Internal Medicine | Admitting: Internal Medicine

## 2022-06-13 ENCOUNTER — Encounter: Payer: Self-pay | Admitting: Internal Medicine

## 2022-06-13 VITALS — BP 120/70 | HR 83 | Ht 65.0 in | Wt 175.6 lb

## 2022-06-13 DIAGNOSIS — I10 Essential (primary) hypertension: Secondary | ICD-10-CM

## 2022-06-13 DIAGNOSIS — I720 Aneurysm of carotid artery: Secondary | ICD-10-CM | POA: Diagnosis not present

## 2022-06-13 DIAGNOSIS — I2510 Atherosclerotic heart disease of native coronary artery without angina pectoris: Secondary | ICD-10-CM | POA: Diagnosis not present

## 2022-06-13 DIAGNOSIS — E785 Hyperlipidemia, unspecified: Secondary | ICD-10-CM | POA: Diagnosis not present

## 2022-06-13 DIAGNOSIS — Z720 Tobacco use: Secondary | ICD-10-CM

## 2022-06-13 DIAGNOSIS — I700 Atherosclerosis of aorta: Secondary | ICD-10-CM

## 2022-06-13 MED ORDER — CLOPIDOGREL BISULFATE 75 MG PO TABS: 75.0000 mg | ORAL_TABLET | Freq: Every day | ORAL | 3 refills | Status: DC

## 2022-06-13 MED ORDER — EZETIMIBE 10 MG PO TABS
10.0000 mg | ORAL_TABLET | Freq: Every day | ORAL | 3 refills | Status: DC
Start: 2022-06-13 — End: 2023-02-13

## 2022-06-13 NOTE — Patient Instructions (Signed)
Medication Instructions:  Your physician has recommended you make the following change in your medication:  1.) stop Brilinta 2.) start clopidogrel (Plavix) 75 mg - one tablet daily 3.) start ezetimibe (Zetia) 10 mg - one tablet daily  *If you need a refill on your cardiac medications before your next appointment, please call your pharmacy*   Lab Work: Please return in 2 months for blood work  If you have labs (blood work) drawn today and your tests are completely normal, you will receive your results only by: [place] (if you have MyChart) OR A paper copy in the mail If you have any lab test that is abnormal or we need to change your treatment, we will call you to review the results.   Testing/Procedures: Your physician has requested that you have an abdominal aorta duplex. During this test, an ultrasound is used to evaluate the aorta. Allow 30 minutes for this exam. Do not eat after midnight the day before and avoid carbonated beverages   Follow-Up: At [facility], you and your health needs are our priority.  As part of our continuing mission to provide you with exceptional heart care, we have created designated Provider Care Teams.  These Care Teams include your primary Cardiologist (physician) and Advanced Practice Providers (APPs -  Physician Assistants and Nurse Practitioners) who all work together to provide you with the care you need, when you need it.   Your next appointment:   6 month(s)  The format for your next appointment:   In Person  Provider:   Advanced Practice Provider    Important Information About Sugar

## 2022-06-15 ENCOUNTER — Ambulatory Visit (HOSPITAL_COMMUNITY)
Admission: RE | Admit: 2022-06-15 | Discharge: 2022-06-15 | Disposition: A | Discharge status: Home / Self Care | Payer: BC Managed Care – PPO | Source: Ambulatory Visit | Attending: Cardiology | Admitting: Cardiology

## 2022-06-15 DIAGNOSIS — I700 Atherosclerosis of aorta: Secondary | ICD-10-CM

## 2022-06-15 DIAGNOSIS — E785 Hyperlipidemia, unspecified: Secondary | ICD-10-CM | POA: Diagnosis not present

## 2022-06-15 DIAGNOSIS — I720 Aneurysm of carotid artery: Secondary | ICD-10-CM

## 2022-06-15 DIAGNOSIS — I2510 Atherosclerotic heart disease of native coronary artery without angina pectoris: Secondary | ICD-10-CM

## 2022-06-15 DIAGNOSIS — Z720 Tobacco use: Secondary | ICD-10-CM | POA: Diagnosis not present

## 2022-06-15 DIAGNOSIS — I10 Essential (primary) hypertension: Secondary | ICD-10-CM | POA: Diagnosis not present

## 2022-06-16 ENCOUNTER — Ambulatory Visit
Admission: RE | Admit: 2022-06-16 | Discharge: 2022-06-16 | Disposition: A | Discharge status: Home / Self Care | Payer: BC Managed Care – PPO | Source: Ambulatory Visit | Attending: Family | Admitting: Family

## 2022-06-16 DIAGNOSIS — Z1231 Encounter for screening mammogram for malignant neoplasm of breast: Secondary | ICD-10-CM | POA: Diagnosis not present

## 2022-08-11 ENCOUNTER — Other Ambulatory Visit: Payer: BC Managed Care – PPO

## 2022-08-14 ENCOUNTER — Encounter: Payer: Self-pay | Admitting: Gastroenterology

## 2022-08-14 ENCOUNTER — Encounter: Payer: Self-pay | Admitting: Family

## 2022-08-14 ENCOUNTER — Ambulatory Visit (INDEPENDENT_AMBULATORY_CARE_PROVIDER_SITE_OTHER): Payer: BC Managed Care – PPO | Admitting: Family

## 2022-08-14 ENCOUNTER — Other Ambulatory Visit (HOSPITAL_BASED_OUTPATIENT_CLINIC_OR_DEPARTMENT_OTHER): Payer: Self-pay

## 2022-08-14 VITALS — BP 139/82 | HR 85 | Temp 97.9°F | Resp 16 | Wt 176.0 lb

## 2022-08-14 DIAGNOSIS — M5416 Radiculopathy, lumbar region: Secondary | ICD-10-CM | POA: Diagnosis not present

## 2022-08-14 DIAGNOSIS — I10 Essential (primary) hypertension: Secondary | ICD-10-CM | POA: Diagnosis not present

## 2022-08-14 DIAGNOSIS — Z23 Encounter for immunization: Secondary | ICD-10-CM | POA: Diagnosis not present

## 2022-08-14 DIAGNOSIS — Z1211 Encounter for screening for malignant neoplasm of colon: Secondary | ICD-10-CM

## 2022-08-14 DIAGNOSIS — K219 Gastro-esophageal reflux disease without esophagitis: Secondary | ICD-10-CM | POA: Diagnosis not present

## 2022-08-14 DIAGNOSIS — I2510 Atherosclerotic heart disease of native coronary artery without angina pectoris: Secondary | ICD-10-CM

## 2022-08-14 DIAGNOSIS — K589 Irritable bowel syndrome without diarrhea: Secondary | ICD-10-CM | POA: Diagnosis not present

## 2022-08-14 DIAGNOSIS — E785 Hyperlipidemia, unspecified: Secondary | ICD-10-CM

## 2022-08-14 DIAGNOSIS — N3281 Overactive bladder: Secondary | ICD-10-CM

## 2022-08-14 DIAGNOSIS — E559 Vitamin D deficiency, unspecified: Secondary | ICD-10-CM

## 2022-08-14 DIAGNOSIS — Z720 Tobacco use: Secondary | ICD-10-CM

## 2022-08-14 LAB — BASIC METABOLIC PANEL
BUN: 13 mg/dL (ref 6–23)
CO2: 28 mEq/L (ref 19–32)
Calcium: 9.3 mg/dL (ref 8.4–10.5)
Chloride: 105 mEq/L (ref 96–112)
Creatinine, Ser: 0.86 mg/dL (ref 0.40–1.20)
GFR: 70.4 mL/min (ref >60.00)
Glucose, Bld: 90 mg/dL (ref 70–99)
Potassium: 4 mEq/L (ref 3.5–5.1)
Sodium: 141 mEq/L (ref 135–145)

## 2022-08-14 MED ORDER — VARENICLINE TARTRATE (STARTER) 0.5 MG X 11 & 1 MG X 42 PO TBPK
ORAL_TABLET | ORAL | 0 refills | Status: DC
  Filled 2022-08-14: qty 53, 28d supply, fill #0

## 2022-08-14 NOTE — Assessment & Plan Note (Signed)
Continues multivitamin.

## 2022-08-14 NOTE — Assessment & Plan Note (Addendum)
Smoking 10 cigarettes/day.  She is interested in quitting. Wishes to retry chantix.

## 2022-08-14 NOTE — Assessment & Plan Note (Signed)
Probiotics have helped in the past. Encouraged her to restart probiotics, increase water/fruits/veggies.

## 2022-08-14 NOTE — Assessment & Plan Note (Signed)
Lab Results  Component Value Date   CHOL 166 04/14/2022   HDL 49 (L) 04/14/2022   LDLCALC 93 04/14/2022   LDLDIRECT 132.0 04/12/2021   TRIG 141 04/14/2022   CHOLHDL 3.4 04/14/2022  LDL at goal, continues atorvastatin.

## 2022-08-14 NOTE — Assessment & Plan Note (Signed)
Intermittent frequency.  Overall manageable without medications. Monitor.

## 2022-08-14 NOTE — Assessment & Plan Note (Signed)
BP Readings from Last 3 Encounters:  08/14/22 139/82  06/13/22 120/70  04/14/22 139/67   Fair bp to day on losartan '25mg'$ /carvediol. Continue same.

## 2022-08-14 NOTE — Progress Notes (Signed)
Subjective:   By signing my name below, I, Jaime Harris, attest that this documentation has been prepared under the direction and in the presence of Debbrah Alar, 08/14/2022.   Patient ID: Jaime Harris, female    DOB: 1956-08-31, 66 y.o.   MRN: [id number]  Chief Complaint  Patient presents with   Hypertension    Here for follow up    HPI Patient is in today for an office visit.  Elevated blood pressure Patients blood pressure is normal this visit. She is complaint with her Cozaar 25 mg and Coreg .25 mg.  BP Readings from Last 3 Encounters:  08/14/22 139/82  06/13/22 120/70  04/14/22 139/67   Pulse Readings from Last 3 Encounters:  08/14/22 85  06/13/22 83  04/14/22 95   Acid reflux Patient reports that she uses her 20 mg Pepcid prn with no concerns.  Bowel movements Patient states that she does not move her bowels like she should. She states that her bowel move 4-5 days on a good week. She reports that she toke probiotics pills and apple cider vinegar supplements with relief.  She states that she has occasional urinary frequency problems, but states that it is manageable with no concerns.   Back pain Patient states that she occasionally experiences lower back pain, but overall improved.  Smoking Patient reports that she was previously prescribed Chantix and used it for 2 weeks then stopped. She expresses that the medication did help curb smoking frequency. She is ready to be put back on medication.  Hyperlipidemia  Patient is complaint with her Lipitor 80 mg.  Immunizations She is receiving an influenza vaccine this visit.  Health Maintenance Due  Topic Date Due   Zoster Vaccines- Shingrix (1 of 2) Never done   COLONOSCOPY (Pts 45-39yr Insurance coverage will need to be confirmed)  02/01/2020   INFLUENZA VACCINE  04/11/2022   COVID-19 Vaccine (2 - 2023-24 season) 05/12/2022    Past Medical History:  Diagnosis Date   Aneurysm ([facility] 2020   supraclinoid  R int carotid artery   CAD (coronary artery disease)    LHC 01/03/2022: 30% of pRCA followed by 80% of mRCA s/p DES, 20% of pLAD, &30% of p-mLCX   Chronic tension headaches    Headache    Hyperlipidemia    borderline   Non-ST elevation (NSTEMI) myocardial infarction [place] [place] 01/03/2022   [place]    Thyroid disease    enlarged thyroid   Vertigo    Vitamin D deficiency     Past Surgical History:  Procedure Laterality Date   CORONARY STENT INTERVENTION N/A 01/03/2022   Procedure: CORONARY STENT INTERVENTION;  Surgeon: MBurnell Blanks MD;  [facility];  Service: Cardiovascular;  Laterality: N/A;   FOOT SURGERY Bilateral    Pt states she had bones removed from the sides of her feet and middle toes so she could walk better.   INTRAVASCULAR PRESSURE WIRE/FFR STUDY N/A 01/03/2022   Procedure: INTRAVASCULAR PRESSURE WIRE/FFR STUDY;  Surgeon: MBurnell Blanks MD;  [facility];  Service: Cardiovascular;  Laterality: N/A;   LEFT HEART CATH AND CORONARY ANGIOGRAPHY N/A 01/03/2022   Procedure: LEFT HEART CATH AND CORONARY ANGIOGRAPHY;  Surgeon: MBurnell Blanks MD;  [facility];  Service: Cardiovascular;  Laterality: N/A;   TONSILLECTOMY  1975   TUBAL LIGATION      Family History  Problem Relation Age of Onset   Heart disease Mother  pacemaker/defibrillator   Diabetes Mother    Heart disease Paternal Grandmother        died of CHF   Diabetes Paternal Grandmother    Diabetes Brother    Cancer Neg Hx    Kidney disease Neg Hx    Colon cancer Neg Hx    Colon polyps Neg Hx    Rectal cancer Neg Hx    Stomach cancer Neg Hx     Social History   Socioeconomic History   Marital status: Single    Spouse name: Not on file   Number of children: 3   Years of education: Not on file   Highest education level: High school graduate  Occupational History    Comment: Scientist, water quality  Tobacco Use   Smoking  status: Every Day    Packs/day: 0.75    Types: Cigarettes    Passive exposure: Never   Smokeless tobacco: Never  Substance and Sexual Activity   Alcohol use: Yes    Comment: occasional   Drug use: No   Sexual activity: Not on file  Other Topics Concern   Not on file  Social History Narrative   Makes eye glasses.     Single   GED   She has 3 children   17- Son [name]- lives locally- he has one son   35- Daughter [name]- 7 children (4 of these children live with the pt- she is a primary care giver)   Dwight- lives in [place] 1 daughter   Enjoys reading   Caffeine- coffee, 4 cups daily, soda every other day   Social Determinants of Health   Financial Resource Strain: Not on file  Food Insecurity: Not on file  Transportation Needs: Not on file  Physical Activity: Not on file  Stress: Not on file  Social Connections: Not on file  Intimate Partner Violence: Not on file    Outpatient Medications Prior to Visit  Medication Sig Dispense Refill   APPLE CIDER VINEGAR PO Take 1 capsule by mouth daily.     aspirin EC 81 MG tablet Take 1 tablet (81 mg total) by mouth daily. Swallow whole. 30 tablet 11   atorvastatin (LIPITOR) 80 MG tablet Take 1 tablet (80 mg total) by mouth at bedtime. 30 tablet 5   carvedilol (COREG) 6.25 MG tablet Take 1 tablet (6.25 mg total) by mouth 2 (two) times daily with a meal. 60 tablet 2   clopidogrel (PLAVIX) 75 MG tablet Take 1 tablet (75 mg total) by mouth daily. 90 tablet 3   ezetimibe (ZETIA) 10 MG tablet Take 1 tablet (10 mg total) by mouth daily. 90 tablet 3   famotidine (PEPCID) 20 MG tablet Take 1 tablet (20 mg total) by mouth daily. 30 tablet 2   losartan (COZAAR) 25 MG tablet Take 1 tablet (25 mg total) by mouth daily. 30 tablet 2   Multiple Vitamin (MULTIVITAMIN) tablet Take 1 tablet by mouth daily.     nitroGLYCERIN (NITROSTAT) 0.4 MG SL tablet Place 1 tablet (0.4 mg total) under the tongue every 5 (five) minutes x 3 doses as  needed for chest pain. 25 tablet 2   No facility-administered medications prior to visit.    No Known Allergies  Review of Systems  Genitourinary:  Positive for frequency.       Objective:    Physical Exam Constitutional:      General: She is not in acute distress.    Appearance: Normal appearance. She is not ill-appearing.  HENT:  Head: Normocephalic and atraumatic.     Right Ear: External ear normal.     Left Ear: External ear normal.  Eyes:     Extraocular Movements: Extraocular movements intact.     Pupils: Pupils are equal, round, and reactive to light.  Cardiovascular:     Rate and Rhythm: Normal rate and regular rhythm.     Heart sounds: Normal heart sounds. No murmur heard.    No gallop.  Pulmonary:     Effort: Pulmonary effort is normal. No respiratory distress.     Breath sounds: Normal breath sounds. No wheezing or rales.  Skin:    General: Skin is warm and dry.  Neurological:     Mental Status: She is alert and oriented to person, place, and time.  Psychiatric:        Mood and Affect: Mood normal.        Behavior: Behavior normal.        Judgment: Judgment normal.     BP 139/82 (BP Location: Right Arm, Patient Position: Sitting, Cuff Size: Small)   Pulse 85   Temp 97.9 F (36.6 C) (Oral)   Resp 16   Wt 176 lb (79.8 kg)   SpO2 98%   BMI 29.29 kg/m  Wt Readings from Last 3 Encounters:  08/14/22 176 lb (79.8 kg)  06/13/22 175 lb 9.6 oz (79.7 kg)  04/14/22 175 lb (79.4 kg)       Assessment & Plan:   Problem List Items Addressed This Visit       Unprioritized   Vitamin D deficiency    Continues multivitamin.        Tobacco abuse    Smoking 10 cigarettes/day.  She is interested in quitting. Wishes to retry chantix.      Overactive bladder    Intermittent frequency.  Overall manageable without medications. Monitor.       Lumbar radiculopathy    Improved. Monitor.       Relevant Medications   Varenicline Tartrate, Starter,  (CHANTIX STARTING MONTH PAK) 0.5 MG X 11 & 1 MG X 42 TBPK   IBS (irritable bowel syndrome)    Probiotics have helped in the past. Encouraged her to restart probiotics, increase water/fruits/veggies.       Hyperlipidemia    Lab Results  Component Value Date   CHOL 166 04/14/2022   HDL 49 (L) 04/14/2022   LDLCALC 93 04/14/2022   LDLDIRECT 132.0 04/12/2021   TRIG 141 04/14/2022   CHOLHDL 3.4 04/14/2022  LDL at goal, continues atorvastatin.        Gastroesophageal reflux disease    Stable with prn use of pepcid.        Essential hypertension - Primary    BP Readings from Last 3 Encounters:  08/14/22 139/82  06/13/22 120/70  04/14/22 139/67  Fair bp to day on losartan '25mg'$ /carvediol. Continue same.       Relevant Orders   Basic metabolic panel   CAD (coronary artery disease)    Followed by cardiology. Maintained on statin, plavix, aspirin, beta blocker.       Other Visit Diagnoses     Colon cancer screening       Relevant Orders   Ambulatory referral to Gastroenterology      High dose flu shot today.  Meds ordered this encounter  Medications   Varenicline Tartrate, Starter, (CHANTIX STARTING MONTH PAK) 0.5 MG X 11 & 1 MG X 42 TBPK    Sig: Take by mouth  per  package instructions    Dispense:  53 each    Refill:  0    Order Specific Question:   Supervising Provider    Answer:   Mosie Lukes [[id number]]    I, Debbrah Alar, personally preformed the services described in this documentation.  All medical record entries made by the scribe were at my direction and in my presence.  I have reviewed the chart and discharge instructions (if applicable) and agree that the record reflects my personal performance and is accurate and complete. 08/14/2022.   I,Verona Buck,acting as a Education administrator for [employer], NP.,have documented all relevant documentation on the behalf of [name], NP,as directed by  [name], NP while in the presence of [name], NP.    [name], NP

## 2022-08-14 NOTE — Assessment & Plan Note (Signed)
Stable with prn use of pepcid.

## 2022-08-14 NOTE — Assessment & Plan Note (Signed)
Followed by cardiology. Maintained on statin, plavix, aspirin, beta blocker.

## 2022-08-14 NOTE — Assessment & Plan Note (Signed)
Improved. Monitor.  

## 2022-08-15 ENCOUNTER — Ambulatory Visit: Payer: BC Managed Care – PPO | Attending: Internal Medicine

## 2022-08-15 DIAGNOSIS — E785 Hyperlipidemia, unspecified: Secondary | ICD-10-CM

## 2022-08-15 DIAGNOSIS — Z720 Tobacco use: Secondary | ICD-10-CM

## 2022-08-15 DIAGNOSIS — I720 Aneurysm of carotid artery: Secondary | ICD-10-CM | POA: Diagnosis not present

## 2022-08-15 DIAGNOSIS — I10 Essential (primary) hypertension: Secondary | ICD-10-CM

## 2022-08-15 DIAGNOSIS — I700 Atherosclerosis of aorta: Secondary | ICD-10-CM

## 2022-08-15 DIAGNOSIS — I2510 Atherosclerotic heart disease of native coronary artery without angina pectoris: Secondary | ICD-10-CM | POA: Diagnosis not present

## 2022-08-16 LAB — LIPID PANEL
Chol/HDL Ratio: 4.4 ratio (ref 0.0–4.4)
Cholesterol, Total: 219 mg/dL — ABNORMAL HIGH (ref 100–199)
HDL: 50 mg/dL (ref >39)
LDL Chol Calc (NIH): 145 mg/dL — ABNORMAL HIGH (ref 0–99)
Triglycerides: 133 mg/dL (ref 0–149)
VLDL Cholesterol Cal: 24 mg/dL (ref 5–40)

## 2022-08-16 LAB — HEPATIC FUNCTION PANEL
ALT: 12 IU/L (ref 0–32)
AST: 12 IU/L (ref 0–40)
Albumin: 4.5 g/dL (ref 3.9–4.9)
Alkaline Phosphatase: 156 IU/L — ABNORMAL HIGH (ref 44–121)
Bilirubin Total: 0.2 mg/dL (ref 0.0–1.2)
Bilirubin, Direct: <0.1 mg/dL (ref 0.00–0.40)
Total Protein: 7 g/dL (ref 6.0–8.5)

## 2022-08-16 LAB — LIPOPROTEIN A (LPA): Lipoprotein (a): 78.6 nmol/L — ABNORMAL HIGH (ref <75.0)

## 2022-08-21 ENCOUNTER — Other Ambulatory Visit: Payer: Self-pay | Admitting: *Deleted

## 2022-08-21 DIAGNOSIS — E785 Hyperlipidemia, unspecified: Secondary | ICD-10-CM

## 2022-08-21 DIAGNOSIS — I2510 Atherosclerotic heart disease of native coronary artery without angina pectoris: Secondary | ICD-10-CM

## 2022-08-21 NOTE — Progress Notes (Signed)
A referral to Clinical Pharmacy Team placed.

## 2022-08-21 NOTE — Progress Notes (Signed)
Referral to PharmD placed.

## 2022-09-18 ENCOUNTER — Encounter: Payer: Self-pay | Admitting: Neurology

## 2022-09-18 ENCOUNTER — Telehealth: Payer: Self-pay | Admitting: Neurology

## 2022-09-18 NOTE — Telephone Encounter (Signed)
Unable to reach pt by phone, sent mychart msg and letter advising her of r/s needed for 5/8 appt- NP out.

## 2022-09-26 ENCOUNTER — Telehealth: Payer: Self-pay

## 2022-09-26 NOTE — Telephone Encounter (Signed)
Patient scheduled for colonoscopy with Dr. Rush Landmark on Friday 11/06/22. Needing medical clearance for Plavix hold. Please reach out to prescribing MD.    Thank you,  Gay Filler

## 2022-09-26 NOTE — Telephone Encounter (Signed)
   Patient Name: HARLEYQUINN GASSER  DOB: February 11, 1956 MRN: [id number]  Primary Cardiologist: Early Osmond, MD  Chart reviewed as part of pre-operative protocol coverage. Given past medical history and time since last visit, based on ACC/AHA guidelines, VIRTIE BUNGERT is at acceptable risk for the planned procedure without further cardiovascular testing.   Per our protocol patient can hold Plavix 5 days prior to colonoscopy and should continue ASA 81 mg through perioperative period due to history of RCA stent in 12/2021.  Please call with any questions.  I will route this recommendation to the requesting party via Epic fax function and remove from pre-op pool.  Please call with questions.  Mable Fill, Marissa Nestle, NP 09/26/2022, 2:52 PM

## 2022-09-26 NOTE — Telephone Encounter (Signed)
[facility] Pre-operative Risk Assessment     Request for surgical clearance:     Endoscopy Procedure  What type of surgery is being performed?     Colon  When is this surgery scheduled?     2/26  What type of clearance is required ?   Pharmacy  Are there any medications that need to be held prior to surgery and how long? Plavix  Practice name and name of physician performing surgery?      [facility]  What is your office phone and fax number?      Phone- [phone]  [phone]  Anesthesia type (None, local, MAC, general) ?       MAC

## 2022-10-06 ENCOUNTER — Ambulatory Visit (AMBULATORY_SURGERY_CENTER): Payer: Self-pay

## 2022-10-06 VITALS — Ht 65.0 in | Wt 177.0 lb

## 2022-10-06 DIAGNOSIS — Z86010 Personal history of colonic polyps: Secondary | ICD-10-CM

## 2022-10-06 NOTE — Progress Notes (Signed)
No egg or soy allergy known to patient  No issues known to pt with past sedation with any surgeries or procedures Patient denies ever being told they had issues or difficulty with intubation  No FH of Malignant Hyperthermia Pt is not on diet pills Pt is not on  home 02  Pt is not on blood thinners  Pt denies issues with constipation  No A fib or A flutter Have any cardiac testing pending--no Pt instructed to use [email or web address] or GoodRx for a price reduction on prep   

## 2022-10-17 ENCOUNTER — Encounter: Payer: Self-pay | Admitting: Gastroenterology

## 2022-10-18 ENCOUNTER — Telehealth: Payer: Self-pay | Admitting: Gastroenterology

## 2022-10-18 NOTE — Telephone Encounter (Signed)
Spoke with pt and told her I will send new prep instructions via [place] to her.   Also, noted that Plavix is on her medication list.  She told me that she has stopped Plavix herself and continues on her Aspirin 81 mg daily.  She states her cardiologist is aware of this.   Dr. Rush Landmark, Just an FYI about that the pt stopped her Plavix per self but stated that cardiologist is aware.  Thanks, [employer]

## 2022-10-18 NOTE — Telephone Encounter (Signed)
Inbound call from patient, states she needs new instructions for her procedure. The instructions that she was given had the incorrect dates and times. Patient has colonoscopy with Dr. Rush Landmark on 2/16.

## 2022-10-18 NOTE — Telephone Encounter (Signed)
Spoke with patient regarding new prep instructions.  Sent in My Chart and Mail.

## 2022-10-27 ENCOUNTER — Encounter: Payer: Self-pay | Admitting: Gastroenterology

## 2022-10-27 ENCOUNTER — Ambulatory Visit (AMBULATORY_SURGERY_CENTER): Payer: BC Managed Care – PPO | Admitting: Gastroenterology

## 2022-10-27 VITALS — BP 126/52 | HR 79 | Temp 98.9°F | Resp 15 | Ht 61.0 in | Wt 177.0 lb

## 2022-10-27 DIAGNOSIS — Z09 Encounter for follow-up examination after completed treatment for conditions other than malignant neoplasm: Secondary | ICD-10-CM

## 2022-10-27 DIAGNOSIS — Z86010 Personal history of colonic polyps: Secondary | ICD-10-CM | POA: Diagnosis not present

## 2022-10-27 DIAGNOSIS — D123 Benign neoplasm of transverse colon: Secondary | ICD-10-CM

## 2022-10-27 DIAGNOSIS — D125 Benign neoplasm of sigmoid colon: Secondary | ICD-10-CM

## 2022-10-27 HISTORY — PX: COLONOSCOPY WITH PROPOFOL: SHX5780

## 2022-10-27 MED ORDER — SODIUM CHLORIDE 0.9 % IV SOLN: 500.0000 mL | Freq: Once | INTRAVENOUS | Status: DC

## 2022-10-27 NOTE — Progress Notes (Unsigned)
Stable to recovery, report given to RN

## 2022-10-27 NOTE — Patient Instructions (Addendum)
Recommendation:           - The patient will be observed post-procedure,                            until all discharge criteria are met.                           - Discharge patient to home.                           - Patient has a contact number available for                            emergencies. The signs and symptoms of potential                            delayed complications were discussed with the                            patient. Return to normal activities tomorrow.                            Written discharge instructions were provided to the                            patient.                           - High fiber diet.                           - Use FiberCon 1-2 tablets PO daily.                           - Although patient is not taking Plavix currently,                            if she needs to restart this she could in 48 hours.                            Can continue current aspirin dosing.                           - Continue present medications.                           - The findings and recommendations were discussed                            with the patient.                           - The findings and recommendations were discussed                            with the  patient's family.  Handouts on polyps, diverticulosis and hemorrhoids given.   YOU HAD AN ENDOSCOPIC PROCEDURE TODAY AT [facility]:   Refer to the procedure report that was given to you for any specific questions about what was found during the examination.  If the procedure report does not answer your questions, please call your gastroenterologist to clarify.  If you requested that your care partner not be given the details of your procedure findings, then the procedure report has been included in a sealed envelope for you to review at your convenience later.  YOU SHOULD EXPECT: Some feelings of bloating in the abdomen. Passage of more gas than usual.  Walking can help get rid  of the air that was put into your GI tract during the procedure and reduce the bloating. If you had a lower endoscopy (such as a colonoscopy or flexible sigmoidoscopy) you may notice spotting of blood in your stool or on the toilet paper. If you underwent a bowel prep for your procedure, you may not have a normal bowel movement for a few days.  Please Note:  You might notice some irritation and congestion in your nose or some drainage.  This is from the oxygen used during your procedure.  There is no need for concern and it should clear up in a day or so.  SYMPTOMS TO REPORT IMMEDIATELY:  Following lower endoscopy (colonoscopy or flexible sigmoidoscopy):  Excessive amounts of blood in the stool  Significant tenderness or worsening of abdominal pains  Swelling of the abdomen that is new, acute  Fever of 100F or higher  For urgent or emergent issues, a gastroenterologist can be reached at any hour by calling [phone]. Do not use MyChart messaging for urgent concerns.   DIET:  We do recommend a small meal at first, but then you may proceed to your regular diet.  Drink plenty of fluids but you should avoid alcoholic beverages for 24 hours.  ACTIVITY:  You should plan to take it easy for the rest of today and you should NOT DRIVE or use heavy machinery until tomorrow (because of the sedation medicines used during the test).    FOLLOW UP: Our staff will call the number listed on your records the next business day following your procedure.  We will call around 7:15- 8:00 am to check on you and address any questions or concerns that you may have regarding the information given to you following your procedure. If we do not reach you, we will leave a message.     If any biopsies were taken you will be contacted by phone or by letter within the next 1-3 weeks.  Please call us at [phone] if you have not heard about the biopsies in 3 weeks.   SIGNATURES/CONFIDENTIALITY: You and/or your care  partner have signed paperwork which will be entered into your electronic medical record.  These signatures attest to the fact that that the information above on your After Visit Summary has been reviewed and is understood.  Full responsibility of the confidentiality of this discharge information lies with you and/or your care-partner.

## 2022-10-27 NOTE — Progress Notes (Signed)
Called to room to assist during endoscopic procedure.  Patient ID and intended procedure confirmed with present staff. Received instructions for my participation in the procedure from the performing physician.  

## 2022-10-27 NOTE — Progress Notes (Unsigned)
GASTROENTEROLOGY PROCEDURE H&P NOTE   Primary Care Physician: Debbrah Alar, NP  HPI: Jaime Harris is a 67 y.o. female who presents for Colonoscopy for surveillance.  Past Medical History:  Diagnosis Date   Aneurysm ([place]) 2020   supraclinoid R int carotid artery   CAD (coronary artery disease)    LHC 01/03/2022: 30% of pRCA followed by 80% of mRCA s/p DES, 20% of pLAD, &30% of p-mLCX   Chronic tension headaches    Headache    Hyperlipidemia    borderline   Non-ST elevation (NSTEMI) myocardial infarction ([place]) 01/03/2022   Sickle cell trait ([place])    Thyroid disease    enlarged thyroid   Vertigo    Vitamin D deficiency    Past Surgical History:  Procedure Laterality Date   CORONARY STENT INTERVENTION N/A 01/03/2022   Procedure: CORONARY STENT INTERVENTION;  Surgeon: Burnell Blanks, MD;  Location: [facility];  Service: Cardiovascular;  Laterality: N/A;   FOOT SURGERY Bilateral    Pt states she had bones removed from the sides of her feet and middle toes so she could walk better.   INTRAVASCULAR PRESSURE WIRE/FFR STUDY N/A 01/03/2022   Procedure: INTRAVASCULAR PRESSURE WIRE/FFR STUDY;  Surgeon: Burnell Blanks, MD;  Location: [facility];  Service: Cardiovascular;  Laterality: N/A;   LEFT HEART CATH AND CORONARY ANGIOGRAPHY N/A 01/03/2022   Procedure: LEFT HEART CATH AND CORONARY ANGIOGRAPHY;  Surgeon: Burnell Blanks, MD;  Location: [facility];  Service: Cardiovascular;  Laterality: N/A;   TONSILLECTOMY  1975   TUBAL LIGATION     Current Outpatient Medications  Medication Sig Dispense Refill   APPLE CIDER VINEGAR PO Take 1 capsule by mouth daily.     aspirin EC 81 MG tablet Take 1 tablet (81 mg total) by mouth daily. Swallow whole. 30 tablet 11   atorvastatin (LIPITOR) 80 MG tablet Take 1 tablet (80 mg total) by mouth at bedtime. (Patient not taking: Reported on 10/06/2022) 30 tablet 5   carvedilol (COREG) 6.25 MG tablet  Take 1 tablet (6.25 mg total) by mouth 2 (two) times daily with a meal. (Patient not taking: Reported on 10/06/2022) 60 tablet 2   clopidogrel (PLAVIX) 75 MG tablet Take 1 tablet (75 mg total) by mouth daily. (Patient not taking: Reported on 10/06/2022) 90 tablet 3   ezetimibe (ZETIA) 10 MG tablet Take 1 tablet (10 mg total) by mouth daily. 90 tablet 3   famotidine (PEPCID) 20 MG tablet Take 1 tablet (20 mg total) by mouth daily. 30 tablet 2   losartan (COZAAR) 25 MG tablet Take 1 tablet (25 mg total) by mouth daily. (Patient not taking: Reported on 10/06/2022) 30 tablet 2   Multiple Vitamin (MULTIVITAMIN) tablet Take 1 tablet by mouth daily.     nitroGLYCERIN (NITROSTAT) 0.4 MG SL tablet Place 1 tablet (0.4 mg total) under the tongue every 5 (five) minutes x 3 doses as needed for chest pain. (Patient not taking: Reported on 10/06/2022) 25 tablet 2   Varenicline Tartrate, Starter, (CHANTIX STARTING MONTH PAK) 0.5 MG X 11 & 1 MG X 42 TBPK Take by mouth  per package instructions 53 each 0   No current facility-administered medications for this visit.    Current Outpatient Medications:    APPLE CIDER VINEGAR PO, Take 1 capsule by mouth daily., Disp: , Rfl:    aspirin EC 81 MG tablet, Take 1 tablet (81 mg total) by mouth daily. Swallow whole., Disp: 30  tablet, Rfl: 11   atorvastatin (LIPITOR) 80 MG tablet, Take 1 tablet (80 mg total) by mouth at bedtime. (Patient not taking: Reported on 10/06/2022), Disp: 30 tablet, Rfl: 5   carvedilol (COREG) 6.25 MG tablet, Take 1 tablet (6.25 mg total) by mouth 2 (two) times daily with a meal. (Patient not taking: Reported on 10/06/2022), Disp: 60 tablet, Rfl: 2   clopidogrel (PLAVIX) 75 MG tablet, Take 1 tablet (75 mg total) by mouth daily. (Patient not taking: Reported on 10/06/2022), Disp: 90 tablet, Rfl: 3   ezetimibe (ZETIA) 10 MG tablet, Take 1 tablet (10 mg total) by mouth daily., Disp: 90 tablet, Rfl: 3   famotidine (PEPCID) 20 MG tablet, Take 1 tablet (20 mg  total) by mouth daily., Disp: 30 tablet, Rfl: 2   losartan (COZAAR) 25 MG tablet, Take 1 tablet (25 mg total) by mouth daily. (Patient not taking: Reported on 10/06/2022), Disp: 30 tablet, Rfl: 2   Multiple Vitamin (MULTIVITAMIN) tablet, Take 1 tablet by mouth daily., Disp: , Rfl:    nitroGLYCERIN (NITROSTAT) 0.4 MG SL tablet, Place 1 tablet (0.4 mg total) under the tongue every 5 (five) minutes x 3 doses as needed for chest pain. (Patient not taking: Reported on 10/06/2022), Disp: 25 tablet, Rfl: 2   Varenicline Tartrate, Starter, (CHANTIX STARTING MONTH PAK) 0.5 MG X 11 & 1 MG X 42 TBPK, Take by mouth  per package instructions, Disp: 53 each, Rfl: 0 No Known Allergies Family History  Problem Relation Age of Onset   Heart disease Mother        pacemaker/defibrillator   Diabetes Mother    Diabetes Brother    Heart disease Paternal Grandmother        died of CHF   Diabetes Paternal Grandmother    Cancer Neg Hx    Kidney disease Neg Hx    Colon cancer Neg Hx    Colon polyps Neg Hx    Rectal cancer Neg Hx    Stomach cancer Neg Hx    Esophageal cancer Neg Hx    Social History   Socioeconomic History   Marital status: Single    Spouse name: Not on file   Number of children: 3   Years of education: Not on file   Highest education level: High school graduate  Occupational History    Comment: Scientist, water quality  Tobacco Use   Smoking status: Every Day    Packs/day: 0.75    Types: Cigarettes    Passive exposure: Never   Smokeless tobacco: Never  Substance and Sexual Activity   Alcohol use: Yes    Comment: occasional   Drug use: No   Sexual activity: Not on file  Other Topics Concern   Not on file  Social History Narrative   Makes eye glasses.     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 There is no height or weight on file to calculate BMI. GEN: NAD EYE: Sclerae anicteric ENT: MMM CV: Non-tachycardic GI: Soft, NT/ND NEURO:  Alert & Oriented x 3  Lab Results: No results for input(s): "WBC", "HGB", "HCT", "PLT" in the last 72 hours. BMET No results for input(s): "NA", "K", "CL", "CO2", "GLUCOSE", "BUN", "CREATININE", "CALCIUM" in the last 72 hours. LFT No results for input(s): "PROT", "ALBUMIN", "AST", "ALT", "ALKPHOS", "BILITOT", "BILIDIR", "IBILI" in the last 72 hours. PT/INR No results for input(s): "LABPROT", "INR" in the last 72 hours.   Impression / Plan: This is a 66 y.o.female who presents for Colonoscopy for surveillance.  The risks and benefits of endoscopic evaluation/treatment were discussed with the patient and/or family; these include but are not limited to the risk of perforation, infection, bleeding, missed lesions, lack of diagnosis, severe illness requiring hospitalization, as well as anesthesia and sedation related illnesses.  The patient's history has been reviewed, patient examined, no change in status, and deemed stable for procedure.  The patient and/or family is agreeable to proceed.    Justice Britain, MD [facility] Advanced [facility]oscopy Office # [phone]

## 2022-10-27 NOTE — Op Note (Signed)
[place] Patient Name: Jaime Harris Procedure Date: 10/27/2022 11:40 AM MRN: [id number] Endoscopist: Justice Britain , MD, [id number] Age: 67 Referring MD:  Date of Birth: Jun 26, 1956 Gender: Female Account #: [id number] Procedure:                Colonoscopy Indications:              Surveillance: Personal history of adenomatous                            polyps on last colonoscopy > 5 years ago Medicines:                Monitored Anesthesia Care Procedure:                Pre-Anesthesia Assessment:                           - Prior to the procedure, a History and Physical                            was performed, and patient medications and                            allergies were reviewed. The patient's tolerance of                            previous anesthesia was also reviewed. The risks                            and benefits of the procedure and the sedation                            options and risks were discussed with the patient.                            All questions were answered, and informed consent                            was obtained. Prior Anticoagulants: The patient has                            taken no anticoagulant or antiplatelet agents                            except for aspirin. ASA Grade Assessment: III - A                            patient with severe systemic disease. After                            reviewing the risks and benefits, the patient was                            deemed in satisfactory condition to undergo the  procedure.                           After obtaining informed consent, the colonoscope                            was passed under direct vision. Throughout the                            procedure, the patient's blood pressure, pulse, and                            oxygen saturations were monitored continuously. The                            Olympus CF-HQ190L [id number]) Colonoscope  was                            introduced through the anus and advanced to the the                            cecum, identified by appendiceal orifice and                            ileocecal valve. The colonoscopy was somewhat                            difficult due to a redundant colon. Successful                            completion of the procedure was aided by changing                            the patient's position, using manual pressure,                            straightening and shortening the scope to obtain                            bowel loop reduction and using scope torsion. The                            patient tolerated the procedure. The quality of the                            bowel preparation was adequate. The ileocecal                            valve, appendiceal orifice, and rectum were                            photographed. Scope In: [id number] Scope Out: 12:03:06 PM Scope Withdrawal Time: 0 hours 12 minutes 47 seconds  Total Procedure Duration: 0 hours 16 minutes 23 seconds  Findings:  The digital rectal exam findings include                            hemorrhoids. Pertinent negatives include no                            palpable rectal lesions.                           The colon (entire examined portion) was                            significantly redundant.                           Two sessile polyps were found in the sigmoid colon                            and hepatic flexure. The polyps were 2 to 4 mm in                            size. These polyps were removed with a cold snare.                            Resection and retrieval were complete.                           Multiple small-mouthed diverticula were found in                            the recto-sigmoid colon, sigmoid colon and                            ascending colon.                           Normal mucosa was found in the entire colon                             otherwise.                           Non-bleeding non-thrombosed internal hemorrhoids                            were found during retroflexion, during perianal                            exam and during digital exam. The hemorrhoids were                            Grade II (internal hemorrhoids that prolapse but                            reduce spontaneously). Complications:  No immediate complications. Estimated Blood Loss:     Estimated blood loss was minimal. Impression:               - Hemorrhoids found on digital rectal exam.                           - Redundant colon.                           - Two 2 to 4 mm polyps in the sigmoid colon and at                            the hepatic flexure, removed with a cold snare.                            Resected and retrieved.                           - Diverticulosis in the recto-sigmoid colon, in the                            sigmoid colon and in the ascending colon.                           - Normal mucosa in the entire examined colon                            otherwise.                           - Non-bleeding non-thrombosed internal hemorrhoids. Recommendation:           - The patient will be observed post-procedure,                            until all discharge criteria are met.                           - Discharge patient to home.                           - Patient has a contact number available for                            emergencies. The signs and symptoms of potential                            delayed complications were discussed with the                            patient. Return to normal activities tomorrow.                            Written discharge instructions were provided to the  patient.                           - High fiber diet.                           - Use FiberCon 1-2 tablets PO daily.                           - Although patient is not taking Plavix currently,                             if she needs to restart this she could in 48 hours.                            Can continue current aspirin dosing.                           - Continue present medications.                           - The findings and recommendations were discussed                            with the patient.                           - The findings and recommendations were discussed                            with the patient's family. Justice Britain, MD 10/27/2022 12:07:33 PM

## 2022-10-30 ENCOUNTER — Telehealth: Payer: Self-pay | Admitting: *Deleted

## 2022-10-30 NOTE — Telephone Encounter (Signed)
No answer on  follow up call. Left message.   

## 2022-11-06 ENCOUNTER — Encounter: Payer: Self-pay | Admitting: Gastroenterology

## 2022-12-20 NOTE — Progress Notes (Signed)
MyChart Video Visit    Virtual Visit via Video Note    Patient location: Home. Patient and provider in visit Provider location: Office  I discussed the limitations of evaluation and management by telemedicine and the availability of in person appointments. The patient expressed understanding and agreed to proceed.  Visit Date: 12/22/2022   Today's healthcare provider: Lemont FillersMelissa S O'Sullivan, NP     Subjective:    Patient ID: Jaime Harris, female    DOB: 12/12/1955, 67 y.o.   MRN: [id number]  Chief Complaint  Patient presents with   Foot Pain    Patient complains of having pain on both feet. It started 2 weeks ago with the right foot.     HPI  Sharp foot pain: She complains of intermittent sharp pain in both feet. She first noticed severe pain in the top of her right foot 2-3 weeks ago. Pain in her left medial side of her foot began about a week ago. She also endorses bilateral foot cramps and swelling in her left foot. She denies any pain in her left ankle. Right foot pain was at first daily and constant, but is now intermittent. She has woken up at night due to pain. She has not tried any OTC pain medication. She denies any associated back pain.    Past Medical History:  Diagnosis Date   Aneurysm (HCC) 2020   supraclinoid R int carotid artery   CAD (coronary artery disease)    LHC 01/03/2022: 30% of pRCA followed by 80% of mRCA s/p DES, 20% of pLAD, &30% of p-mLCX   Chronic tension headaches    Headache    Hyperlipidemia    borderline   Non-ST elevation (NSTEMI) myocardial infarction (HCC) 01/03/2022   Sickle cell trait (HCC)    Thyroid disease    enlarged thyroid   Vertigo    Vitamin D deficiency     Past Surgical History:  Procedure Laterality Date   COLONOSCOPY  2016   COLONOSCOPY WITH PROPOFOL  10/27/2022   CORONARY PRESSURE/FFR STUDY N/A 01/03/2022   Procedure: INTRAVASCULAR PRESSURE WIRE/FFR STUDY;  Surgeon: Kathleene HazelMcAlhany, Christopher D, MD;  [facility]: [facility];  Service: Cardiovascular;  Laterality: N/A;   CORONARY STENT INTERVENTION N/A 01/03/2022   Procedure: CORONARY STENT INTERVENTION;  Surgeon: Kathleene HazelMcAlhany, Christopher D, MD;  Location: [facility];  Service: Cardiovascular;  Laterality: N/A;   FOOT SURGERY Bilateral    Pt states she had bones removed from the sides of her feet and middle toes so she could walk better.   LEFT HEART CATH AND CORONARY ANGIOGRAPHY N/A 01/03/2022   Procedure: LEFT HEART CATH AND CORONARY ANGIOGRAPHY;  Surgeon: Kathleene HazelMcAlhany, Christopher D, MD;  Location: [facility];  Service: Cardiovascular;  Laterality: N/A;   TONSILLECTOMY  09/11/1973   TUBAL LIGATION      Family History  Problem Relation Age of Onset   Heart disease Mother        pacemaker/defibrillator   Diabetes Mother    Diabetes Brother    Heart disease Paternal Grandmother        died of CHF   Diabetes Paternal Grandmother    Cancer Neg Hx    Kidney disease Neg Hx    Colon cancer Neg Hx    Colon polyps Neg Hx    Rectal cancer Neg Hx    Stomach cancer Neg Hx    Esophageal cancer Neg Hx     Social History   Socioeconomic History  Marital status: Single    Spouse name: Not on file   Number of children: 3   Years of education: Not on file   Highest education level: High school graduate  Occupational History    Comment: Cashier  Tobacco Use   Smoking status: Every Day    Packs/day: .75    Types: Cigarettes    Passive exposure: Never   Smokeless tobacco: Never  Vaping Use   Vaping Use: Never used  Substance and Sexual Activity   Alcohol use: Yes    Comment: occasional   Drug use: No   Sexual activity: Not Currently    Partners: Male    Birth control/protection: Post-menopausal  Other Topics Concern   Not on file  Social History Narrative   Makes eye glasses.     Single   GED   She has 3 children   58- Son Mellody Dance- lives locally- he has one son   39- Daughter Artist Pais- 7 children (4 of these children  live with the pt- she is a primary care giver)   55- michael- lives in [place] 1 daughter   Enjoys reading   Caffeine- coffee, 4 cups daily, soda every other day   Social Determinants of Health   Financial Resource Strain: Not on file  Food Insecurity: Not on file  Transportation Needs: Not on file  Physical Activity: Not on file  Stress: Not on file  Social Connections: Not on file  Intimate Partner Violence: Not on file    Outpatient Medications Prior to Visit  Medication Sig Dispense Refill   APPLE CIDER VINEGAR PO Take 1 capsule by mouth daily.     aspirin EC 81 MG tablet Take 1 tablet (81 mg total) by mouth daily. Swallow whole. 30 tablet 11   atorvastatin (LIPITOR) 80 MG tablet Take 1 tablet (80 mg total) by mouth at bedtime. 30 tablet 5   carvedilol (COREG) 6.25 MG tablet Take 1 tablet (6.25 mg total) by mouth 2 (two) times daily with a meal. 60 tablet 2   clopidogrel (PLAVIX) 75 MG tablet Take 1 tablet (75 mg total) by mouth daily. 90 tablet 3   ezetimibe (ZETIA) 10 MG tablet Take 1 tablet (10 mg total) by mouth daily. 90 tablet 3   famotidine (PEPCID) 20 MG tablet Take 1 tablet (20 mg total) by mouth daily. 30 tablet 2   losartan (COZAAR) 25 MG tablet Take 1 tablet (25 mg total) by mouth daily. 30 tablet 2   Multiple Vitamin (MULTIVITAMIN) tablet Take 1 tablet by mouth daily.     nitroGLYCERIN (NITROSTAT) 0.4 MG SL tablet Place 1 tablet (0.4 mg total) under the tongue every 5 (five) minutes x 3 doses as needed for chest pain. 25 tablet 2   Varenicline Tartrate, Starter, (CHANTIX STARTING MONTH PAK) 0.5 MG X 11 & 1 MG X 42 TBPK Take by mouth  per package instructions 53 each 0   No facility-administered medications prior to visit.    No Known Allergies  Review of Systems  Musculoskeletal:  Positive for myalgias (top of right foot & left medial foot).       Objective:    Physical Exam Constitutional:      General: She is awake. She is not in acute  distress. Pulmonary:     Effort: Pulmonary effort is normal.  Neurological:     Mental Status: She is alert and oriented to person, place, and time.     Cranial Nerves: No facial asymmetry.  Psychiatric:        Mood and Affect: Mood normal.        Speech: Speech normal.     There were no vitals taken for this visit. Wt Readings from Last 3 Encounters:  10/27/22 177 lb (80.3 kg)  10/06/22 177 lb (80.3 kg)  08/14/22 176 lb (79.8 kg)       Assessment & Plan:   Problem List Items Addressed This Visit       Unprioritized   Bilateral foot pain - Primary    Sounds like nerve/radicular pain.  Will give trial of meloxicam once daily.  She will let me know if symptoms worsen or if symptoms don't improve in the next 2 weeks.        Meds ordered this encounter  Medications   meloxicam (MOBIC) 7.5 MG tablet    Sig: Take 1 tablet (7.5 mg total) by mouth daily.    Dispense:  14 tablet    Refill:  0    Order Specific Question:   Supervising Provider    Answer:   Danise Edge A [[id number]]    I discussed the assessment and treatment plan with the patient. The patient was provided an opportunity to ask questions and all were answered. The patient agreed with the plan and demonstrated an understanding of the instructions.   The patient was advised to call back or seek an in-person evaluation if the symptoms worsen or if the condition fails to improve as anticipated.   I,Rachel Rivera,acting as a Neurosurgeon for Lemont Fillers, NP.,have documented all relevant documentation on the behalf of Lemont Fillers, NP,as directed by  Lemont Fillers, NP while in the presence of Lemont Fillers, NP.    Lemont Fillers, NP [place]  [facility] [phone] (phone) [phone] (fax)  [facility]

## 2022-12-22 ENCOUNTER — Telehealth (INDEPENDENT_AMBULATORY_CARE_PROVIDER_SITE_OTHER): Payer: BC Managed Care – PPO | Admitting: Family

## 2022-12-22 DIAGNOSIS — M79671 Pain in right foot: Secondary | ICD-10-CM | POA: Insufficient documentation

## 2022-12-22 DIAGNOSIS — M79672 Pain in left foot: Secondary | ICD-10-CM

## 2022-12-22 MED ORDER — MELOXICAM 7.5 MG PO TABS: 7.5000 mg | ORAL_TABLET | Freq: Every day | ORAL | 0 refills | Status: DC

## 2022-12-22 NOTE — Assessment & Plan Note (Signed)
Sounds like nerve/radicular pain.  Will give trial of meloxicam once daily.  She will let me know if symptoms worsen or if symptoms don't improve in the next 2 weeks.

## 2023-01-08 ENCOUNTER — Telehealth: Payer: BC Managed Care – PPO | Admitting: Neurology

## 2023-01-08 NOTE — Progress Notes (Deleted)
   Virtual Visit via Video Note  I connected with Jaime Harris on 01/08/23 at  8:45 AM EDT by a video enabled telemedicine application and verified that I am speaking with the correct person using two identifiers.  Location: Patient: *** Provider: ***   I discussed the limitations of evaluation and management by telemedicine and the availability of in person appointments. The patient expressed understanding and agreed to proceed.  History of Present Illness: Today January 08, 2023 SS: At last visit with Dr. Marjory Lies started on Topamax 50 mg twice a day.  CTA head showed stable small aneurysm, may repeat in 2 to 3 years.  CTA head May 2023 IMPRESSION: 1. 3 mm right posterior communicating artery aneurysm, stable over the last 6 years. 2. No acute or significant interval change. 3. Normal CT of the head without contrast. 4. No other significant proximal stenosis, aneurysm, or Santa vessel occlusion within the Circle of Willis.    UPDATE (01/16/22, VRP): Since last visit, having new HA, with burning sensation on back of neck. Triggered by certain activites, sexual activity, then throbbing HA with dizziness. Some intermittent mild tension HA also. Overall only 3-4 severe HA per month. Also recent NSTEMI and in hospital April 2023, now better.    PRIOR HPI (12/18/18, VRP): 67 year old female here for evaluation of asymptomatic unruptured cerebral aneurysm and chronic tension headaches.   Patient has history of dizziness around 2014.  Also developed headaches in 2016.  She describes squeezing bitemporal headaches.  No nausea or vomiting.  No sensitivity light or sound.  Patient was diagnosed with chronic tension headaches.  She is been treated with nortriptyline with good results.   In 2017 patient had CTA of the head to rule out other causes of headache.  Patient was found to have incidental right supraclinoid cerebral aneurysm measuring 3 to 4 mm.  Follow-up MRA was recommended but patient  was lost to follow-up.   Since that time patient is doing well.  No major problems with headache.  No new neurologic symptoms.  No numbness or tingling.  No weakness.  No vision changes.   Patient is monitoring blood pressure.  Patient continues to smoke but is trying to quit.   Observations/Objective:   Assessment and Plan: 1.  Chronic migraine headache, chronic tension headaches 2.  Unruptured cerebral aneurysm  Follow Up Instructions:    I discussed the assessment and treatment plan with the patient. The patient was provided an opportunity to ask questions and all were answered. The patient agreed with the plan and demonstrated an understanding of the instructions.   The patient was advised to call back or seek an in-person evaluation if the symptoms worsen or if the condition fails to improve as anticipated.    Otila Kluver, DNP  [facility] [address], [address] [place], [place] [address] [phone]

## 2023-01-15 ENCOUNTER — Ambulatory Visit: Payer: BC Managed Care – PPO | Admitting: Neurology

## 2023-01-17 ENCOUNTER — Ambulatory Visit: Payer: BC Managed Care – PPO | Admitting: Adult Health

## 2023-01-17 ENCOUNTER — Ambulatory Visit: Payer: BC Managed Care – PPO | Admitting: Neurology

## 2023-02-07 ENCOUNTER — Telehealth: Payer: Self-pay | Admitting: Family

## 2023-02-07 NOTE — Telephone Encounter (Signed)
Patient wanted to make Melissa aware that it's time for her re-certification for her FMLA and her job will be faxing the forms to [place] soon. She stated they need to be filled out and faxed back by June 16th. Please advise.

## 2023-02-07 NOTE — Telephone Encounter (Signed)
Noted, forms not here yet

## 2023-02-13 ENCOUNTER — Ambulatory Visit (INDEPENDENT_AMBULATORY_CARE_PROVIDER_SITE_OTHER): Payer: BC Managed Care – PPO | Admitting: Family

## 2023-02-13 ENCOUNTER — Telehealth: Payer: Self-pay | Admitting: Family

## 2023-02-13 VITALS — BP 122/65 | HR 77 | Temp 97.7°F | Resp 16 | Wt 181.0 lb

## 2023-02-13 DIAGNOSIS — I10 Essential (primary) hypertension: Secondary | ICD-10-CM

## 2023-02-13 DIAGNOSIS — I2510 Atherosclerotic heart disease of native coronary artery without angina pectoris: Secondary | ICD-10-CM

## 2023-02-13 DIAGNOSIS — E785 Hyperlipidemia, unspecified: Secondary | ICD-10-CM

## 2023-02-13 DIAGNOSIS — K5900 Constipation, unspecified: Secondary | ICD-10-CM | POA: Diagnosis not present

## 2023-02-13 DIAGNOSIS — M5416 Radiculopathy, lumbar region: Secondary | ICD-10-CM

## 2023-02-13 DIAGNOSIS — Z720 Tobacco use: Secondary | ICD-10-CM

## 2023-02-13 LAB — COMPREHENSIVE METABOLIC PANEL
ALT: 13 U/L (ref 0–35)
AST: 14 U/L (ref 0–37)
Albumin: 4.2 g/dL (ref 3.5–5.2)
Alkaline Phosphatase: 123 U/L — ABNORMAL HIGH (ref 39–117)
BUN: 15 mg/dL (ref 6–23)
CO2: 30 mEq/L (ref 19–32)
Calcium: 9.5 mg/dL (ref 8.4–10.5)
Chloride: 101 mEq/L (ref 96–112)
Creatinine, Ser: 0.94 mg/dL (ref 0.40–1.20)
GFR: 63.05 mL/min (ref >60.00)
Glucose, Bld: 86 mg/dL (ref 70–99)
Potassium: 4.2 mEq/L (ref 3.5–5.1)
Sodium: 139 mEq/L (ref 135–145)
Total Bilirubin: 0.4 mg/dL (ref 0.2–1.2)
Total Protein: 7.1 g/dL (ref 6.0–8.3)

## 2023-02-13 LAB — LIPID PANEL
Cholesterol: 211 mg/dL — ABNORMAL HIGH (ref 0–200)
HDL: 47 mg/dL (ref >39.00)
LDL Cholesterol: 137 mg/dL — ABNORMAL HIGH (ref 0–99)
NonHDL: 164.49
Total CHOL/HDL Ratio: 4
Triglycerides: 139 mg/dL (ref 0.0–149.0)
VLDL: 27.8 mg/dL (ref 0.0–40.0)

## 2023-02-13 MED ORDER — EZETIMIBE 10 MG PO TABS: 10.0000 mg | ORAL_TABLET | Freq: Every day | ORAL | 1 refills | Status: DC

## 2023-02-13 MED ORDER — VARENICLINE TARTRATE (STARTER) 0.5 MG X 11 & 1 MG X 42 PO TBPK: ORAL_TABLET | ORAL | 0 refills | Status: DC

## 2023-02-13 NOTE — Telephone Encounter (Signed)
See mychart.  

## 2023-02-13 NOTE — Progress Notes (Signed)
Subjective:     Patient ID: Jaime Harris, female    DOB: 07-23-1956, 67 y.o.   MRN: [id number]  Chief Complaint  Patient presents with   Hypertension    Here for follow up   Hyperlipidemia    Here for follow up   Constipation    Patient complains of constipation     HPI  Discussed the use of AI scribe software for clinical note transcription with the patient, who gave verbal consent to proceed.  History of Present Illness   The patient, with a history of hypertension, hyperlipidemia, and a previous heart attack, presents for a routine follow-up. They report a new, brief, sharp, and burning headache in the left temple that resolved spontaneously. They have not experienced another episode since.  The patient has been managing their blood pressure without medication and reports not taking their prescribed carvedilol or losartan. They continue to take their blood thinner and cholesterol medication.  They have been experiencing worsening constipation, which they manage with Philips as needed. They report that their food seems to sit in their stomach after eating, and this discomfort appears to be worsening.  The patient also reports persistent sharp pain on the top of their foot, which occasionally worsens with certain movements. They have tried meloxicam for this pain, but it does not provide consistent relief.        Health Maintenance Due  Topic Date Due   Zoster Vaccines- Shingrix (1 of 2) Never done   COVID-19 Vaccine (2 - 2023-24 season) 05/12/2022    Past Medical History:  Diagnosis Date   Aneurysm (HCC) 2020   supraclinoid R int carotid artery   CAD (coronary artery disease)    LHC 01/03/2022: 30% of pRCA followed by 80% of mRCA s/p DES, 20% of pLAD, &30% of p-mLCX   Chronic tension headaches    Headache    Hyperlipidemia    borderline   Non-ST elevation (NSTEMI) myocardial infarction (HCC) 01/03/2022   Sickle cell trait (HCC)    Thyroid disease    enlarged  thyroid   Vertigo    Vitamin D deficiency     Past Surgical History:  Procedure Laterality Date   COLONOSCOPY  2016   COLONOSCOPY WITH PROPOFOL  10/27/2022   CORONARY PRESSURE/FFR STUDY N/A 01/03/2022   Procedure: INTRAVASCULAR PRESSURE WIRE/FFR STUDY;  Surgeon: Kathleene Hazel, MD;  [facility]: [facility];  Service: Cardiovascular;  Laterality: N/A;   CORONARY STENT INTERVENTION N/A 01/03/2022   Procedure: CORONARY STENT INTERVENTION;  Surgeon: Kathleene Hazel, MD;  [facility]: [facility];  Service: Cardiovascular;  Laterality: N/A;   FOOT SURGERY Bilateral    Pt states she had bones removed from the sides of her feet and middle toes so she could walk better.   LEFT HEART CATH AND CORONARY ANGIOGRAPHY N/A 01/03/2022   Procedure: LEFT HEART CATH AND CORONARY ANGIOGRAPHY;  Surgeon: Kathleene Hazel, MD;  [facility]: [facility];  Service: Cardiovascular;  Laterality: N/A;   TONSILLECTOMY  09/11/1973   TUBAL LIGATION      Family History  Problem Relation Age of Onset   Heart disease Mother        pacemaker/defibrillator   Diabetes Mother    Diabetes Brother    Heart disease Paternal Grandmother        died of CHF   Diabetes Paternal Grandmother    Cancer Neg Hx    Kidney disease Neg Hx    Colon cancer  Neg Hx    Colon polyps Neg Hx    Rectal cancer Neg Hx    Stomach cancer Neg Hx    Esophageal cancer Neg Hx     Social History   Socioeconomic History   Marital status: Single    Spouse name: Not on file   Number of children: 3   Years of education: Not on file   Highest education level: High school graduate  Occupational History    Comment: Cashier  Tobacco Use   Smoking status: Every Day    Packs/day: .75    Types: Cigarettes    Passive exposure: Never   Smokeless tobacco: Never  Vaping Use   Vaping Use: Never used  Substance and Sexual Activity   Alcohol use: Yes    Comment: occasional   Drug use: No   Sexual activity:  Not Currently    Partners: Male    Birth control/protection: Post-menopausal  Other Topics Concern   Not on file  Social History Narrative   Makes eye glasses.     Single   GED   She has 3 children   67- Son Mellody Dance- lives locally- he has one son   53- Daughter Artist Pais- 7 children (4 of these children live with the pt- she is a primary care giver)   61- michael- lives in [place] 1 daughter   Enjoys reading   Caffeine- coffee, 4 cups daily, soda every other day   Social Determinants of Health   Financial Resource Strain: Not on file  Food Insecurity: Not on file  Transportation Needs: Not on file  Physical Activity: Not on file  Stress: Not on file  Social Connections: Not on file  Intimate Partner Violence: Not on file    Outpatient Medications Prior to Visit  Medication Sig Dispense Refill   APPLE CIDER VINEGAR PO Take 1 capsule by mouth daily.     atorvastatin (LIPITOR) 80 MG tablet Take 1 tablet (80 mg total) by mouth at bedtime. 30 tablet 5   clopidogrel (PLAVIX) 75 MG tablet Take 1 tablet (75 mg total) by mouth daily. 90 tablet 3   famotidine (PEPCID) 20 MG tablet Take 1 tablet (20 mg total) by mouth daily. 30 tablet 2   Multiple Vitamin (MULTIVITAMIN) tablet Take 1 tablet by mouth daily.     nitroGLYCERIN (NITROSTAT) 0.4 MG SL tablet Place 1 tablet (0.4 mg total) under the tongue every 5 (five) minutes x 3 doses as needed for chest pain. 25 tablet 2   aspirin EC 81 MG tablet Take 1 tablet (81 mg total) by mouth daily. Swallow whole. 30 tablet 11   carvedilol (COREG) 6.25 MG tablet Take 1 tablet (6.25 mg total) by mouth 2 (two) times daily with a meal. 60 tablet 2   ezetimibe (ZETIA) 10 MG tablet Take 1 tablet (10 mg total) by mouth daily. 90 tablet 3   losartan (COZAAR) 25 MG tablet Take 1 tablet (25 mg total) by mouth daily. 30 tablet 2   meloxicam (MOBIC) 7.5 MG tablet Take 1 tablet (7.5 mg total) by mouth daily. 14 tablet 0   Varenicline Tartrate, Starter,  (CHANTIX STARTING MONTH PAK) 0.5 MG X 11 & 1 MG X 42 TBPK Take by mouth  per package instructions 53 each 0   No facility-administered medications prior to visit.    No Known Allergies  ROS See HPI    Objective:    Physical Exam Constitutional:      General: She is not  in acute distress.    Appearance: Normal appearance. She is well-developed.  HENT:     Head: Normocephalic and atraumatic.     Right Ear: External ear normal.     Left Ear: External ear normal.  Eyes:     General: No scleral icterus. Neck:     Thyroid: No thyromegaly.  Cardiovascular:     Rate and Rhythm: Normal rate and regular rhythm.     Heart sounds: Normal heart sounds. No murmur heard. Pulmonary:     Effort: Pulmonary effort is normal. No respiratory distress.     Breath sounds: Normal breath sounds. No wheezing.  Musculoskeletal:     Cervical back: Neck supple.  Skin:    General: Skin is warm and dry.  Neurological:     Mental Status: She is alert and oriented to person, place, and time.  Psychiatric:        Mood and Affect: Mood normal.        Behavior: Behavior normal.        Thought Content: Thought content normal.        Judgment: Judgment normal.     BP 122/65 (BP Location: Right Arm, Patient Position: Sitting, Cuff Size: Small)   Pulse 77   Temp 97.7 F (36.5 C) (Oral)   Resp 16   Wt 181 lb (82.1 kg)   SpO2 100%   BMI 34.20 kg/m  Wt Readings from Last 3 Encounters:  02/13/23 181 lb (82.1 kg)  10/27/22 177 lb (80.3 kg)  10/06/22 177 lb (80.3 kg)       Assessment & Plan:  General Health Maintenance: - Consider Shingles vaccination, to be covered by [facility]. - Recommend COVID booster and flu shot in September 2024. - Schedule regular check-up in August 2024. - Check kidney and liver function today.  Problem List Items Addressed This Visit       Unprioritized   Tobacco abuse    Struggling with quitting smoking. Previous trial of Chantix. - Restart Chantix,  starting month pack [facility]. Follow-up in 2-3 weeks to assess response and consider refills.       Lumbar radiculopathy    Chronic, intermittent sharp pain on the top of the foot. No relief with Meloxicam. Avoid further nsaids due to plavix. - Consider physical therapy if pain persists.      Relevant Medications   Varenicline Tartrate, Starter, (CHANTIX STARTING MONTH PAK) 0.5 MG X 11 & 1 MG X 42 TBPK   Hyperlipidemia    No longer taking zetia. Currently on Atorvastatin. - Check cholesterol levels today.  If elevated, will plan to restart zetia.       Relevant Orders   Lipid panel   Comp Met (CMET)   Essential hypertension   Relevant Orders   Comp Met (CMET)   Constipation - Primary    Uncontrolled.  - Add Metamucil supplement. Continue high fiber diet and adequate hydration.      CAD (coronary artery disease)    She is followed by cardiology- I have advised pt to reach out to her cardiologist to arrange follow up and also see if he would like her to restart her beta blocker at a lower dose.         I have discontinued Jailani M. Wedig's carvedilol, losartan, aspirin EC, ezetimibe, and meloxicam. I am also having her maintain her multivitamin, APPLE CIDER VINEGAR PO, atorvastatin, nitroGLYCERIN, famotidine, clopidogrel, and Varenicline Tartrate (Starter).  Meds ordered this  encounter  Medications   Varenicline Tartrate, Starter, (CHANTIX STARTING MONTH PAK) 0.5 MG X 11 & 1 MG X 42 TBPK    Sig: Take by mouth  per package instructions    Dispense:  53 each    Refill:  0    Order Specific Question:   Supervising Provider    Answer:   Danise Edge A [[id number]]

## 2023-02-13 NOTE — Assessment & Plan Note (Signed)
BP stable off medication 

## 2023-02-13 NOTE — Assessment & Plan Note (Signed)
Uncontrolled.  - Add Metamucil supplement. Continue high fiber diet and adequate hydration.

## 2023-02-13 NOTE — Assessment & Plan Note (Signed)
No longer taking zetia. Currently on Atorvastatin. - Check cholesterol levels today.  If elevated, will plan to restart zetia.

## 2023-02-13 NOTE — Assessment & Plan Note (Signed)
She is followed by cardiology- I have advised pt to reach out to her cardiologist to arrange follow up and also see if he would like her to restart her beta blocker at a lower dose.

## 2023-02-13 NOTE — Assessment & Plan Note (Signed)
Struggling with quitting smoking. Previous trial of Chantix. - Restart Chantix, starting month pack [facility]. Follow-up in 2-3 weeks to assess response and consider refills.

## 2023-02-13 NOTE — Assessment & Plan Note (Signed)
Chronic, intermittent sharp pain on the top of the foot. No relief with Meloxicam. Avoid further nsaids due to plavix. - Consider physical therapy if pain persists.

## 2023-02-21 NOTE — Telephone Encounter (Signed)
Pt called to follow up on FMLA forms. S/W Windell Moulding and she confirmed provider has forms but is out of the office this week so it will be next week before the forms will be completed and sent in. Pt said it's due by 6/17.

## 2023-02-26 NOTE — Telephone Encounter (Signed)
Pt notified that Jaime Harris is still out of office and to call and get an extension

## 2023-02-26 NOTE — Telephone Encounter (Signed)
Patient has called to remind [place] about faxing FMLA paperwork - informed that the due date has been placed in the notes .

## 2023-02-27 ENCOUNTER — Other Ambulatory Visit (HOSPITAL_BASED_OUTPATIENT_CLINIC_OR_DEPARTMENT_OTHER): Payer: Self-pay

## 2023-02-27 ENCOUNTER — Ambulatory Visit (INDEPENDENT_AMBULATORY_CARE_PROVIDER_SITE_OTHER): Payer: BC Managed Care – PPO | Admitting: Family

## 2023-02-27 VITALS — BP 127/61 | HR 86 | Temp 98.0°F | Wt 183.0 lb

## 2023-02-27 DIAGNOSIS — Z0279 Encounter for issue of other medical certificate: Secondary | ICD-10-CM

## 2023-02-27 DIAGNOSIS — S134XXA Sprain of ligaments of cervical spine, initial encounter: Secondary | ICD-10-CM | POA: Diagnosis not present

## 2023-02-27 DIAGNOSIS — G43909 Migraine, unspecified, not intractable, without status migrainosus: Secondary | ICD-10-CM | POA: Diagnosis not present

## 2023-02-27 MED ORDER — CYCLOBENZAPRINE HCL 5 MG PO TABS
5.0000 mg | ORAL_TABLET | Freq: Three times a day (TID) | ORAL | 0 refills | Status: DC | PRN
  Filled 2023-02-27: qty 30, 10d supply, fill #0
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
## 2023-02-27 NOTE — Telephone Encounter (Signed)
Form in provider's folder to be completed

## 2023-02-27 NOTE — Assessment & Plan Note (Signed)
Uncontrolled. Advised pt to schedule a follow up with her neurologist. Plan to avoid Triptans due to CAD hx.
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
## 2023-03-11 ENCOUNTER — Other Ambulatory Visit: Payer: Self-pay | Admitting: Family

## 2023-03-12 MED ORDER — VARENICLINE TARTRATE 1 MG PO TABS: 1.0000 mg | ORAL_TABLET | Freq: Two times a day (BID) | ORAL | 1 refills | Status: DC

## 2023-04-09 ENCOUNTER — Encounter (HOSPITAL_BASED_OUTPATIENT_CLINIC_OR_DEPARTMENT_OTHER): Payer: Self-pay | Admitting: Urology

## 2023-04-09 ENCOUNTER — Emergency Department (HOSPITAL_BASED_OUTPATIENT_CLINIC_OR_DEPARTMENT_OTHER): Payer: BC Managed Care – PPO

## 2023-04-09 ENCOUNTER — Emergency Department (HOSPITAL_BASED_OUTPATIENT_CLINIC_OR_DEPARTMENT_OTHER)
Admission: EM | Admit: 2023-04-09 | Discharge: 2023-04-09 | Disposition: A | Discharge status: Home / Self Care | Payer: BC Managed Care – PPO | Source: Home / Self Care | Attending: Emergency Medicine | Admitting: Emergency Medicine

## 2023-04-09 DIAGNOSIS — I700 Atherosclerosis of aorta: Secondary | ICD-10-CM | POA: Diagnosis not present

## 2023-04-09 DIAGNOSIS — Z7902 Long term (current) use of antithrombotics/antiplatelets: Secondary | ICD-10-CM | POA: Insufficient documentation

## 2023-04-09 DIAGNOSIS — M545 Low back pain, unspecified: Secondary | ICD-10-CM

## 2023-04-09 DIAGNOSIS — M5459 Other low back pain: Secondary | ICD-10-CM | POA: Diagnosis not present

## 2023-04-09 DIAGNOSIS — M5136 Other intervertebral disc degeneration, lumbar region: Secondary | ICD-10-CM | POA: Diagnosis not present

## 2023-04-09 MED ORDER — OXYCODONE HCL 5 MG PO TABS: 5.0000 mg | ORAL_TABLET | ORAL | 0 refills | Status: DC | PRN

## 2023-04-09 MED ORDER — HYDROCODONE-ACETAMINOPHEN 5-325 MG PO TABS
1.0000 | ORAL_TABLET | Freq: Once | ORAL | Status: AC
  Administered 2023-04-09: 1 via ORAL
  Filled 2023-04-09: qty 1

## 2023-04-09 MED ORDER — LIDOCAINE 5 % EX PTCH
1.0000 | MEDICATED_PATCH | CUTANEOUS | Status: DC
  Administered 2023-04-09: 1 via TRANSDERMAL
  Filled 2023-04-09: qty 1

## 2023-04-09 NOTE — ED Provider Notes (Signed)
[facility] Provider Note   CSN: [id number] Arrival date & time: 04/09/23  1033     History  Chief Complaint  Patient presents with   Back Pain    Jaime Harris is a 67 y.o. female.  67 year old female with a history of lumbar radiculopathy and NSTEMI on Plavix who presents to the emergency department with back pain.  Says that 2 days ago she started experiencing lower back pain.  Went to sleep and then woke up and her pain was significantly worse yesterday.  Now has to bend over when she walks due to the discomfort.  No radiation down her legs.  Is severe.  Has difficulty characterizing otherwise.  No weakness or numbness of her legs.  No bowel or bladder incontinence.  No changes in sensation when wiping.  No history of cancer or recent weight loss.  No injuries or trauma to the back.  No history of IV drug use or indwelling catheters.  Did try taking Tylenol and a muscle relaxer without significant improvement of her symptoms.       Home Medications Prior to Admission medications   Medication Sig Start Date End Date Taking? Authorizing Provider  oxyCODONE (ROXICODONE) 5 MG immediate release tablet Take 1 tablet (5 mg total) by mouth every 4 (four) hours as needed for severe pain. 04/09/23  Yes Rondel Baton, MD  APPLE CIDER VINEGAR PO Take 1 capsule by mouth daily.    [provider]  atorvastatin (LIPITOR) 80 MG tablet Take 1 tablet (80 mg total) by mouth at bedtime. 01/04/22   Corrin Parker, PA-C  clopidogrel (PLAVIX) 75 MG tablet Take 1 tablet (75 mg total) by mouth daily. 06/13/22   Orbie Pyo, MD  cyclobenzaprine (FLEXERIL) 5 MG tablet Take 1 tablet (5 mg total) by mouth 3 (three) times daily as needed for muscle spasms. 02/27/23   Sandford Craze, NP  ezetimibe (ZETIA) 10 MG tablet Take 1 tablet (10 mg total) by mouth daily. 02/13/23   Sandford Craze, NP  famotidine (PEPCID) 20 MG tablet Take 1  tablet (20 mg total) by mouth daily. 01/05/22   Corrin Parker, PA-C  Multiple Vitamin (MULTIVITAMIN) tablet Take 1 tablet by mouth daily.    [provider]  nitroGLYCERIN (NITROSTAT) 0.4 MG SL tablet Place 1 tablet (0.4 mg total) under the tongue every 5 (five) minutes x 3 doses as needed for chest pain. 01/04/22   Corrin Parker, PA-C  varenicline (CHANTIX CONTINUING MONTH PAK) 1 MG tablet Take 1 tablet (1 mg total) by mouth 2 (two) times daily. 03/12/23   Sandford Craze, NP      Allergies    Patient has no known allergies.    Review of Systems   Review of Systems  Physical Exam Updated Vital Signs BP (!) 138/93   Pulse 96   Temp 97.6 F (36.4 C)   Resp 18   Ht 5\' 1"  (1.549 m)   Wt 83 kg   SpO2 99%   BMI 34.57 kg/m  Physical Exam Vitals and nursing note reviewed.  Constitutional:      General: She is not in acute distress.    Appearance: She is well-developed.  HENT:     Head: Normocephalic and atraumatic.     Right Ear: External ear normal.     Left Ear: External ear normal.     Nose: Nose normal.  Eyes:     Extraocular Movements: Extraocular movements  intact.     Conjunctiva/sclera: Conjunctivae normal.     Pupils: Pupils are equal, round, and reactive to light.  Cardiovascular:     Rate and Rhythm: Normal rate and regular rhythm.     Heart sounds: No murmur heard. Pulmonary:     Effort: Pulmonary effort is normal. No respiratory distress.     Breath sounds: Normal breath sounds.  Musculoskeletal:     Cervical back: Normal range of motion and neck supple.     Right lower leg: No edema.     Left lower leg: No edema.     Comments: No spinal midline TTP in cervical or thoracic spine.  Tenderness to palpation of approximately L3 region.  No stepoffs noted.   Motor: Muscle bulk and tone are normal. Strength is 5/5 in hip flexion, knee flexion and extension, ankle dorsiflexion and plantar flexion bilaterally. Full strength of great toe dorsiflexion  bilaterally.  Sensory: Intact sensation to light touch in L2 though S1 dermatomes bilaterally.   Skin:    General: Skin is warm and dry.  Neurological:     Mental Status: She is alert and oriented to person, place, and time. Mental status is at baseline.  Psychiatric:        Mood and Affect: Mood normal.     ED Results / Procedures / Treatments   Labs (all labs ordered are listed, but only abnormal results are displayed) Labs Reviewed - No data to display  EKG None  Radiology DG Lumbar Spine Complete  Result Date: 04/09/2023 CLINICAL DATA:  Low back pain. EXAM: LUMBAR SPINE - COMPLETE 4+ VIEW COMPARISON:  04/14/2020 FINDINGS: There are 5 non rib-bearing lumbar type vertebral bodies with diminutive ribs seen bilaterally at T12. Normal alignment of the lumbar spine. No significant scoliotic curvature. No anterolisthesis or retrolisthesis. Lumbar vertebral body heights appear preserved Mild-to-moderate multilevel lumbar spine DDD, worse at L2-L3, L3-L4 and L4-L5 with disc space height loss, endplate irregularity and sclerosis Limited visualization of the bilateral SI joints is normal. Moderate colonic stool burden without evidence of enteric obstruction. Atherosclerotic plaque within the abdominal aorta. IMPRESSION: 1. Mild-to-moderate multilevel lumbar spine DDD, worse at L2-L3, L3-L4 and L4-L5, slightly progressed compared to the 04/2020 examination. 2. Aortic Atherosclerosis (ICD10-I70.0). Electronically Signed   By: Simonne Come M.D.   On: 04/09/2023 11:48    Procedures Procedures    Medications Ordered in ED Medications  lidocaine (LIDODERM) 5 % 1 patch (1 patch Transdermal Patch Applied 04/09/23 1116)  HYDROcodone-acetaminophen (NORCO/VICODIN) 5-325 MG per tablet 1 tablet (1 tablet Oral Given 04/09/23 1116)    ED Course/ Medical Decision Making/ A&P                             Medical Decision Making Amount and/or Complexity of Data Reviewed Radiology:  ordered.  Risk Prescription drug management.   Jaime Harris is a 67 y.o. female with comorbidities that complicate the patient evaluation including lumbar radiculopathy and NSTEMI on Plavix who presents emergency department with back pain  Initial Ddx:  Muscle strain, lumbar radiculopathy, spinal epidural hematoma, spinal epidural abscess, spinal cord compression/cauda equina  MDM/Course:  Patient presents to the emergency department with 2 days of low back pain.  No neurologic deficits or signs of cauda equina.  Has full strength in her lower extremities with intact sensation to light touch on exam.  Patient had x-ray obtained in the emergency department which does show significant degenerative disc  disease but no pathologic fractures.  Upon re-evaluation was feeling somewhat better after being given Norco and a lidocaine patch.  Do not have MRI capabilities at our facility at this time.  However, do not feel that she needs advanced imaging at this time given the lack of neurologic findings.  No risk factors for spinal epidural abscess and is not having fever so feel this is highly unlikely as well.  Patient was given a prescription of pain medication and instructed to follow-up with spine surgery as an outpatient.  Patient given work note for several days.  Suspect that it may be due to muscle strain.  This patient presents to the ED for concern of complaints listed in HPI, this involves an extensive number of treatment options, and is a complaint that carries with it a high risk of complications and morbidity. Disposition including potential need for admission considered.   Dispo: DC Home. Return precautions discussed including, but not limited to, those listed in the AVS. Allowed pt time to ask questions which were answered fully prior to dc.  Additional history obtained from family Records reviewed Outpatient Clinic Notes I independently reviewed the following imaging with scope of  interpretation limited to determining acute life threatening conditions related to emergency care:  Lumbar spine x-ray  and agree with the radiologist interpretation with the following exceptions: none I have reviewed the patients home medications and made adjustments as needed Social Determinants of health:  Elderly         Final Clinical Impression(s) / ED Diagnoses Final diagnoses:  Acute midline low back pain without sciatica    Rx / DC Orders ED Discharge Orders          Ordered    oxyCODONE (ROXICODONE) 5 MG immediate release tablet  Every 4 hours PRN        04/09/23 1204              Rondel Baton, MD 04/09/23 1559

## 2023-04-09 NOTE — Discharge Instructions (Signed)
You were seen for your back pain in the emergency department.   At home, please use Tylenol, lidocaine patches, and the oxycodone (for breakthrough pain) we have prescribed you as needed for pain.  Do not take the oxycodone before driving or operating heavy machinery.  Follow-up with your primary doctor in 2-3 days regarding your visit.  Follow-up with the spine clinic as soon as possible regarding your symptoms.  Return immediately to the emergency department if you experience any of the following: Numbness or weakness of your legs, bowel or bladder incontinence, numbness while wiping after pooping or urinating, or any other concerning symptoms.    Thank you for visiting our Emergency Department. It was a pleasure taking care of you today.

## 2023-04-09 NOTE — ED Triage Notes (Signed)
Pt states lower back pain that started Saturday  Denies any injury , Denies any urinary symptoms  Pain worse with movement

## 2023-04-11 ENCOUNTER — Telehealth: Payer: BC Managed Care – PPO | Admitting: Family

## 2023-04-16 ENCOUNTER — Encounter: Payer: Self-pay | Admitting: Family

## 2023-04-16 ENCOUNTER — Ambulatory Visit (INDEPENDENT_AMBULATORY_CARE_PROVIDER_SITE_OTHER): Payer: BC Managed Care – PPO | Admitting: Family

## 2023-04-16 VITALS — BP 137/82 | HR 99 | Temp 98.2°F | Resp 16 | Ht 65.0 in | Wt 184.0 lb

## 2023-04-16 DIAGNOSIS — M79671 Pain in right foot: Secondary | ICD-10-CM

## 2023-04-16 DIAGNOSIS — I720 Aneurysm of carotid artery: Secondary | ICD-10-CM

## 2023-04-16 DIAGNOSIS — Z23 Encounter for immunization: Secondary | ICD-10-CM

## 2023-04-16 DIAGNOSIS — M79672 Pain in left foot: Secondary | ICD-10-CM | POA: Diagnosis not present

## 2023-04-16 DIAGNOSIS — Z0001 Encounter for general adult medical examination with abnormal findings: Secondary | ICD-10-CM | POA: Diagnosis not present

## 2023-04-16 DIAGNOSIS — I10 Essential (primary) hypertension: Secondary | ICD-10-CM

## 2023-04-16 DIAGNOSIS — E785 Hyperlipidemia, unspecified: Secondary | ICD-10-CM

## 2023-04-16 DIAGNOSIS — Z720 Tobacco use: Secondary | ICD-10-CM | POA: Diagnosis not present

## 2023-04-16 DIAGNOSIS — Z0000 Encounter for general adult medical examination without abnormal findings: Secondary | ICD-10-CM

## 2023-04-16 MED ORDER — GABAPENTIN 300 MG PO CAPS: 300.0000 mg | ORAL_CAPSULE | Freq: Every day | ORAL | 1 refills | Status: DC

## 2023-04-16 MED ORDER — VARENICLINE TARTRATE (STARTER) 0.5 MG X 11 & 1 MG X 42 PO TBPK: ORAL_TABLET | ORAL | 0 refills | Status: DC

## 2023-04-16 NOTE — Assessment & Plan Note (Signed)
Discussed healthy diet, exercise.  Prevnar 20 today. Recommend that she get shingrix, flu and covid vaccines at her pharmacy.

## 2023-04-16 NOTE — Assessment & Plan Note (Signed)
Uncontrolled. Trial of gabapentin 300mg  PO at bedtime.

## 2023-04-16 NOTE — Progress Notes (Signed)
Subjective:     Patient ID: Jaime Harris, female    DOB: [birth year], 67 y.o.   MRN: [id number]  Chief Complaint  Patient presents with   Annual Exam    HPI  Discussed the use of AI scribe software for clinical note transcription with the patient, who gave verbal consent to proceed.  History of Present Illness           Patient presents today for complete physical.  Immunizations: prevnar 20 Diet:  Wt Readings from Last 3 Encounters:  04/16/23 184 lb (83.5 kg)  04/09/23 182 lb 15.7 oz (83 kg)  02/27/23 183 lb (83 kg)  Exercise:  not regularly  Colonoscopy:  up to date Dexa: normal 2020 Pap Smear: n/a Mammogram: 10/23 Vision: up to date Dental: N/A  She complains of bilateral foot pain at night. Makes it difficult for her to sleep.   Health Maintenance Due  Topic Date Due   Zoster Vaccines- Shingrix (1 of 2) Never done   COVID-19 Vaccine (2 - 2023-24 season) 05/12/2022   INFLUENZA VACCINE  04/12/2023    Past Medical History:  Diagnosis Date   Aneurysm (HCC) 2020   supraclinoid R int carotid artery   CAD (coronary artery disease)    LHC 01/03/2022: 30% of pRCA followed by 80% of mRCA s/p DES, 20% of pLAD, &30% of p-mLCX   Chronic tension headaches    Headache    Hyperlipidemia    borderline   Non-ST elevation (NSTEMI) myocardial infarction (HCC) 01/03/2022   Sickle cell trait (HCC)    Thyroid disease    enlarged thyroid   Vertigo    Vitamin D deficiency     Past Surgical History:  Procedure Laterality Date   COLONOSCOPY  2016   COLONOSCOPY WITH PROPOFOL  10/27/2022   CORONARY PRESSURE/FFR STUDY N/A 01/03/2022   Procedure: INTRAVASCULAR PRESSURE WIRE/FFR STUDY;  Surgeon: Kathleene Hazel, MD;  Location: [facility];  Service: Cardiovascular;  Laterality: N/A;   CORONARY STENT INTERVENTION N/A 01/03/2022   Procedure: CORONARY STENT INTERVENTION;  Surgeon: Kathleene Hazel, MD;  Location: [facility];  Service:  Cardiovascular;  Laterality: N/A;   FOOT SURGERY Bilateral    Pt states she had bones removed from the sides of her feet and middle toes so she could walk better.   LEFT HEART CATH AND CORONARY ANGIOGRAPHY N/A 01/03/2022   Procedure: LEFT HEART CATH AND CORONARY ANGIOGRAPHY;  Surgeon: Kathleene Hazel, MD;  Location: [facility];  Service: Cardiovascular;  Laterality: N/A;   TONSILLECTOMY  09/11/1973   TUBAL LIGATION      Family History  Problem Relation Age of Onset   Heart disease Mother        pacemaker/defibrillator   Diabetes Mother    Hypertension Sister    Diabetes Brother    Heart disease Paternal Grandmother        died of CHF   Diabetes Paternal Grandmother    Cancer Neg Hx    Kidney disease Neg Hx    Colon cancer Neg Hx    Colon polyps Neg Hx    Rectal cancer Neg Hx    Stomach cancer Neg Hx    Esophageal cancer Neg Hx     Social History   Socioeconomic History   Marital status: Single    Spouse name: Not on file   Number of children: 3   Years of education: Not on file   Highest education level: High  school graduate  Occupational History    Comment: Conservation officer, [employer]  Tobacco Use   Smoking status: Every Day    Current packs/day: 0.50    Types: Cigarettes    Passive exposure: Never   Smokeless tobacco: Never  Vaping Use   Vaping status: Never Used  Substance and Sexual Activity   Alcohol use: Yes    Comment: occasional   Drug use: No   Sexual activity: Not Currently    Partners: Male    Birth control/protection: Post-menopausal  Other Topics Concern   Not on file  Social History Narrative   Makes eye glasses.     Single   GED   She has 3 children   25- Son Mellody Dance- lives locally- he has one son   42- Daughter Artist Pais- 7 children (4 of these children live with the pt- she is a primary care giver)   54- michael- lives in [place] 1 daughter   Enjoys reading   Caffeine- coffee, 4 cups daily, soda every other day   Social Determinants  of Health   Financial Resource Strain: Not on file  Food Insecurity: Not on file  Transportation Needs: Not on file  Physical Activity: Not on file  Stress: Not on file  Social Connections: Not on file  Intimate Partner Violence: Not on file    Outpatient Medications Prior to Visit  Medication Sig Dispense Refill   APPLE CIDER VINEGAR PO Take 1 capsule by mouth daily.     atorvastatin (LIPITOR) 80 MG tablet Take 1 tablet (80 mg total) by mouth at bedtime. 30 tablet 5   clopidogrel (PLAVIX) 75 MG tablet Take 1 tablet (75 mg total) by mouth daily. 90 tablet 3   cyclobenzaprine (FLEXERIL) 5 MG tablet Take 1 tablet (5 mg total) by mouth 3 (three) times daily as needed for muscle spasms. 30 tablet 0   ezetimibe (ZETIA) 10 MG tablet Take 1 tablet (10 mg total) by mouth daily. 90 tablet 1   famotidine (PEPCID) 20 MG tablet Take 1 tablet (20 mg total) by mouth daily. 30 tablet 2   Multiple Vitamin (MULTIVITAMIN) tablet Take 1 tablet by mouth daily.     nitroGLYCERIN (NITROSTAT) 0.4 MG SL tablet Place 1 tablet (0.4 mg total) under the tongue every 5 (five) minutes x 3 doses as needed for chest pain. 25 tablet 2   oxyCODONE (ROXICODONE) 5 MG immediate release tablet Take 1 tablet (5 mg total) by mouth every 4 (four) hours as needed for severe pain. 15 tablet 0   varenicline (CHANTIX CONTINUING MONTH PAK) 1 MG tablet Take 1 tablet (1 mg total) by mouth 2 (two) times daily. 60 tablet 1   No facility-administered medications prior to visit.    No Known Allergies  Review of Systems  Constitutional:  Negative for weight loss.  HENT:  Negative for congestion and hearing loss.   Eyes:  Negative for blurred vision.  Respiratory:  Positive for cough (smoker's cough).   Cardiovascular:  Negative for leg swelling.  Gastrointestinal:  Negative for constipation and diarrhea.  Genitourinary:  Negative for dysuria and frequency.  Musculoskeletal:  Positive for back pain. Negative for joint pain.  Skin:   Negative for rash.  Neurological:  Positive for headaches (denies depression/anxiety).       Objective:    Physical Exam Constitutional:      General: She is not in acute distress.    Appearance: Normal appearance. She is well-developed.  HENT:     Head: Normocephalic  and atraumatic.     Right Ear: External ear normal. There is impacted cerumen.     Left Ear: Tympanic membrane, ear canal and external ear normal.  Eyes:     General: No scleral icterus. Neck:     Thyroid: No thyromegaly.  Cardiovascular:     Rate and Rhythm: Normal rate and regular rhythm.     Pulses:          Posterior tibial pulses are 2+ on the right side and 2+ on the left side.     Heart sounds: Normal heart sounds. No murmur heard. Pulmonary:     Effort: Pulmonary effort is normal. No respiratory distress.     Breath sounds: Normal breath sounds. No wheezing.  Musculoskeletal:     Cervical back: Neck supple.     Right lower leg: 1+ Edema present.     Left lower leg: 1+ Edema present.  Skin:    General: Skin is warm and dry.  Neurological:     Mental Status: She is alert and oriented to person, place, and time.  Psychiatric:        Mood and Affect: Mood normal.        Behavior: Behavior normal.        Thought Content: Thought content normal.        Judgment: Judgment normal.      BP 137/82 (BP Location: Right Arm, Patient Position: Sitting, Cuff Size: Small)   Pulse 99   Temp 98.2 F (36.8 C) (Oral)   Resp 16   Ht 5\' 5"  (1.651 m)   Wt 184 lb (83.5 kg)   SpO2 100%   BMI 30.62 kg/m  Wt Readings from Last 3 Encounters:  04/16/23 184 lb (83.5 kg)  04/09/23 182 lb 15.7 oz (83 kg)  02/27/23 183 lb (83 kg)       Assessment & Plan:   Problem List Items Addressed This Visit       Unprioritized   Tobacco abuse    Pt interested in smoking cessation. She would like a retrial of chantix.  She is advised to reach out to me in 3 weeks to let me know how she is doing on the chantix.        Relevant Orders   CT CHEST LUNG CA SCREEN LOW DOSE W/O CM   Preventative health care - Primary    Discussed healthy diet, exercise.  Prevnar 20 today. Recommend that she get shingrix, flu and covid vaccines at her pharmacy.       Hyperlipidemia    Lab Results  Component Value Date   CHOL 211 (H) 02/13/2023   HDL 47.00 02/13/2023   LDLCALC 137 (H) 02/13/2023   LDLDIRECT 132.0 04/12/2021   TRIG 139.0 02/13/2023   CHOLHDL 4 02/13/2023   Last visit LDL remained above goal despite lipitor 80 mg.  We added Zetia to her regimen.       Essential hypertension    Bp stable not currently on antihypertensive. Monitor.       Carotid aneurysm, right (HCC)    Saw Dr. Danae Orleans last year- his recommendations were as follows:  Stable small aneurysm (no change in 6 years). May repeat scan in 2-3 years. Try to stop smoking and monitor BP       Bilateral foot pain    Uncontrolled. Trial of gabapentin 300mg  PO at bedtime.      Other Visit Diagnoses     Need for pneumococcal 20-valent conjugate vaccination  Relevant Orders   Pneumococcal conjugate vaccine 20-valent (Prevnar 20) (Completed)       I have discontinued Lucine M. Lavalais's varenicline. I am also having her start on Varenicline Tartrate (Starter) and gabapentin. Additionally, I am having her maintain her multivitamin, APPLE CIDER VINEGAR PO, atorvastatin, nitroGLYCERIN, famotidine, clopidogrel, ezetimibe, cyclobenzaprine, and oxyCODONE.  Meds ordered this encounter  Medications   Varenicline Tartrate, Starter, (CHANTIX STARTING MONTH PAK) 0.5 MG X 11 & 1 MG X 42 TBPK    Sig: Take one 0.5 mg tablet by mouth once daily for 3 days, then increase to one 0.5 mg tablet twice daily for 4 days, then increase to one 1 mg tablet twice daily.    Dispense:  53 each    Refill:  0    Order Specific Question:   Supervising Provider    Answer:   Danise Edge A [[id number]]   gabapentin (NEURONTIN) 300 MG capsule    Sig: Take 1 capsule (300 mg  total) by mouth at bedtime.    Dispense:  90 capsule    Refill:  1    Order Specific Question:   Supervising Provider    Answer:   Danise Edge A [[id number]]

## 2023-04-16 NOTE — Assessment & Plan Note (Signed)
Saw Dr. Danae Orleans last year- his recommendations were as follows:  Stable small aneurysm (no change in 6 years). May repeat scan in 2-3 years. Try to stop smoking and monitor BP

## 2023-04-16 NOTE — Assessment & Plan Note (Signed)
Lab Results  Component Value Date   CHOL 211 (H) 02/13/2023   HDL 47.00 02/13/2023   LDLCALC 137 (H) 02/13/2023   LDLDIRECT 132.0 04/12/2021   TRIG 139.0 02/13/2023   CHOLHDL 4 02/13/2023   Last visit LDL remained above goal despite lipitor 80 mg.  We added Zetia to her regimen.

## 2023-04-16 NOTE — Assessment & Plan Note (Signed)
Bp stable not currently on antihypertensive. Monitor.

## 2023-04-16 NOTE — Assessment & Plan Note (Signed)
Pt interested in smoking cessation. She would like a retrial of chantix.  She is advised to reach out to me in 3 weeks to let me know how she is doing on the chantix.

## 2023-04-18 DIAGNOSIS — Z6832 Body mass index (BMI) 32.0-32.9, adult: Secondary | ICD-10-CM | POA: Diagnosis not present

## 2023-04-18 DIAGNOSIS — M47816 Spondylosis without myelopathy or radiculopathy, lumbar region: Secondary | ICD-10-CM | POA: Diagnosis not present

## 2023-04-18 DIAGNOSIS — M4316 Spondylolisthesis, lumbar region: Secondary | ICD-10-CM | POA: Diagnosis not present

## 2023-04-25 ENCOUNTER — Ambulatory Visit (HOSPITAL_BASED_OUTPATIENT_CLINIC_OR_DEPARTMENT_OTHER)
Admission: RE | Admit: 2023-04-25 | Discharge: 2023-04-25 | Disposition: A | Discharge status: Home / Self Care | Payer: BC Managed Care – PPO | Source: Ambulatory Visit | Attending: Family | Admitting: Family

## 2023-04-25 DIAGNOSIS — Z720 Tobacco use: Secondary | ICD-10-CM | POA: Insufficient documentation

## 2023-04-25 DIAGNOSIS — F17210 Nicotine dependence, cigarettes, uncomplicated: Secondary | ICD-10-CM | POA: Diagnosis not present

## 2023-04-30 ENCOUNTER — Other Ambulatory Visit: Payer: Self-pay | Admitting: Neurological Surgery

## 2023-04-30 DIAGNOSIS — M4316 Spondylolisthesis, lumbar region: Secondary | ICD-10-CM

## 2023-05-03 ENCOUNTER — Other Ambulatory Visit: Payer: Self-pay | Admitting: Family

## 2023-05-03 DIAGNOSIS — Z1231 Encounter for screening mammogram for malignant neoplasm of breast: Secondary | ICD-10-CM

## 2023-05-25 ENCOUNTER — Ambulatory Visit
Admission: RE | Admit: 2023-05-25 | Discharge: 2023-05-25 | Disposition: A | Discharge status: Home / Self Care | Payer: BC Managed Care – PPO | Source: Ambulatory Visit | Attending: Neurological Surgery | Admitting: Neurological Surgery

## 2023-05-25 DIAGNOSIS — M4316 Spondylolisthesis, lumbar region: Secondary | ICD-10-CM

## 2023-05-25 DIAGNOSIS — M47816 Spondylosis without myelopathy or radiculopathy, lumbar region: Secondary | ICD-10-CM | POA: Diagnosis not present

## 2023-06-05 ENCOUNTER — Other Ambulatory Visit: Payer: Self-pay | Admitting: Student

## 2023-06-05 ENCOUNTER — Other Ambulatory Visit (HOSPITAL_BASED_OUTPATIENT_CLINIC_OR_DEPARTMENT_OTHER): Payer: Self-pay

## 2023-06-05 ENCOUNTER — Other Ambulatory Visit (HOSPITAL_COMMUNITY): Payer: Self-pay

## 2023-06-06 ENCOUNTER — Other Ambulatory Visit (HOSPITAL_BASED_OUTPATIENT_CLINIC_OR_DEPARTMENT_OTHER): Payer: Self-pay

## 2023-06-06 NOTE — Telephone Encounter (Signed)
Pt's pharmacy is requesting a refill on atorvastatin and famotidine. These medications were prescribed in the hospital. Would Dr. Lynnette Caffey like to refill these medications? Please address

## 2023-06-07 NOTE — Telephone Encounter (Signed)
The pepcid should be over the counter and her cholesterol was last checked in primary care and they have been seeing her so they may prescribe it going forward.  Dr. Lynnette Caffey had recommended lipid clinic but the referral is closed and she has not seen them.  She is also overdue for an appointment in our office.

## 2023-06-08 ENCOUNTER — Other Ambulatory Visit (HOSPITAL_BASED_OUTPATIENT_CLINIC_OR_DEPARTMENT_OTHER): Payer: Self-pay

## 2023-06-11 ENCOUNTER — Other Ambulatory Visit: Payer: Self-pay

## 2023-06-11 MED ORDER — ATORVASTATIN CALCIUM 80 MG PO TABS: 80.0000 mg | ORAL_TABLET | Freq: Every day | ORAL | 0 refills | Status: DC

## 2023-06-11 MED ORDER — FAMOTIDINE 20 MG PO TABS: 20.0000 mg | ORAL_TABLET | Freq: Every day | ORAL | 0 refills | Status: DC

## 2023-06-11 MED ORDER — NITROGLYCERIN 0.4 MG SL SUBL: 0.4000 mg | SUBLINGUAL_TABLET | SUBLINGUAL | 0 refills | Status: DC | PRN

## 2023-06-11 NOTE — Telephone Encounter (Signed)
Pt's medication was sent to pt's pharmacy as requested. Confirmation received.  °

## 2023-06-14 DIAGNOSIS — M4316 Spondylolisthesis, lumbar region: Secondary | ICD-10-CM | POA: Diagnosis not present

## 2023-06-14 DIAGNOSIS — Z6831 Body mass index (BMI) 31.0-31.9, adult: Secondary | ICD-10-CM | POA: Diagnosis not present

## 2023-06-18 ENCOUNTER — Ambulatory Visit
Admission: RE | Admit: 2023-06-18 | Discharge: 2023-06-18 | Disposition: A | Discharge status: Home / Self Care | Payer: BC Managed Care – PPO | Source: Ambulatory Visit | Attending: Family | Admitting: Family

## 2023-06-18 DIAGNOSIS — Z1231 Encounter for screening mammogram for malignant neoplasm of breast: Secondary | ICD-10-CM | POA: Diagnosis not present

## 2023-07-10 ENCOUNTER — Other Ambulatory Visit: Payer: Self-pay | Admitting: Internal Medicine

## 2023-07-18 ENCOUNTER — Other Ambulatory Visit: Payer: Self-pay | Admitting: Internal Medicine

## 2023-07-24 ENCOUNTER — Other Ambulatory Visit: Payer: Self-pay | Admitting: Internal Medicine

## 2023-07-25 ENCOUNTER — Other Ambulatory Visit: Payer: Self-pay | Admitting: Internal Medicine

## 2023-07-27 ENCOUNTER — Other Ambulatory Visit: Payer: Self-pay

## 2023-07-27 MED ORDER — NITROGLYCERIN 0.4 MG SL SUBL: 0.4000 mg | SUBLINGUAL_TABLET | SUBLINGUAL | 0 refills | Status: AC | PRN

## 2023-08-17 ENCOUNTER — Telehealth: Payer: Self-pay | Admitting: Family

## 2023-08-17 ENCOUNTER — Ambulatory Visit (INDEPENDENT_AMBULATORY_CARE_PROVIDER_SITE_OTHER): Payer: BC Managed Care – PPO | Admitting: Family

## 2023-08-17 VITALS — BP 136/72 | HR 96 | Temp 99.2°F | Resp 16 | Ht 65.0 in | Wt 186.0 lb

## 2023-08-17 DIAGNOSIS — H8110 Benign paroxysmal vertigo, unspecified ear: Secondary | ICD-10-CM

## 2023-08-17 DIAGNOSIS — K219 Gastro-esophageal reflux disease without esophagitis: Secondary | ICD-10-CM

## 2023-08-17 DIAGNOSIS — I10 Essential (primary) hypertension: Secondary | ICD-10-CM

## 2023-08-17 DIAGNOSIS — Z720 Tobacco use: Secondary | ICD-10-CM | POA: Diagnosis not present

## 2023-08-17 DIAGNOSIS — N3281 Overactive bladder: Secondary | ICD-10-CM | POA: Diagnosis not present

## 2023-08-17 DIAGNOSIS — E785 Hyperlipidemia, unspecified: Secondary | ICD-10-CM

## 2023-08-17 DIAGNOSIS — I2510 Atherosclerotic heart disease of native coronary artery without angina pectoris: Secondary | ICD-10-CM

## 2023-08-17 DIAGNOSIS — K589 Irritable bowel syndrome without diarrhea: Secondary | ICD-10-CM | POA: Diagnosis not present

## 2023-08-17 DIAGNOSIS — H04202 Unspecified epiphora, left lacrimal gland: Secondary | ICD-10-CM | POA: Insufficient documentation

## 2023-08-17 LAB — COMPREHENSIVE METABOLIC PANEL
ALT: 15 U/L (ref 0–35)
AST: 14 U/L (ref 0–37)
Albumin: 4.3 g/dL (ref 3.5–5.2)
Alkaline Phosphatase: 138 U/L — ABNORMAL HIGH (ref 39–117)
BUN: 11 mg/dL (ref 6–23)
CO2: 31 meq/L (ref 19–32)
Calcium: 9.5 mg/dL (ref 8.4–10.5)
Chloride: 102 meq/L (ref 96–112)
Creatinine, Ser: 0.89 mg/dL (ref 0.40–1.20)
GFR: 67.09 mL/min (ref >60.00)
Glucose, Bld: 85 mg/dL (ref 70–99)
Potassium: 4.1 meq/L (ref 3.5–5.1)
Sodium: 140 meq/L (ref 135–145)
Total Bilirubin: 0.4 mg/dL (ref 0.2–1.2)
Total Protein: 7.1 g/dL (ref 6.0–8.3)

## 2023-08-17 LAB — LIPID PANEL
Cholesterol: 189 mg/dL (ref 0–200)
HDL: 47.5 mg/dL (ref >39.00)
LDL Cholesterol: 115 mg/dL — ABNORMAL HIGH (ref 0–99)
NonHDL: 141.51
Total CHOL/HDL Ratio: 4
Triglycerides: 135 mg/dL (ref 0.0–149.0)
VLDL: 27 mg/dL (ref 0.0–40.0)

## 2023-08-17 MED ORDER — VARENICLINE TARTRATE (STARTER) 0.5 MG X 11 & 1 MG X 42 PO TBPK: ORAL_TABLET | ORAL | 0 refills | Status: DC

## 2023-08-17 MED ORDER — MIRABEGRON ER 25 MG PO TB24
25.0000 mg | ORAL_TABLET | Freq: Every day | ORAL | 5 refills | Status: DC
Start: 2023-08-17 — End: 2024-03-07

## 2023-08-17 NOTE — Telephone Encounter (Signed)
Cholesterol is improving but still a little bit above goal. Please continue to work on diet/exercise.

## 2023-08-17 NOTE — Telephone Encounter (Signed)
Called patient but no answer, left voice mail for patient to call back.   

## 2023-08-17 NOTE — Assessment & Plan Note (Signed)
No sign of conjunctivitis, improving. Recommended that patient schedule follow up with her eye doctor if symptoms return.

## 2023-08-17 NOTE — Assessment & Plan Note (Addendum)
Lab Results  Component Value Date   CHOL 211 (H) 02/13/2023   HDL 47.00 02/13/2023   LDLCALC 137 (H) 02/13/2023   LDLDIRECT 132.0 04/12/2021   TRIG 139.0 02/13/2023   CHOLHDL 4 02/13/2023   Last cholesterol check in June showed levels higher than ideal. Currently on Zetia and atorvastatin. -Update cholesterol today.

## 2023-08-17 NOTE — Patient Instructions (Signed)
VISIT SUMMARY:  During today's visit, we discussed several health concerns including your left eye irritation, smoking cessation efforts, overactive bladder, cholesterol levels, and general health maintenance. We have made some adjustments to your treatment plan to better manage these issues.  YOUR PLAN:  -EYE IRRITATION: You have been experiencing tearing and tenderness in your left eye for the past two weeks, which may be due to a blocked tear duct. We recommend that you follow up with your eye doctor for further evaluation.  -SMOKING CESSATION: You have been struggling to quit smoking and previously tried Chantix, which helped reduce your cigarette use. We recommend restarting Chantix and continuing it for at least three months. Please update [place] in three weeks on your progress.  -OVERACTIVE BLADDER: You have been experiencing urinary leakage, especially when coughing. We suggest restarting Myrbetriq, which was effective for you in the past. If the cost is an issue, please let us know and consider downloading a prescription card from the [facility] website for potential savings.  -HYPERLIPIDEMIA: Your cholesterol levels were higher than ideal during your last check in June. We will update your cholesterol levels today to monitor your progress while you continue taking Zetia and atorvastatin.  -HYPERTENSION: Your blood pressure is slightly elevated. We recommend an annual follow-up with cardiology to monitor your condition.  -GENERAL HEALTH MAINTENANCE: You declined the flu shot due to your recent COVID vaccination. Please schedule a follow-up appointment in four months.  INSTRUCTIONS:  Please follow up with your eye doctor for your left eye irritation. Restart Chantix for smoking cessation and update [place] in three weeks. Restart Myrbetriq for overactive bladder and notify us if the cost is prohibitive. We will update your cholesterol levels today. Schedule an annual follow-up with cardiology for  your blood pressure. Schedule a follow-up appointment with [place] in four months.

## 2023-08-17 NOTE — Assessment & Plan Note (Signed)
  Reports of urinary leakage, particularly with coughing. Previously on Myrbetriq but stopped due to cost. -Restart Myrbetriq. Patient to notify provider if cost is prohibitive. -Recommend patient to download prescription card from [facility] website for potential cost savings.

## 2023-08-17 NOTE — Assessment & Plan Note (Signed)
Clinically stable. Recommended that pt schedule a routine follow up with cardiology.

## 2023-08-17 NOTE — Progress Notes (Signed)
Subjective:     Patient ID: Jaime Harris, female    DOB: 1955-11-05, 67 y.o.   MRN: [id number]  Chief Complaint  Patient presents with   Hypertension    Here for follow up   Eye Problem    Complains of left eye pain, teary     HPI  Discussed the use of AI scribe software for clinical note transcription with the patient, who gave verbal consent to proceed.  History of Present Illness   The patient, with a history of smoking, presents with left eye tearing that started about two weeks ago. The tearing has been associated with tenderness in the left inner canthus but reports tenderness and tearing have both improved with Tylenol sinus. There was some discharge from the eye, but no pus or eye closure. The patient has an established eye doctor.  The patient has been struggling with smoking cessation. She tried Chantix, which reduced her daily cigarette consumption from about 10-15 to 7-8. However, she did not complete the full course of Chantix.  The patient also reports bladder issues, including overactive bladder and leakage with coughing. She had previously been on Myrbetriq, which was effective, but was discontinued due to cost.  The patient's reflux is well controlled with as-needed Pepcid. Her irritable bowel symptoms are managed with probiotics and vinegar. She is currently taking Zetia and atorvastatin for elevated cholesterol. She has not had recent vertigo concerns. She has not been on any blood pressure medications and has not needed to use nitroglycerin. She is also on Plavix.          Health Maintenance Due  Topic Date Due   Zoster Vaccines- Shingrix (1 of 2) Never done   COVID-19 Vaccine (2 - 2023-24 season) 05/13/2023    Past Medical History:  Diagnosis Date   Aneurysm (HCC) 2020   supraclinoid R int carotid artery   CAD (coronary artery disease)    LHC 01/03/2022: 30% of pRCA followed by 80% of mRCA s/p DES, 20% of pLAD, &30% of p-mLCX   Chronic tension  headaches    Headache    Hyperlipidemia    borderline   Non-ST elevation (NSTEMI) myocardial infarction (HCC) 01/03/2022   Sickle cell trait (HCC)    Thyroid disease    enlarged thyroid   Vertigo    Vitamin D deficiency     Past Surgical History:  Procedure Laterality Date   COLONOSCOPY  2016   COLONOSCOPY WITH PROPOFOL  10/27/2022   CORONARY PRESSURE/FFR STUDY N/A 01/03/2022   Procedure: INTRAVASCULAR PRESSURE WIRE/FFR STUDY;  Surgeon: Kathleene Hazel, MD;  Location: [facility];  Service: Cardiovascular;  Laterality: N/A;   CORONARY STENT INTERVENTION N/A 01/03/2022   Procedure: CORONARY STENT INTERVENTION;  Surgeon: Kathleene Hazel, MD;  Location: [facility];  Service: Cardiovascular;  Laterality: N/A;   FOOT SURGERY Bilateral    Pt states she had bones removed from the sides of her feet and middle toes so she could walk better.   LEFT HEART CATH AND CORONARY ANGIOGRAPHY N/A 01/03/2022   Procedure: LEFT HEART CATH AND CORONARY ANGIOGRAPHY;  Surgeon: Kathleene Hazel, MD;  Location: [facility];  Service: Cardiovascular;  Laterality: N/A;   TONSILLECTOMY  09/11/1973   TUBAL LIGATION      Family History  Problem Relation Age of Onset   Heart disease Mother        pacemaker/defibrillator   Diabetes Mother    Hypertension Sister  Diabetes Brother    Heart disease Paternal Grandmother        died of CHF   Diabetes Paternal Grandmother    Cancer Neg Hx    Kidney disease Neg Hx    Colon cancer Neg Hx    Colon polyps Neg Hx    Rectal cancer Neg Hx    Stomach cancer Neg Hx    Esophageal cancer Neg Hx     Social History   Socioeconomic History   Marital status: Single    Spouse name: Not on file   Number of children: 3   Years of education: Not on file   Highest education level: GED or equivalent  Occupational History    Comment: Conservation officer, [employer]  Tobacco Use   Smoking status: Every Day    Current packs/day: 0.50    Types:  Cigarettes    Passive exposure: Never   Smokeless tobacco: Never  Vaping Use   Vaping status: Never Used  Substance and Sexual Activity   Alcohol use: Yes    Comment: occasional   Drug use: No   Sexual activity: Not Currently    Partners: Male    Birth control/protection: Post-menopausal  Other Topics Concern   Not on file  Social History Narrative   Makes eye glasses.     Single   GED   She has 3 children   55- Son Mellody Dance- lives locally- he has one son   71- Daughter Artist Pais- 7 children (4 of these children live with the pt- she is a primary care giver)   1985- michael- lives in [place] 1 daughter   Enjoys reading   Caffeine- coffee, 4 cups daily, soda every other day   Social Determinants of Health   Financial Resource Strain: Low Risk  (08/17/2023)   Overall Financial Resource Strain (CARDIA)    Difficulty of Paying Living Expenses: Not very hard  Food Insecurity: No Food Insecurity (08/17/2023)   Hunger Vital Sign    Worried About Running Out of Food in the Last Year: Never true    Ran Out of Food in the Last Year: Never true  Transportation Needs: No Transportation Needs (08/17/2023)   PRAPARE - Administrator, Civil Service (Medical): No    Lack of Transportation (Non-Medical): No  Physical Activity: Insufficiently Active (08/17/2023)   Exercise Vital Sign    Days of Exercise per Week: 2 days    Minutes of Exercise per Session: 30 min  Stress: No Stress Concern Present (08/17/2023)   [employer] of Occupational Health - Occupational Stress Questionnaire    Feeling of Stress : Not at all  Social Connections: Moderately Isolated (08/17/2023)   Social Connection and Isolation Panel [NHANES]    Frequency of Communication with Friends and Family: More than three times a week    Frequency of Social Gatherings with Friends and Family: Once a week    Attends Religious Services: More than 4 times per year    Active Member of [employer] or Organizations: No     Attends Engineer, structural: Not on file    Marital Status: Divorced  Intimate Partner Violence: Not on file    Outpatient Medications Prior to Visit  Medication Sig Dispense Refill   APPLE CIDER VINEGAR PO Take 1 capsule by mouth daily.     atorvastatin (LIPITOR) 80 MG tablet Take 1 tablet (80 mg total) by mouth at bedtime. 30 tablet 0   clopidogrel (PLAVIX) 75 MG tablet TAKE 1 TABLET  BY MOUTH EVERY DAY 90 tablet 0   cyclobenzaprine (FLEXERIL) 5 MG tablet Take 1 tablet (5 mg total) by mouth 3 (three) times daily as needed for muscle spasms. 30 tablet 0   ezetimibe (ZETIA) 10 MG tablet Take 1 tablet (10 mg total) by mouth daily. 90 tablet 1   famotidine (PEPCID) 20 MG tablet TAKE 1 TABLET BY MOUTH EVERY DAY 15 tablet 0   gabapentin (NEURONTIN) 300 MG capsule Take 1 capsule (300 mg total) by mouth at bedtime. 90 capsule 1   Multiple Vitamin (MULTIVITAMIN) tablet Take 1 tablet by mouth daily.     nitroGLYCERIN (NITROSTAT) 0.4 MG SL tablet Place 1 tablet (0.4 mg total) under the tongue every 5 (five) minutes as needed for chest pain. 12 tablet 0   oxyCODONE (ROXICODONE) 5 MG immediate release tablet Take 1 tablet (5 mg total) by mouth every 4 (four) hours as needed for severe pain. 15 tablet 0   Varenicline Tartrate, Starter, (CHANTIX STARTING MONTH PAK) 0.5 MG X 11 & 1 MG X 42 TBPK Take one 0.5 mg tablet by mouth once daily for 3 days, then increase to one 0.5 mg tablet twice daily for 4 days, then increase to one 1 mg tablet twice daily. 53 each 0   No facility-administered medications prior to visit.    No Known Allergies  ROS See HPI    Objective:    Physical Exam Constitutional:      General: She is not in acute distress.    Appearance: Normal appearance. She is well-developed.  HENT:     Head: Normocephalic and atraumatic.     Right Ear: External ear normal.     Left Ear: External ear normal.  Eyes:     General: Lids are normal. No scleral icterus.    Comments: No  tearing noted, no crusting noted either eye  Neck:     Thyroid: No thyromegaly.  Cardiovascular:     Rate and Rhythm: Normal rate and regular rhythm.     Heart sounds: Normal heart sounds. No murmur heard. Pulmonary:     Effort: Pulmonary effort is normal. No respiratory distress.     Breath sounds: Normal breath sounds. No wheezing.  Musculoskeletal:     Cervical back: Neck supple.  Skin:    General: Skin is warm and dry.  Neurological:     Mental Status: She is alert and oriented to person, place, and time.  Psychiatric:        Mood and Affect: Mood normal.        Behavior: Behavior normal.        Thought Content: Thought content normal.        Judgment: Judgment normal.      BP 136/72   Pulse 96   Temp 99.2 F (37.3 C) (Oral)   Resp 16   Ht 5\' 5"  (1.651 m)   Wt 186 lb (84.4 kg)   SpO2 99%   BMI 30.95 kg/m  Wt Readings from Last 3 Encounters:  08/17/23 186 lb (84.4 kg)  04/16/23 184 lb (83.5 kg)  04/09/23 182 lb 15.7 oz (83 kg)       Assessment & Plan:   Problem List Items Addressed This Visit       High   Tobacco abuse - Primary    She tried chantix,  did cut back but only took for 4 weeks.  Struggling with quitting smoking. Previously tried Chantix with some reduction in cigarette use but did not  complete the full course. -Restart Chantix, with a plan to continue for at least three months. -Patient to update provider in three weeks.          Unprioritized   Overactive bladder     Reports of urinary leakage, particularly with coughing. Previously on Myrbetriq but stopped due to cost. -Restart Myrbetriq. Patient to notify provider if cost is prohibitive. -Recommend patient to download prescription card from [facility] website for potential cost savings.       Relevant Medications   mirabegron ER (MYRBETRIQ) 25 MG TB24 tablet   IBS (irritable bowel syndrome)    Reports symptoms stable with vinegar and probiotics.      Hyperlipidemia    Lab  Results  Component Value Date   CHOL 211 (H) 02/13/2023   HDL 47.00 02/13/2023   LDLCALC 137 (H) 02/13/2023   LDLDIRECT 132.0 04/12/2021   TRIG 139.0 02/13/2023   CHOLHDL 4 02/13/2023   Last cholesterol check in June showed levels higher than ideal. Currently on Zetia and atorvastatin. -Update cholesterol today.       Relevant Orders   Lipid panel   Comp Met (CMET)   Gastroesophageal reflux disease    Stable with prn use of pepcid.        Excessive tear production of left lacrimal gland    No sign of conjunctivitis, improving. Recommended that patient schedule follow up with her eye doctor if symptoms return.      Essential hypertension    BP Readings from Last 3 Encounters:  08/17/23 136/72  04/16/23 137/82  04/09/23 (!) 138/93   Not on antihypertensive. BP acceptable. Monitor.       CAD (coronary artery disease)    Clinically stable. Recommended that pt schedule a routine follow up with cardiology.      RESOLVED: BPV (benign positional vertigo)    No recent symptoms.        I have discontinued Fayetta M. Bieker's oxyCODONE. I am also having her start on mirabegron ER. Additionally, I am having her maintain her multivitamin, APPLE CIDER VINEGAR PO, ezetimibe, cyclobenzaprine, gabapentin, atorvastatin, famotidine, clopidogrel, nitroGLYCERIN, and Varenicline Tartrate (Starter).  Meds ordered this encounter  Medications   Varenicline Tartrate, Starter, (CHANTIX STARTING MONTH PAK) 0.5 MG X 11 & 1 MG X 42 TBPK    Sig: Take one 0.5 mg tablet by mouth once daily for 3 days, then increase to one 0.5 mg tablet twice daily for 4 days, then increase to one 1 mg tablet twice daily.    Dispense:  53 each    Refill:  0    Order Specific Question:   Supervising Provider    Answer:   Danise Edge A [[id number]]   mirabegron ER (MYRBETRIQ) 25 MG TB24 tablet    Sig: Take 1 tablet (25 mg total) by mouth daily.    Dispense:  30 tablet    Refill:  5    Order Specific Question:    Supervising Provider    Answer:   Danise Edge A [[id number]]

## 2023-08-17 NOTE — Assessment & Plan Note (Signed)
Reports symptoms stable with vinegar and probiotics.

## 2023-08-17 NOTE — Assessment & Plan Note (Signed)
Stable with prn use of pepcid.

## 2023-08-17 NOTE — Assessment & Plan Note (Addendum)
She tried chantix,  did cut back but only took for 4 weeks.  Struggling with quitting smoking. Previously tried Chantix with some reduction in cigarette use but did not complete the full course. -Restart Chantix, with a plan to continue for at least three months. -Patient to update provider in three weeks.

## 2023-08-17 NOTE — Assessment & Plan Note (Addendum)
BP Readings from Last 3 Encounters:  08/17/23 136/72  04/16/23 137/82  04/09/23 (!) 138/93   Not on antihypertensive. BP acceptable. Monitor.

## 2023-08-17 NOTE — Assessment & Plan Note (Signed)
No recent symptoms

## 2023-08-20 NOTE — Telephone Encounter (Signed)
Patient returning CMA's call. Please call back to discuss results

## 2023-08-22 NOTE — Telephone Encounter (Signed)
Called again but no answer. Left detail message with this information

## 2023-09-19 ENCOUNTER — Ambulatory Visit (INDEPENDENT_AMBULATORY_CARE_PROVIDER_SITE_OTHER): Payer: BC Managed Care – PPO | Admitting: Family

## 2023-09-19 VITALS — BP 132/72 | HR 92 | Temp 98.7°F | Resp 16 | Ht 65.0 in | Wt 184.0 lb

## 2023-09-19 DIAGNOSIS — M7989 Other specified soft tissue disorders: Secondary | ICD-10-CM | POA: Insufficient documentation

## 2023-09-19 DIAGNOSIS — Z720 Tobacco use: Secondary | ICD-10-CM | POA: Diagnosis not present

## 2023-09-19 DIAGNOSIS — M5416 Radiculopathy, lumbar region: Secondary | ICD-10-CM | POA: Diagnosis not present

## 2023-09-19 DIAGNOSIS — M25512 Pain in left shoulder: Secondary | ICD-10-CM | POA: Diagnosis not present

## 2023-09-19 MED ORDER — METHYLPREDNISOLONE 4 MG PO TBPK
ORAL_TABLET | ORAL | 0 refills | Status: DC
Start: 2023-09-19 — End: 2023-11-28

## 2023-09-19 MED ORDER — CYCLOBENZAPRINE HCL 5 MG PO TABS: 5.0000 mg | ORAL_TABLET | Freq: Three times a day (TID) | ORAL | 0 refills | Status: DC | PRN

## 2023-09-19 NOTE — Assessment & Plan Note (Signed)
 New. Will see how she does with the medrol dose pak.   If no improvement, consider referral to sports medicine.

## 2023-09-19 NOTE — Progress Notes (Signed)
 Subjective:     Patient ID: Jaime Harris, female    DOB: May 19, 1956, 68 y.o.   MRN: [id number]  Chief Complaint  Patient presents with   Leg Pain    Patient complains of left leg pain since monday   Shoulder Pain    Patient complains of left shoulder pain when rotating arm    Leg Pain   Shoulder Pain     Discussed the use of AI scribe software for clinical note transcription with the patient, who gave verbal consent to proceed.  History of Present Illness   The patient, with a history of chronic left leg pain, presents with worsening pain that has been unresponsive to gabapentin . The pain, which originates in the buttocks and radiates down the left leg, is most severe when lying down. The patient also reports a new onset of left shoulder pain, which limits her range of motion. She also has a palpable mass on her right shoulder, which is not painful. The patient is a current smoker, currently trying to quit with the aid of Chantix . she has cut down from about 12 cigarettes/day to 7-8/day.          Health Maintenance Due  Topic Date Due   Zoster Vaccines- Shingrix (1 of 2) Never done   COVID-19 Vaccine (2 - 2024-25 season) 05/13/2023    Past Medical History:  Diagnosis Date   Aneurysm (HCC) 2020   supraclinoid R int carotid artery   CAD (coronary artery disease)    LHC 01/03/2022: 30% of pRCA followed by 80% of mRCA s/p DES, 20% of pLAD, &30% of p-mLCX   Chronic tension headaches    Headache    Hyperlipidemia    borderline   Non-ST elevation (NSTEMI) myocardial infarction (HCC) 01/03/2022   Sickle cell trait (HCC)    Thyroid  disease    enlarged thyroid    Vertigo    Vitamin D  deficiency     Past Surgical History:  Procedure Laterality Date   COLONOSCOPY  2016   COLONOSCOPY WITH PROPOFOL   10/27/2022   CORONARY PRESSURE/FFR STUDY N/A 01/03/2022   Procedure: INTRAVASCULAR PRESSURE WIRE/FFR STUDY;  Surgeon: Verlin Lonni BIRCH, MD;  Location: [facility];  Service: Cardiovascular;  Laterality: N/A;   CORONARY STENT INTERVENTION N/A 01/03/2022   Procedure: CORONARY STENT INTERVENTION;  Surgeon: Verlin Lonni BIRCH, MD;  Location: [facility];  Service: Cardiovascular;  Laterality: N/A;   FOOT SURGERY Bilateral    Pt states she had bones removed from the sides of her feet and middle toes so she could walk better.   LEFT HEART CATH AND CORONARY ANGIOGRAPHY N/A 01/03/2022   Procedure: LEFT HEART CATH AND CORONARY ANGIOGRAPHY;  Surgeon: Verlin Lonni BIRCH, MD;  Location: [facility];  Service: Cardiovascular;  Laterality: N/A;   TONSILLECTOMY  09/11/1973   TUBAL LIGATION      Family History  Problem Relation Age of Onset   Heart disease Mother        pacemaker/defibrillator   Diabetes Mother    Hypertension Sister    Diabetes Brother    Heart disease Paternal Grandmother        died of CHF   Diabetes Paternal Grandmother    Cancer Neg Hx    Kidney disease Neg Hx    Colon cancer Neg Hx    Colon polyps Neg Hx    Rectal cancer Neg Hx    Stomach cancer Neg Hx    Esophageal cancer Neg  Hx     Social History   Socioeconomic History   Marital status: Single    Spouse name: Not on file   Number of children: 3   Years of education: Not on file   Highest education level: GED or equivalent  Occupational History    Comment: Conservation Officer, [employer]  Tobacco Use   Smoking status: Every Day    Current packs/day: 0.50    Types: Cigarettes    Passive exposure: Never   Smokeless tobacco: Never  Vaping Use   Vaping status: Never Used  Substance and Sexual Activity   Alcohol use: Yes    Comment: occasional   Drug use: No   Sexual activity: Not Currently    Partners: Male    Birth control/protection: Post-menopausal  Other Topics Concern   Not on file  Social History Narrative   Makes eye glasses.     Single   GED   She has 3 children   22- Son Francis- lives locally- he has one son   55- Daughter Freddy- 7 children  (4 of these children live with the pt- she is a primary care giver)   1985- michael- lives in [place] 1 daughter   Enjoys reading   Caffeine- coffee, 4 cups daily, soda every other day   Social Drivers of Health   Financial Resource Strain: Low Risk  (08/17/2023)   Overall Financial Resource Strain (CARDIA)    Difficulty of Paying Living Expenses: Not very hard  Food Insecurity: No Food Insecurity (08/17/2023)   Hunger Vital Sign    Worried About Running Out of Food in the Last Year: Never true    Ran Out of Food in the Last Year: Never true  Transportation Needs: No Transportation Needs (08/17/2023)   PRAPARE - Administrator, Civil Service (Medical): No    Lack of Transportation (Non-Medical): No  Physical Activity: Insufficiently Active (08/17/2023)   Exercise Vital Sign    Days of Exercise per Week: 2 days    Minutes of Exercise per Session: 30 min  Stress: No Stress Concern Present (08/17/2023)   [employer] of Occupational Health - Occupational Stress Questionnaire    Feeling of Stress : Not at all  Social Connections: Moderately Isolated (08/17/2023)   Social Connection and Isolation Panel [NHANES]    Frequency of Communication with Friends and Family: More than three times a week    Frequency of Social Gatherings with Friends and Family: Once a week    Attends Religious Services: More than 4 times per year    Active Member of [employer] or Organizations: No    Attends Engineer, Structural: Not on file    Marital Status: Divorced  Intimate Partner Violence: Not on file    Outpatient Medications Prior to Visit  Medication Sig Dispense Refill   APPLE CIDER VINEGAR PO Take 1 capsule by mouth daily.     atorvastatin  (LIPITOR ) 80 MG tablet Take 1 tablet (80 mg total) by mouth at bedtime. 30 tablet 0   clopidogrel  (PLAVIX ) 75 MG tablet TAKE 1 TABLET BY MOUTH EVERY DAY 90 tablet 0   ezetimibe  (ZETIA ) 10 MG tablet Take 1 tablet (10 mg total) by mouth daily.  90 tablet 1   famotidine  (PEPCID ) 20 MG tablet TAKE 1 TABLET BY MOUTH EVERY DAY 15 tablet 0   gabapentin  (NEURONTIN ) 300 MG capsule Take 1 capsule (300 mg total) by mouth at bedtime. 90 capsule 1   mirabegron  ER (MYRBETRIQ ) 25 MG TB24  tablet Take 1 tablet (25 mg total) by mouth daily. 30 tablet 5   Multiple Vitamin (MULTIVITAMIN) tablet Take 1 tablet by mouth daily.     nitroGLYCERIN  (NITROSTAT ) 0.4 MG SL tablet Place 1 tablet (0.4 mg total) under the tongue every 5 (five) minutes as needed for chest pain. 12 tablet 0   Varenicline  Tartrate, Starter, (CHANTIX  STARTING MONTH PAK) 0.5 MG X 11 & 1 MG X 42 TBPK Take one 0.5 mg tablet by mouth once daily for 3 days, then increase to one 0.5 mg tablet twice daily for 4 days, then increase to one 1 mg tablet twice daily. 53 each 0   cyclobenzaprine  (FLEXERIL ) 5 MG tablet Take 1 tablet (5 mg total) by mouth 3 (three) times daily as needed for muscle spasms. 30 tablet 0   No facility-administered medications prior to visit.    No Known Allergies  ROS     Objective:    Physical Exam Constitutional:      General: She is not in acute distress.    Appearance: Normal appearance. She is well-developed.  HENT:     Head: Normocephalic and atraumatic.     Right Ear: External ear normal.     Left Ear: External ear normal.  Eyes:     General: No scleral icterus. Neck:     Thyroid : No thyromegaly.  Cardiovascular:     Rate and Rhythm: Normal rate and regular rhythm.     Heart sounds: Normal heart sounds. No murmur heard. Pulmonary:     Effort: Pulmonary effort is normal. No respiratory distress.     Breath sounds: Normal breath sounds. No wheezing.  Musculoskeletal:     Cervical back: Normal and neck supple. No swelling or tenderness.     Thoracic back: Normal. No swelling or tenderness.     Lumbar back: Normal. No swelling or tenderness.     Comments: Bilateral LE strength is 5/5  Firm approximately 1 inch wide semi-mobile mass noted right  upper shoulder near base of right neck.    Skin:    General: Skin is warm and dry.  Neurological:     Mental Status: She is alert and oriented to person, place, and time.     Deep Tendon Reflexes:     Reflex Scores:      Patellar reflexes are 1+ on the right side and 1+ on the left side. Psychiatric:        Mood and Affect: Mood normal.        Behavior: Behavior normal.        Thought Content: Thought content normal.        Judgment: Judgment normal.      BP 132/72 (BP Location: Right Arm, Patient Position: Sitting, Cuff Size: Small)   Pulse 92   Temp 98.7 F (37.1 C) (Oral)   Resp 16   Ht 5' 5 (1.651 m)   Wt 184 lb (83.5 kg)   SpO2 98%   BMI 30.62 kg/m  Wt Readings from Last 3 Encounters:  09/19/23 184 lb (83.5 kg)  08/17/23 186 lb (84.4 kg)  04/16/23 184 lb (83.5 kg)       Assessment & Plan:   Problem List Items Addressed This Visit       High   Tobacco abuse   Tolerating chantix .  Recommend that she continue to cut down her cigarettes/day.         Unprioritized   Soft tissue mass   New.  Will obtain US   for further evaluation. ? [facility].       Relevant Orders   US  SOFT TISSUE NECK   Lumbar radiculopathy - Primary   She has had intermittent issues dating back to 2022.  Recurrent episodes of pain radiating from buttock down the left leg, worse when lying down. Gabapentin  initially helpful but now less effective. Unable to prescribe NSAIDS due to plavix  use. -Start Medrol  Dosepak (steroid) to reduce inflammation.  -refill flexeril  -Refer to physical therapy -If no improvement after physical therapy, consider MRI of the spine.        Relevant Medications   methylPREDNISolone  (MEDROL  DOSEPAK) 4 MG TBPK tablet   cyclobenzaprine  (FLEXERIL ) 5 MG tablet   Other Relevant Orders   Ambulatory referral to Physical Therapy   Left shoulder pain   New. Will see how she does with the medrol  dose pak.   If no improvement, consider referral to sports medicine.         I am having Brandilyn M. Pulver start on methylPREDNISolone . I am also having her maintain her multivitamin, APPLE CIDER VINEGAR PO, ezetimibe , gabapentin , atorvastatin , famotidine , clopidogrel , nitroGLYCERIN , Varenicline  Tartrate (Starter), mirabegron  ER, and cyclobenzaprine .  Meds ordered this encounter  Medications   methylPREDNISolone  (MEDROL  DOSEPAK) 4 MG TBPK tablet    Sig: Take per package instructions.    Dispense:  21 tablet    Refill:  0    Supervising Provider:   DOMENICA BLACKBIRD A [[id number]]   cyclobenzaprine  (FLEXERIL ) 5 MG tablet    Sig: Take 1 tablet (5 mg total) by mouth 3 (three) times daily as needed for muscle spasms.    Dispense:  30 tablet    Refill:  0    Supervising Provider:   DOMENICA BLACKBIRD A [[id number]]

## 2023-09-19 NOTE — Assessment & Plan Note (Signed)
 She has had intermittent issues dating back to 2022.  Recurrent episodes of pain radiating from buttock down the left leg, worse when lying down. Gabapentin  initially helpful but now less effective. Unable to prescribe NSAIDS due to plavix  use. -Start Medrol  Dosepak (steroid) to reduce inflammation.  -refill flexeril  -Refer to physical therapy -If no improvement after physical therapy, consider MRI of the spine.

## 2023-09-19 NOTE — Assessment & Plan Note (Signed)
 New.  Will obtain US for further evaluation. ? [facility]oma.

## 2023-09-19 NOTE — Patient Instructions (Signed)
 VISIT SUMMARY:  During today's visit, we discussed your chronic left leg pain, new left shoulder pain, and muscle spasms. We also reviewed your progress with quitting smoking.  YOUR PLAN:  -LEFT-SIDED SCIATICA: Sciatica is pain that radiates along the path of the sciatic nerve, which runs from your lower back through your hips and buttocks and down each leg. We will start you on a Medrol  Dosepak, a steroid to reduce inflammation, and refer you to physical therapy for 4-6 weeks. If there is no improvement after physical therapy, we may consider an MRI of your spine.  -LEFT SHOULDER PAIN: Your shoulder pain, which limits your range of motion, may be due to a rotator cuff issue. We will continue the Medrol  Dosepak, which may also help with your shoulder pain, and order an ultrasound to evaluate the palpable mass on your shoulder.  -MUSCLE SPASMS: Muscle spasms are sudden, involuntary muscle contractions. We will prescribe Cyclobenzaprine  (Flexeril ) to be taken at bedtime to help relieve your muscle spasms, as it may cause drowsiness.  -TOBACCO USE: You are currently smoking 8 cigarettes per day, down from 10-12 per day before starting Chantix . Continue taking Chantix  and try to gradually reduce your cigarette use further.  INSTRUCTIONS:  Please follow up in 3-4 months or sooner if your pain does not improve with the current plan.

## 2023-09-19 NOTE — Assessment & Plan Note (Signed)
 Tolerating chantix.  Recommend that she continue to cut down her cigarettes/day.

## 2023-09-21 ENCOUNTER — Ambulatory Visit (HOSPITAL_BASED_OUTPATIENT_CLINIC_OR_DEPARTMENT_OTHER): Payer: BC Managed Care – PPO

## 2023-09-28 ENCOUNTER — Ambulatory Visit (HOSPITAL_BASED_OUTPATIENT_CLINIC_OR_DEPARTMENT_OTHER)
Admission: RE | Admit: 2023-09-28 | Discharge: 2023-09-28 | Disposition: A | Discharge status: Home / Self Care | Payer: BC Managed Care – PPO | Source: Ambulatory Visit | Attending: Family | Admitting: Family

## 2023-09-28 DIAGNOSIS — M7989 Other specified soft tissue disorders: Secondary | ICD-10-CM | POA: Insufficient documentation

## 2023-09-28 DIAGNOSIS — E041 Nontoxic single thyroid nodule: Secondary | ICD-10-CM | POA: Diagnosis not present

## 2023-10-01 ENCOUNTER — Encounter: Payer: Self-pay | Admitting: Family

## 2023-10-16 ENCOUNTER — Other Ambulatory Visit: Payer: Self-pay | Admitting: Internal Medicine

## 2023-11-07 ENCOUNTER — Ambulatory Visit: Payer: BC Managed Care – PPO | Admitting: Physical Therapy

## 2023-11-08 NOTE — Therapy (Signed)
 OUTPATIENT PHYSICAL THERAPY THORACOLUMBAR EVALUATION   Patient Name: Jaime Harris MRN: [id number] DOB:Dec 17, 1955, 68 y.o., female Today's Date: 11/09/2023  END OF SESSION:  PT End of Session - 11/09/23 0942     Visit Number 1    Date for PT Re-Evaluation 12/21/23    Authorization Type BCBS    PT Start Time 0940    PT Stop Time 1020    PT Time Calculation (min) 40 min    Activity Tolerance Patient tolerated treatment well    Behavior During Therapy [facility] for tasks assessed/performed             Past Medical History:  Diagnosis Date   Aneurysm (HCC) 2020   supraclinoid R int carotid artery   CAD (coronary artery disease)    LHC 01/03/2022: 30% of pRCA followed by 80% of mRCA s/p DES, 20% of pLAD, &30% of p-mLCX   Chronic tension headaches    Headache    Hyperlipidemia    borderline   Non-ST elevation (NSTEMI) myocardial infarction (HCC) 01/03/2022   Sickle cell trait (HCC)    Thyroid disease    enlarged thyroid   Vertigo    Vitamin D deficiency    Past Surgical History:  Procedure Laterality Date   COLONOSCOPY  2016   COLONOSCOPY WITH PROPOFOL  10/27/2022   CORONARY PRESSURE/FFR STUDY N/A 01/03/2022   Procedure: INTRAVASCULAR PRESSURE WIRE/FFR STUDY;  Surgeon: Kathleene Hazel, MD;  Location: [facility];  Service: Cardiovascular;  Laterality: N/A;   CORONARY STENT INTERVENTION N/A 01/03/2022   Procedure: CORONARY STENT INTERVENTION;  Surgeon: Kathleene Hazel, MD;  Location: [facility];  Service: Cardiovascular;  Laterality: N/A;   FOOT SURGERY Bilateral    Pt states she had bones removed from the sides of her feet and middle toes so she could walk better.   LEFT HEART CATH AND CORONARY ANGIOGRAPHY N/A 01/03/2022   Procedure: LEFT HEART CATH AND CORONARY ANGIOGRAPHY;  Surgeon: Kathleene Hazel, MD;  Location: [facility];  Service: Cardiovascular;  Laterality: N/A;   TONSILLECTOMY  09/11/1973   TUBAL LIGATION      Patient Active Problem List   Diagnosis Date Noted   Left shoulder pain 09/19/2023   Soft tissue mass 09/19/2023   Excessive tear production of left lacrimal gland 08/17/2023   Whiplash injuries, initial encounter 02/27/2023   Constipation 02/13/2023   Bilateral foot pain 12/22/2022   Preventative health care 04/14/2022   Ceruminosis, bilateral 04/14/2022   CAD (coronary artery disease) 01/04/2022   Carotid aneurysm, right (HCC) 01/04/2022   Essential hypertension    Hyperlipidemia    History of non-ST elevation myocardial infarction (NSTEMI) 01/03/2022   Overactive bladder 04/12/2021   Lumbar radiculopathy 01/04/2021   Gastroesophageal reflux disease 10/13/2020   Other intervertebral disc degeneration, lumbar region 04/14/2020   Obstructive sleep apnea 11/04/2018   Vitamin D deficiency 12/30/2014   Migraine without status migrainosus, not intractable 12/25/2014   Tobacco abuse 12/25/2014   Carpal tunnel syndrome 12/25/2014   Thyromegaly 11/16/2014   IBS (irritable bowel syndrome) 11/16/2014    PCP: Sandford Craze, NP   REFERRING PROVIDER: Sandford Craze, NP   REFERRING DIAG: M54.16 (ICD-10-CM) - Lumbar radiculopathy   Rationale for Evaluation and Treatment: Rehabilitation  THERAPY DIAG:  Chronic left-sided low back pain with left-sided sciatica  Radiculopathy, lumbar region  Cramp and spasm  ONSET DATE: 1 month ago  SUBJECTIVE:  SUBJECTIVE STATEMENT: Pain started about a year ago "just started hurting".  Mainly when she goes to bed at night or anytime when she lays down.  Its 10/10 at when lays down, keeps her from sleeping, she works 3rd shift.  Side sleeper.  Has tried Gabapentin and muscle relaxers without relief.   PERTINENT HISTORY:  Smoker, CAD, history  NSTEMI  PAIN:  Are you having pain? Yes: NPRS scale: 01/10 at rest, 10/10 at night Pain location: starts in Left leg, sometimes goes up to hip Pain description: ache and burning Aggravating factors: lying down after 5 min  Relieving factors: getting up  PRECAUTIONS: None  RED FLAGS: None   WEIGHT BEARING RESTRICTIONS: No  FALLS:  Has patient fallen in last 6 months? No  LIVING ENVIRONMENT: Lives with: lives alone Lives in: House/apartment Stairs: No Has following equipment at home: None  OCCUPATION: making eyeglasses, third shift, sitting primarily   PLOF: Independent  PATIENT GOALS: "I want to be able to sleep"  NEXT MD VISIT: 12/18/23 f/u with PCP  OBJECTIVE:   DIAGNOSTIC FINDINGS:  05/25/2023 MRI Lumbar Spine IMPRESSION: 1. Mild lumbar spine spondylosis as described above. 2. No acute osseous injury of the lumbar spine.  PATIENT SURVEYS:  Modified Oswestry 11/50   COGNITION: Overall cognitive status: Within functional limits for tasks assessed     SENSATION: WFL  MUSCLE LENGTH: Hamstrings: Right 90 deg; Left ~70 deg Prone knee bend: Right 90 deg; Left ~60 deg  POSTURE: No Significant postural limitations  PALPATION: Tenderness L lumbar paraspinals, increased edema bilateral ankles (R ankle 22 cm, L ankle 23 cm non-pitting)   LUMBAR ROM:   AROM eval  Flexion To shins, pain with return left side low back  Extension WNL, pressure low back  Right lateral flexion To knee, pulls on L  Left lateral flexion To knee   Right rotation Catches on Le  Left rotation WNL   (Blank rows = not tested)  LOWER EXTREMITY ROM:      LOWER EXTREMITY MMT:    MMT Right eval Left eval  Hip flexion 5* 4+*p! In back  Hip extension    Hip abduction 5* 5*  Hip adduction 4+* 4+* p! In back  Knee flexion 5 4+ p! In back  Knee extension 5 5 p! In back  Ankle dorsiflexion 5 5  Ankle plantarflexion 5 5   (Blank rows = not tested) * tested in sitting   Passive  Accessory Intervertebral Motion (PAIVM) Pt confirmsreproduction of back pain with CPA L4-L5 and L UPA L4-L5 . Generally hypomobile throughout    SPECIAL TESTS Lumbar Radiculopathy and Discogenic: Centralization and Peripheralization (SN 92, -LR 0.12): Not examined SLR (SN 92, -LR 0.29): R: Negative L:  Positive Crossed SLR (SP 90): R: Negative L: Negative   Facet Joint: Extension-Rotation (SN 100, -LR 0.0): R: Positive L: Positive  Hip: FABER (SN 81): R: Negative L: Positive FADIR (SN 94): R: Negative L: Negative Hip scour (SN 50): R: Negative L: Negative   SIJ:  Thigh Thrust (SN 88, -LR 0.18) : R: Negative L: Positive   Functional Tasks Sit to stand: 5x sit to stand 15.4 seconds   GAIT: Distance walked: 82' Assistive device utilized: None Level of assistance: Complete Independence Comments: decreased gait speed but no significant deviations   TODAY'S TREATMENT:  DATE:   11/09/23 EVAL Self Care: Education on sleep positions to try to decrease nerve compression irritation.  Handout provided.  Also counseled on smoking cessation.  Neuromuscular Reeducation: to decrease nerve irritability and centralize symptoms.  Prone lying Prone knee bends Side glides - left side to wall - tolerated without pain Lumbar extension - demo today    PATIENT EDUCATION:  Education details: education on HEP, sleep positioning Person educated: Patient Education method: Programmer, multimedia, Demonstration, Verbal cues, and Handouts Education comprehension: verbalized understanding and returned demonstration  HOME EXERCISE PROGRAM: Access Code: [email or web address] URL: [email or web address] Date: 11/09/2023 Prepared by: Harrie Foreman  Exercises - Lying Prone  - 1 x daily - 7 x weekly - 3 sets - 10 reps - Prone Knee Flexion  - 1 x daily - 7 x weekly - 1 sets - 10  reps - Left Standing Lateral Shift Correction at Wall - Repetitions  - 3 x daily - 7 x weekly - 1 sets - 10 reps - Standing Lumbar Extension at Wall - Forearms  - 3 x daily - 7 x weekly - 1 sets - 10 reps  Patient Education - Sleep Positions  ASSESSMENT:  CLINICAL IMPRESSION: Jaime Harris  is a pleasant 68 year-old female referred for left lumbar radiculopathy. PT examination demonstrates deficits, suggesting nerve root irritation L4-5.  Noted edema bilaterally, worse on Left side, some tenderness in L calf but did not worsen with calf squeeze and no redness or other signs DVT.  Educated on sleep positions, her preferred sleeping position is on side which may be causing compression, suggested modifications for side and back, also started with McKenzie extension program today. Jaime Harris presents with deficits in strength, mobility, range of motion, and pain. Patient will benefit from skilled PT services to address deficits and return to pain-free function at home and work.   OBJECTIVE IMPAIRMENTS: decreased activity tolerance, decreased ROM, decreased strength, hypomobility, increased edema, increased fascial restrictions, impaired perceived functional ability, increased muscle spasms, and pain.   ACTIVITY LIMITATIONS: lifting, bending, sitting, sleeping, and transfers  PARTICIPATION LIMITATIONS: occupation  PERSONAL FACTORS: Time since onset of injury/illness/exacerbation and 3+ comorbidities: Smoker, CAD, history NSTEMI, chronic LBP  are also affecting patient's functional outcome.   REHAB POTENTIAL: Good  CLINICAL DECISION MAKING: Evolving/moderate complexity  EVALUATION COMPLEXITY: Moderate   GOALS: Goals reviewed with patient? Yes  SHORT TERM GOALS: Target date: 11/23/2023   Patient will be independent with initial HEP.  Baseline: given Goal status: INITIAL  2.   Patient will report centralization of radicular symptoms.  Baseline: down to L ankle Goal status:  INITIAL  LONG TERM GOALS: Target date: 12/21/2023   Patient will be independent with advanced/ongoing HEP to improve outcomes and carryover.  Baseline:  Goal status: INITIAL  2.  Patient will report 75% improvement in low back/LLE  pain to improve QOL.  Baseline:  Goal status: INITIAL  3.  Patient will demonstrate full pain free lumbar ROM to perform ADLs.   Baseline: see objective Goal status: INITIAL  4. Patient will report being able to sleep without interruption from LLE pain.  Baseline: LLE pain wakes after laying down for 5 min  Goal status: INITIAL  5.  Patient will report at least 6 points improvement on modified Oswestry to demonstrate improved functional ability.  Baseline: 11/50 Goal status: INITIAL      PLAN:  PT FREQUENCY: 1-2x/week  PT DURATION: 6 weeks  PLANNED INTERVENTIONS: 97110-Therapeutic exercises, 97530- Therapeutic activity, [id number]- Neuromuscular re-education, 97535-  Self Care, [id number]- Manual therapy, [phone]- Gait training, [phone]- Orthotic Fit/training, [phone]- Electrical stimulation (unattended), [phone]- Electrical stimulation (manual), [id number]- Ultrasound, [id number]- Traction (mechanical), Patient/Family education, Balance training, Stair training, Taping, Dry Needling, Joint mobilization, Joint manipulation, Spinal manipulation, Spinal mobilization, Cryotherapy, and Moist heat.  PLAN FOR NEXT SESSION: progress McKenzie extension exercises as tolerated, manual therapy, modalities PRN.  Traction?   Jena Gauss, PT 11/09/2023, 12:20 PM

## 2023-11-09 ENCOUNTER — Encounter: Payer: Self-pay | Admitting: Physical Therapy

## 2023-11-09 ENCOUNTER — Ambulatory Visit: Payer: BC Managed Care – PPO | Attending: Family | Admitting: Physical Therapy

## 2023-11-09 ENCOUNTER — Telehealth: Payer: Self-pay

## 2023-11-09 ENCOUNTER — Other Ambulatory Visit: Payer: Self-pay

## 2023-11-09 DIAGNOSIS — R252 Cramp and spasm: Secondary | ICD-10-CM | POA: Diagnosis not present

## 2023-11-09 DIAGNOSIS — M5416 Radiculopathy, lumbar region: Secondary | ICD-10-CM | POA: Insufficient documentation

## 2023-11-09 DIAGNOSIS — M5442 Lumbago with sciatica, left side: Secondary | ICD-10-CM | POA: Insufficient documentation

## 2023-11-09 DIAGNOSIS — G8929 Other chronic pain: Secondary | ICD-10-CM | POA: Diagnosis not present

## 2023-11-09 NOTE — Telephone Encounter (Signed)
 Copied from CRM [id number]. Topic: Clinical - Medical Advice >> Nov 09, 2023  9:19 AM Kathryne Eriksson wrote: Reason for CRM: gabapentin (NEURONTIN) 300 MG capsule >> Nov 09, 2023  9:20 AM Kathryne Eriksson wrote: Patient is requesting a call back at [phone], patient states the gabapentin (NEURONTIN) 300 MG capsule isn't helping with the pain at all.

## 2023-11-09 NOTE — Telephone Encounter (Signed)
 Unfortunately, she will need a face to face office visit if I am to prescribe anything stronger than gabapentin.

## 2023-11-09 NOTE — Telephone Encounter (Signed)
 Pt has been scheduled.

## 2023-11-12 ENCOUNTER — Ambulatory Visit: Payer: BC Managed Care – PPO | Admitting: Physical Therapy

## 2023-11-13 ENCOUNTER — Ambulatory Visit: Payer: BC Managed Care – PPO | Attending: Family

## 2023-11-13 DIAGNOSIS — M5442 Lumbago with sciatica, left side: Secondary | ICD-10-CM | POA: Insufficient documentation

## 2023-11-13 DIAGNOSIS — G8929 Other chronic pain: Secondary | ICD-10-CM | POA: Insufficient documentation

## 2023-11-13 DIAGNOSIS — R252 Cramp and spasm: Secondary | ICD-10-CM | POA: Insufficient documentation

## 2023-11-13 DIAGNOSIS — M5416 Radiculopathy, lumbar region: Secondary | ICD-10-CM | POA: Diagnosis not present

## 2023-11-13 NOTE — Therapy (Signed)
 OUTPATIENT PHYSICAL THERAPY THORACOLUMBAR TREATMENT   Patient Name: Jaime Harris MRN: [id number] DOB:1956/04/25, 68 y.o., female Today's Date: 11/13/2023  END OF SESSION:  PT End of Session - 11/13/23 0849     Visit Number 2    Date for PT Re-Evaluation 12/21/23    Authorization Type BCBS    PT Start Time 0845    PT Stop Time 0927    PT Time Calculation (min) 42 min    Activity Tolerance Patient tolerated treatment well    Behavior During Therapy [facility] for tasks assessed/performed              Past Medical History:  Diagnosis Date   Aneurysm (HCC) 2020   supraclinoid R int carotid artery   CAD (coronary artery disease)    LHC 01/03/2022: 30% of pRCA followed by 80% of mRCA s/p DES, 20% of pLAD, &30% of p-mLCX   Chronic tension headaches    Headache    Hyperlipidemia    borderline   Non-ST elevation (NSTEMI) myocardial infarction (HCC) 01/03/2022   Sickle cell trait (HCC)    Thyroid disease    enlarged thyroid   Vertigo    Vitamin D deficiency    Past Surgical History:  Procedure Laterality Date   COLONOSCOPY  2016   COLONOSCOPY WITH PROPOFOL  10/27/2022   CORONARY PRESSURE/FFR STUDY N/A 01/03/2022   Procedure: INTRAVASCULAR PRESSURE WIRE/FFR STUDY;  Surgeon: Kathleene Hazel, MD;  Location: [facility];  Service: Cardiovascular;  Laterality: N/A;   CORONARY STENT INTERVENTION N/A 01/03/2022   Procedure: CORONARY STENT INTERVENTION;  Surgeon: Kathleene Hazel, MD;  Location: [facility];  Service: Cardiovascular;  Laterality: N/A;   FOOT SURGERY Bilateral    Pt states she had bones removed from the sides of her feet and middle toes so she could walk better.   LEFT HEART CATH AND CORONARY ANGIOGRAPHY N/A 01/03/2022   Procedure: LEFT HEART CATH AND CORONARY ANGIOGRAPHY;  Surgeon: Kathleene Hazel, MD;  Location: [facility];  Service: Cardiovascular;  Laterality: N/A;   TONSILLECTOMY  09/11/1973   TUBAL LIGATION      Patient Active Problem List   Diagnosis Date Noted   Left shoulder pain 09/19/2023   Soft tissue mass 09/19/2023   Excessive tear production of left lacrimal gland 08/17/2023   Whiplash injuries, initial encounter 02/27/2023   Constipation 02/13/2023   Bilateral foot pain 12/22/2022   Preventative health care 04/14/2022   Ceruminosis, bilateral 04/14/2022   CAD (coronary artery disease) 01/04/2022   Carotid aneurysm, right (HCC) 01/04/2022   Essential hypertension    Hyperlipidemia    History of non-ST elevation myocardial infarction (NSTEMI) 01/03/2022   Overactive bladder 04/12/2021   Lumbar radiculopathy 01/04/2021   Gastroesophageal reflux disease 10/13/2020   Other intervertebral disc degeneration, lumbar region 04/14/2020   Obstructive sleep apnea 11/04/2018   Vitamin D deficiency 12/30/2014   Migraine without status migrainosus, not intractable 12/25/2014   Tobacco abuse 12/25/2014   Carpal tunnel syndrome 12/25/2014   Thyromegaly 11/16/2014   IBS (irritable bowel syndrome) 11/16/2014    PCP: Sandford Craze, NP   REFERRING PROVIDER: Sandford Craze, NP   REFERRING DIAG: M54.16 (ICD-10-CM) - Lumbar radiculopathy   Rationale for Evaluation and Treatment: Rehabilitation  THERAPY DIAG:  Chronic left-sided low back pain with left-sided sciatica  Radiculopathy, lumbar region  Cramp and spasm  ONSET DATE: 1 month ago  SUBJECTIVE:  SUBJECTIVE STATEMENT: Pt reports she was hurting worse early this morning, pain still going down her leg  PERTINENT HISTORY:  Smoker, CAD, history NSTEMI  PAIN:  Are you having pain? Yes: NPRS scale: 7/10 Pain location: starts in Left leg, sometimes goes up to hip Pain description: ache, just hurts Aggravating factors: lying down after 5  min  Relieving factors: getting up  PRECAUTIONS: None  RED FLAGS: None   WEIGHT BEARING RESTRICTIONS: No  FALLS:  Has patient fallen in last 6 months? No  LIVING ENVIRONMENT: Lives with: lives alone Lives in: House/apartment Stairs: No Has following equipment at home: None  OCCUPATION: making eyeglasses, third shift, sitting primarily   PLOF: Independent  PATIENT GOALS: "I want to be able to sleep"  NEXT MD VISIT: 12/18/23 f/u with PCP  OBJECTIVE:   DIAGNOSTIC FINDINGS:  05/25/2023 MRI Lumbar Spine IMPRESSION: 1. Mild lumbar spine spondylosis as described above. 2. No acute osseous injury of the lumbar spine.  PATIENT SURVEYS:  Modified Oswestry 11/50   COGNITION: Overall cognitive status: Within functional limits for tasks assessed     SENSATION: WFL  MUSCLE LENGTH: Hamstrings: Right 90 deg; Left ~70 deg Prone knee bend: Right 90 deg; Left ~60 deg  POSTURE: No Significant postural limitations  PALPATION: Tenderness L lumbar paraspinals, increased edema bilateral ankles (R ankle 22 cm, L ankle 23 cm non-pitting)   LUMBAR ROM:   AROM eval  Flexion To shins, pain with return left side low back  Extension WNL, pressure low back  Right lateral flexion To knee, pulls on L  Left lateral flexion To knee   Right rotation Catches on Le  Left rotation WNL   (Blank rows = not tested)  LOWER EXTREMITY ROM:      LOWER EXTREMITY MMT:    MMT Right eval Left eval  Hip flexion 5* 4+*p! In back  Hip extension    Hip abduction 5* 5*  Hip adduction 4+* 4+* p! In back  Knee flexion 5 4+ p! In back  Knee extension 5 5 p! In back  Ankle dorsiflexion 5 5  Ankle plantarflexion 5 5   (Blank rows = not tested) * tested in sitting   Passive Accessory Intervertebral Motion (PAIVM) Pt confirmsreproduction of back pain with CPA L4-L5 and L UPA L4-L5 . Generally hypomobile throughout    SPECIAL TESTS Lumbar Radiculopathy and Discogenic: Centralization and  Peripheralization (SN 92, -LR 0.12): Not examined SLR (SN 92, -LR 0.29): R: Negative L:  Positive Crossed SLR (SP 90): R: Negative L: Negative   Facet Joint: Extension-Rotation (SN 100, -LR 0.0): R: Positive L: Positive  Hip: FABER (SN 81): R: Negative L: Positive FADIR (SN 94): R: Negative L: Negative Hip scour (SN 50): R: Negative L: Negative   SIJ:  Thigh Thrust (SN 88, -LR 0.18) : R: Negative L: Positive   Functional Tasks Sit to stand: 5x sit to stand 15.4 seconds   GAIT: Distance walked: 34' Assistive device utilized: None Level of assistance: Complete Independence Comments: decreased gait speed but no significant deviations   TODAY'S TREATMENT:  DATE:  11/13/23 Therapeutic Exercise: to improve strength and mobility.  Demo, verbal and tactile cues throughout for technique.  Nustep L3x54min UE/LE Supine LTR x 10 both ways Supine TrA bracing 10x3" Bridges with TrA  x 10 Supine hip ER/IR x 10 both ways  NEUROMUSCULAR RE-EDUCATION: to relieve radicular symptoms Prone on elbows 1 min hold 2x Prone knee flexion x 10 B Prone press up x 8 Manual Sciatic nerve glide supine  L long leg distraction  11/09/23 EVAL Self Care: Education on sleep positions to try to decrease nerve compression irritation.  Handout provided.  Also counseled on smoking cessation.  Neuromuscular Reeducation: to decrease nerve irritability and centralize symptoms.  Prone lying Prone knee bends Side glides - left side to wall - tolerated without pain Lumbar extension - demo today    PATIENT EDUCATION:  Education details: HEP Person educated: Patient Education method: Programmer, multimedia, Facilities manager, Verbal cues, and Handouts Education comprehension: verbalized understanding and returned demonstration  HOME EXERCISE PROGRAM: Access Code: [email or web address] URL:  [email or web address] Date: 11/13/2023 Prepared by: Verta Ellen  Exercises - Lying Prone  - 1 x daily - 7 x weekly - 3 sets - 10 reps - Prone Knee Flexion  - 1 x daily - 7 x weekly - 1 sets - 10 reps - Left Standing Lateral Shift Correction at Wall - Repetitions  - 3 x daily - 7 x weekly - 1 sets - 10 reps - Standing Lumbar Extension at Wall - Forearms  - 3 x daily - 7 x weekly - 1 sets - 10 reps - Prone Press Up On Elbows  - 1 x daily - 7 x weekly - 2 sets - 10 reps - 1 min hold hold - Prone Off Center Lumbar Extension Press Up  - 1 x daily - 7 x weekly - 2 sets - 10 reps  Patient Education - Sleep Positions  ASSESSMENT:  CLINICAL IMPRESSION:  Progressed patient through interventions to improve lumbo-pelvic mobility, strength and reduce radicular sx. She was pretty tight with L hip ER. Noted improvement in her pain level after the session. Provided updated HEP including POE and press ups to continue addressing radicular pain. Patient will benefit from skilled PT services to address deficits and return to pain-free function at home and work.   OBJECTIVE IMPAIRMENTS: decreased activity tolerance, decreased ROM, decreased strength, hypomobility, increased edema, increased fascial restrictions, impaired perceived functional ability, increased muscle spasms, and pain.   ACTIVITY LIMITATIONS: lifting, bending, sitting, sleeping, and transfers  PARTICIPATION LIMITATIONS: occupation  PERSONAL FACTORS: Time since onset of injury/illness/exacerbation and 3+ comorbidities: Smoker, CAD, history NSTEMI, chronic LBP  are also affecting patient's functional outcome.   REHAB POTENTIAL: Good  CLINICAL DECISION MAKING: Evolving/moderate complexity  EVALUATION COMPLEXITY: Moderate   GOALS: Goals reviewed with patient? Yes  SHORT TERM GOALS: Target date: 11/23/2023   Patient will be independent with initial HEP.  Baseline: given Goal status: INITIAL  2.   Patient will report  centralization of radicular symptoms.  Baseline: down to L ankle Goal status: INITIAL  LONG TERM GOALS: Target date: 12/21/2023   Patient will be independent with advanced/ongoing HEP to improve outcomes and carryover.  Baseline:  Goal status: INITIAL  2.  Patient will report 75% improvement in low back/LLE  pain to improve QOL.  Baseline:  Goal status: INITIAL  3.  Patient will demonstrate full pain free lumbar ROM to perform ADLs.   Baseline: see objective Goal status: INITIAL  4. Patient will report being able to  sleep without interruption from LLE pain.  Baseline: LLE pain wakes after laying down for 5 min  Goal status: INITIAL  5.  Patient will report at least 6 points improvement on modified Oswestry to demonstrate improved functional ability.  Baseline: 11/50 Goal status: INITIAL      PLAN:  PT FREQUENCY: 1-2x/week  PT DURATION: 6 weeks  PLANNED INTERVENTIONS: 97110-Therapeutic exercises, 97530- Therapeutic activity, 97112- Neuromuscular re-education, 97535- Self Care, 16109- Manual therapy, [phone]- Gait training, [phone]- Orthotic Fit/training, [phone]- Electrical stimulation (unattended), [phone]- Electrical stimulation (manual), [id number]- Ultrasound, 13086- Traction (mechanical), Patient/Family education, Balance training, Stair training, Taping, Dry Needling, Joint mobilization, Joint manipulation, Spinal manipulation, Spinal mobilization, Cryotherapy, and Moist heat.  PLAN FOR NEXT SESSION: progress McKenzie extension exercises as tolerated, manual therapy, modalities PRN.  Traction?   Darleene Cleaver, PTA 11/13/2023, 9:45 AM

## 2023-11-14 ENCOUNTER — Ambulatory Visit: Payer: BC Managed Care – PPO | Admitting: Family

## 2023-11-19 ENCOUNTER — Ambulatory Visit: Payer: Self-pay | Admitting: Physical Therapy

## 2023-11-19 ENCOUNTER — Encounter: Payer: Self-pay | Admitting: Physical Therapy

## 2023-11-19 DIAGNOSIS — R252 Cramp and spasm: Secondary | ICD-10-CM | POA: Diagnosis not present

## 2023-11-19 DIAGNOSIS — M5442 Lumbago with sciatica, left side: Secondary | ICD-10-CM | POA: Diagnosis not present

## 2023-11-19 DIAGNOSIS — M5416 Radiculopathy, lumbar region: Secondary | ICD-10-CM

## 2023-11-19 DIAGNOSIS — G8929 Other chronic pain: Secondary | ICD-10-CM | POA: Diagnosis not present

## 2023-11-19 NOTE — Therapy (Signed)
 OUTPATIENT PHYSICAL THERAPY THORACOLUMBAR TREATMENT   Patient Name: Jaime Harris MRN: [id number] DOB:02/03/1956, 68 y.o., female Today's Date: 11/19/2023  END OF SESSION:  PT End of Session - 11/19/23 1148     Visit Number 3    Date for PT Re-Evaluation 12/21/23    Authorization Type BCBS    PT Start Time 1148    PT Stop Time 1233    PT Time Calculation (min) 45 min    Activity Tolerance Patient tolerated treatment well    Behavior During Therapy WFL for tasks assessed/performed              Past Medical History:  Diagnosis Date   Aneurysm (HCC) 2020   supraclinoid R int carotid artery   CAD (coronary artery disease)    LHC 01/03/2022: 30% of pRCA followed by 80% of mRCA s/p DES, 20% of pLAD, &30% of p-mLCX   Chronic tension headaches    Headache    Hyperlipidemia    borderline   Non-ST elevation (NSTEMI) myocardial infarction (HCC) 01/03/2022   Sickle cell trait (HCC)    Thyroid disease    enlarged thyroid   Vertigo    Vitamin D deficiency    Past Surgical History:  Procedure Laterality Date   COLONOSCOPY  2016   COLONOSCOPY WITH PROPOFOL  10/27/2022   CORONARY PRESSURE/FFR STUDY N/A 01/03/2022   Procedure: INTRAVASCULAR PRESSURE WIRE/FFR STUDY;  Surgeon: Kathleene Hazel, MD;  Location: [facility];  Service: Cardiovascular;  Laterality: N/A;   CORONARY STENT INTERVENTION N/A 01/03/2022   Procedure: CORONARY STENT INTERVENTION;  Surgeon: Kathleene Hazel, MD;  Location: [facility];  Service: Cardiovascular;  Laterality: N/A;   FOOT SURGERY Bilateral    Pt states she had bones removed from the sides of her feet and middle toes so she could walk better.   LEFT HEART CATH AND CORONARY ANGIOGRAPHY N/A 01/03/2022   Procedure: LEFT HEART CATH AND CORONARY ANGIOGRAPHY;  Surgeon: Kathleene Hazel, MD;  Location: [facility];  Service: Cardiovascular;  Laterality: N/A;   TONSILLECTOMY  09/11/1973   TUBAL LIGATION      Patient Active Problem List   Diagnosis Date Noted   Left shoulder pain 09/19/2023   Soft tissue mass 09/19/2023   Excessive tear production of left lacrimal gland 08/17/2023   Whiplash injuries, initial encounter 02/27/2023   Constipation 02/13/2023   Bilateral foot pain 12/22/2022   Preventative health care 04/14/2022   Ceruminosis, bilateral 04/14/2022   CAD (coronary artery disease) 01/04/2022   Carotid aneurysm, right (HCC) 01/04/2022   Essential hypertension    Hyperlipidemia    History of non-ST elevation myocardial infarction (NSTEMI) 01/03/2022   Overactive bladder 04/12/2021   Lumbar radiculopathy 01/04/2021   Gastroesophageal reflux disease 10/13/2020   Other intervertebral disc degeneration, lumbar region 04/14/2020   Obstructive sleep apnea 11/04/2018   Vitamin D deficiency 12/30/2014   Migraine without status migrainosus, not intractable 12/25/2014   Tobacco abuse 12/25/2014   Carpal tunnel syndrome 12/25/2014   Thyromegaly 11/16/2014   IBS (irritable bowel syndrome) 11/16/2014    PCP: Sandford Craze, NP   REFERRING PROVIDER: Sandford Craze, NP   REFERRING DIAG: M54.16 (ICD-10-CM) - Lumbar radiculopathy   Rationale for Evaluation and Treatment: Rehabilitation  THERAPY DIAG:  Chronic left-sided low back pain with left-sided sciatica  Radiculopathy, lumbar region  Cramp and spasm  ONSET DATE: 1 month ago  SUBJECTIVE:  SUBJECTIVE STATEMENT: Pt reports she is doing ok today.   Pain still going down her leg.   Pain varies, Saturday night was bad.   PERTINENT HISTORY:  Smoker, CAD, history NSTEMI  PAIN:  Are you having pain? Yes: NPRS scale: 2/10 Pain location: starts in Left leg, sometimes goes up to hip Pain description: ache, just hurts Aggravating  factors: lying down after 5 min  Relieving factors: getting up  PRECAUTIONS: None  RED FLAGS: None   WEIGHT BEARING RESTRICTIONS: No  FALLS:  Has patient fallen in last 6 months? No  LIVING ENVIRONMENT: Lives with: lives alone Lives in: House/apartment Stairs: No Has following equipment at home: None  OCCUPATION: making eyeglasses, third shift, sitting primarily   PLOF: Independent  PATIENT GOALS: "I want to be able to sleep"  NEXT MD VISIT: 12/18/23 f/u with PCP  OBJECTIVE:   DIAGNOSTIC FINDINGS:  05/25/2023 MRI Lumbar Spine IMPRESSION: 1. Mild lumbar spine spondylosis as described above. 2. No acute osseous injury of the lumbar spine.  PATIENT SURVEYS:  Modified Oswestry 11/50   COGNITION: Overall cognitive status: Within functional limits for tasks assessed     SENSATION: WFL  MUSCLE LENGTH: Hamstrings: Right 90 deg; Left ~70 deg Prone knee bend: Right 90 deg; Left ~60 deg  POSTURE: No Significant postural limitations  PALPATION: Tenderness L lumbar paraspinals, increased edema bilateral ankles (R ankle 22 cm, L ankle 23 cm non-pitting)   LUMBAR ROM:   AROM eval  Flexion To shins, pain with return left side low back  Extension WNL, pressure low back  Right lateral flexion To knee, pulls on L  Left lateral flexion To knee   Right rotation Catches on Le  Left rotation WNL   (Blank rows = not tested)  LOWER EXTREMITY ROM:      LOWER EXTREMITY MMT:    MMT Right eval Left eval  Hip flexion 5* 4+*p! In back  Hip extension    Hip abduction 5* 5*  Hip adduction 4+* 4+* p! In back  Knee flexion 5 4+ p! In back  Knee extension 5 5 p! In back  Ankle dorsiflexion 5 5  Ankle plantarflexion 5 5   (Blank rows = not tested) * tested in sitting   Passive Accessory Intervertebral Motion (PAIVM) Pt confirmsreproduction of back pain with CPA L4-L5 and L UPA L4-L5 . Generally hypomobile throughout    SPECIAL TESTS Lumbar Radiculopathy and  Discogenic: Centralization and Peripheralization (SN 92, -LR 0.12): Not examined SLR (SN 92, -LR 0.29): R: Negative L:  Positive Crossed SLR (SP 90): R: Negative L: Negative   Facet Joint: Extension-Rotation (SN 100, -LR 0.0): R: Positive L: Positive  Hip: FABER (SN 81): R: Negative L: Positive FADIR (SN 94): R: Negative L: Negative Hip scour (SN 50): R: Negative L: Negative   SIJ:  Thigh Thrust (SN 88, -LR 0.18) : R: Negative L: Positive   Functional Tasks Sit to stand: 5x sit to stand 15.4 seconds   GAIT: Distance walked: 2' Assistive device utilized: None Level of assistance: Complete Independence Comments: decreased gait speed but no significant deviations   TODAY'S TREATMENT:  DATE:   11/19/23 Therapeutic Exercise: to improve strength and mobility.  Demo, verbal and tactile cues throughout for technique.  Nustep L5 x 6 min  Prone leg extensions x 10 R/L Supine bridges x 10  Ant/Post pelvic tilts x 10  NEUROMUSCULAR RE-EDUCATION: to relieve radicular symptoms L side glides x 10 Lumbar extensions at wall x 10  Prone press-ups x 10  Prone press-up with overpressure on L Lumbar spine x 10 Prone knee flexion x 10 B Dynamic Sciatic nerve glide supine and sitting  Manual Therapy: to decrease muscle spasm and pain and improve mobility L UPA mobs to L lumbar spine Manual lumbar traction with legs over ball    11/13/23 Therapeutic Exercise: to improve strength and mobility.  Demo, verbal and tactile cues throughout for technique.  Nustep L3x44min UE/LE Supine LTR x 10 both ways Supine TrA bracing 10x3" Bridges with TrA  x 10 Supine hip ER/IR x 10 both ways  NEUROMUSCULAR RE-EDUCATION: to relieve radicular symptoms Prone on elbows 1 min hold 2x Prone knee flexion x 10 B Prone press up x 8 Manual Sciatic nerve glide supine  L long leg  distraction  11/09/23 EVAL Self Care: Education on sleep positions to try to decrease nerve compression irritation.  Handout provided.  Also counseled on smoking cessation.  Neuromuscular Reeducation: to decrease nerve irritability and centralize symptoms.  Prone lying Prone knee bends Side glides - left side to wall - tolerated without pain Lumbar extension - demo today    PATIENT EDUCATION:  Education details: HEP update Person educated: Patient Education method: Explanation, Demonstration, Verbal cues, and Handouts Education comprehension: verbalized understanding and returned demonstration  HOME EXERCISE PROGRAM: Access Code: [email or web address] URL: [email or web address] Date: 11/19/2023 Prepared by: Harrie Foreman  Exercises - Lying Prone  - 1 x daily - 7 x weekly - 3 sets - 10 reps - Prone Knee Flexion  - 1 x daily - 7 x weekly - 1 sets - 10 reps - Left Standing Lateral Shift Correction at Wall - Repetitions  - 3 x daily - 7 x weekly - 1 sets - 10 reps - Standing Lumbar Extension at Wall - Forearms  - 3 x daily - 7 x weekly - 1 sets - 10 reps - Prone Press Up On Elbows  - 1 x daily - 7 x weekly - 2 sets - 10 reps - 1 min hold hold - Prone Off Center Lumbar Extension Press Up  - 1 x daily - 7 x weekly - 2 sets - 10 reps - Standing Lumbar Extension  - 10 x daily - 7 x weekly - 1 sets - 10 reps - Supine Lower Trunk Rotation  - 1 x daily - 7 x weekly - 1 sets - 10 reps - Supine Posterior Pelvic Tilt  - 1 x daily - 7 x weekly - 1 sets - 10 reps - Supine Bridge  - 1 x daily - 7 x weekly - 1 sets - 10 reps - Supine 90/90 Sciatic Nerve Glide with Knee Flexion/Extension  - 1 x daily - 7 x weekly - 3 sets - 10 reps  Patient Education - Sleep Positions  ASSESSMENT:  CLINICAL IMPRESSION: Jaime Harris reports still having pain primarily at night, was unable to change sleep positioning successfully as did not like sleeping on back.  Still having L sided radicular  symptoms, reviewed HEP, able to do independently, meeting STG #1.  Standing lumbar extension relieved pain the most, added to HEP today.  Still very tender over L side lower lumber side, reporting catch there with LTR.  Overall good tolerance of today's interventions, Jaime Harris continues to demonstrate potential for improvement and would benefit from continued skilled therapy to address impairments.     OBJECTIVE IMPAIRMENTS: decreased activity tolerance, decreased ROM, decreased strength, hypomobility, increased edema, increased fascial restrictions, impaired perceived functional ability, increased muscle spasms, and pain.   ACTIVITY LIMITATIONS: lifting, bending, sitting, sleeping, and transfers  PARTICIPATION LIMITATIONS: occupation  PERSONAL FACTORS: Time since onset of injury/illness/exacerbation and 3+ comorbidities: Smoker, CAD, history NSTEMI, chronic LBP  are also affecting patient's functional outcome.   REHAB POTENTIAL: Good  CLINICAL DECISION MAKING: Evolving/moderate complexity  EVALUATION COMPLEXITY: Moderate   GOALS: Goals reviewed with patient? Yes  SHORT TERM GOALS: Target date: 11/23/2023   Patient will be independent with initial HEP.  Baseline: given Goal status: MET 11/19/23  2.   Patient will report centralization of radicular symptoms.  Baseline: down to L ankle Goal status: IN PROGRESS  LONG TERM GOALS: Target date: 12/21/2023   Patient will be independent with advanced/ongoing HEP to improve outcomes and carryover.  Baseline:  Goal status: IN PROGRESS  2.  Patient will report 75% improvement in low back/LLE  pain to improve QOL.  Baseline:  Goal status: IN PROGRESS  3.  Patient will demonstrate full pain free lumbar ROM to perform ADLs.   Baseline: see objective Goal status: IN PROGRESS  4. Patient will report being able to sleep without interruption from LLE pain.  Baseline: LLE pain wakes after laying down for 5 min  Goal status: IN  PROGRESS  5.  Patient will report at least 6 points improvement on modified Oswestry to demonstrate improved functional ability.  Baseline: 11/50 Goal status: IN PROGRESS      PLAN:  PT FREQUENCY: 1-2x/week  PT DURATION: 6 weeks  PLANNED INTERVENTIONS: 97110-Therapeutic exercises, 97530- Therapeutic activity, 97112- Neuromuscular re-education, 97535- Self Care, [id number]- Manual therapy, [phone]- Gait training, [phone]- Orthotic Fit/training, [phone]- Electrical stimulation (unattended), [phone]- Electrical stimulation (manual), [id number]- Ultrasound, 69629- Traction (mechanical), Patient/Family education, Balance training, Stair training, Taping, Dry Needling, Joint mobilization, Joint manipulation, Spinal manipulation, Spinal mobilization, Cryotherapy, and Moist heat.  PLAN FOR NEXT SESSION: progress McKenzie extension exercises as tolerated, manual therapy, modalities PRN.  Traction?   Jena Gauss, PT 11/19/2023, 12:53 PM

## 2023-11-23 ENCOUNTER — Encounter: Payer: BC Managed Care – PPO | Admitting: Physical Therapy

## 2023-11-28 ENCOUNTER — Ambulatory Visit (INDEPENDENT_AMBULATORY_CARE_PROVIDER_SITE_OTHER): Admitting: Family

## 2023-11-28 VITALS — BP 139/69 | HR 94 | Temp 98.6°F | Resp 16 | Ht 65.0 in | Wt 186.6 lb

## 2023-11-28 DIAGNOSIS — Z720 Tobacco use: Secondary | ICD-10-CM

## 2023-11-28 DIAGNOSIS — M5416 Radiculopathy, lumbar region: Secondary | ICD-10-CM

## 2023-11-28 MED ORDER — TRAMADOL HCL 50 MG PO TABS
50.0000 mg | ORAL_TABLET | Freq: Every evening | ORAL | 0 refills | Status: AC | PRN
Start: 2023-11-28 — End: 2023-12-28

## 2023-11-28 MED ORDER — VARENICLINE TARTRATE 0.5 MG PO TABS
0.5000 mg | ORAL_TABLET | Freq: Two times a day (BID) | ORAL | 1 refills | Status: DC
Start: 2023-11-28 — End: 2023-12-21

## 2023-11-28 NOTE — Assessment & Plan Note (Signed)
 Still working on quitting smoking.  Would like refill on chantix.

## 2023-11-28 NOTE — Patient Instructions (Signed)
 VISIT SUMMARY:  Today, we discussed your ongoing left leg pain, which primarily occurs at night and radiates down your leg. We reviewed your previous treatments, including physical therapy and medications, which have not provided relief.  YOUR PLAN:  -LEFT LEG PAIN: Your left leg pain is likely due to chronic nerve pain related to your lumbar spine. We have prescribed tramadol to be taken at bedtime to help manage the pain and improve your sleep. You can also take acetaminophen with tramadol if needed. Remember to contact us for a refill before your medication runs out, as there are no automatic refills.

## 2023-11-28 NOTE — Progress Notes (Signed)
 Subjective:     Patient ID: Jaime Harris, female    DOB: [birth year], 68 y.o.   MRN: [id number]  Chief Complaint  Patient presents with   Leg Pain    Patient complains of left leg pain, "gabapentin not working    Leg Pain     Discussed the use of AI scribe software for clinical note transcription with the patient, who gave verbal consent to proceed.  History of Present Illness  Jaime Harris is a 68 year old female who presents with left leg pain. She recalls having an MRI of her lower back in the fall, conducted by a Neurosurgeon, Dr. Maurice Small. He did not have any surgical recommendateions.   She experiences left leg pain that primarily occurs at night when lying down. The pain radiates down the left side of her leg, beginning approximately five minutes after lying down and persisting regardless of her position. Sitting up alleviates the pain. She has been undergoing physical therapy, which has not yet provided relief, and has tried using two pillows between her legs, but this has not helped.  The pain is mainly on the left side, extending from the hip down the leg. She also experiences discomfort in her lower back on the left side, especially when bending over to the right or squatting and then standing up. This back pain is described as a 'catch' that requires her to pause before fully straightening up.  She has previously tried gabapentin at bedtime, which helps her sleep but does not prevent the pain from waking her up. She has also used it in combination with medication for muscle spasms, but the pain persists.     Health Maintenance Due  Topic Date Due   Zoster Vaccines- Shingrix (1 of 2) Never done   COVID-19 Vaccine (2 - 2024-25 season) 05/13/2023    Past Medical History:  Diagnosis Date   Aneurysm (HCC) 2020   supraclinoid R int carotid artery   CAD (coronary artery disease)    LHC 01/03/2022: 30% of pRCA followed by 80% of mRCA s/p DES, 20% of pLAD, &30% of  p-mLCX   Chronic tension headaches    Headache    Hyperlipidemia    borderline   Non-ST elevation (NSTEMI) myocardial infarction (HCC) 01/03/2022   Sickle cell trait (HCC)    Thyroid disease    enlarged thyroid   Vertigo    Vitamin D deficiency     Past Surgical History:  Procedure Laterality Date   COLONOSCOPY  2016   COLONOSCOPY WITH PROPOFOL  10/27/2022   CORONARY PRESSURE/FFR STUDY N/A 01/03/2022   Procedure: INTRAVASCULAR PRESSURE WIRE/FFR STUDY;  Surgeon: Kathleene Hazel, MD;  Location: [facility];  Service: Cardiovascular;  Laterality: N/A;   CORONARY STENT INTERVENTION N/A 01/03/2022   Procedure: CORONARY STENT INTERVENTION;  Surgeon: Kathleene Hazel, MD;  Location: [facility];  Service: Cardiovascular;  Laterality: N/A;   FOOT SURGERY Bilateral    Pt states she had bones removed from the sides of her feet and middle toes so she could walk better.   LEFT HEART CATH AND CORONARY ANGIOGRAPHY N/A 01/03/2022   Procedure: LEFT HEART CATH AND CORONARY ANGIOGRAPHY;  Surgeon: Kathleene Hazel, MD;  Location: [facility];  Service: Cardiovascular;  Laterality: N/A;   TONSILLECTOMY  09/11/1973   TUBAL LIGATION      Family History  Problem Relation Age of Onset   Heart disease Mother  pacemaker/defibrillator   Diabetes Mother    Hypertension Sister    Diabetes Brother    Heart disease Paternal Grandmother        died of CHF   Diabetes Paternal Grandmother    Cancer Neg Hx    Kidney disease Neg Hx    Colon cancer Neg Hx    Colon polyps Neg Hx    Rectal cancer Neg Hx    Stomach cancer Neg Hx    Esophageal cancer Neg Hx     Social History   Socioeconomic History   Marital status: Single    Spouse name: Not on file   Number of children: 3   Years of education: Not on file   Highest education level: GED or equivalent  Occupational History    Comment: Conservation officer, nature  Tobacco Use   Smoking status: Every Day    Current  packs/day: 0.50    Types: Cigarettes    Passive exposure: Never   Smokeless tobacco: Never  Vaping Use   Vaping status: Never Used  Substance and Sexual Activity   Alcohol use: Yes    Comment: occasional   Drug use: No   Sexual activity: Not Currently    Partners: Male    Birth control/protection: Post-menopausal  Other Topics Concern   Not on file  Social History Narrative   Makes eye glasses.     Single   GED   She has 3 children   58- Son Mellody Dance- lives locally- he has one son   21- Daughter Artist Pais- 7 children (4 of these children live with the pt- she is a primary care giver)   1985- michael- lives in [place] 1 daughter   Enjoys reading   Caffeine- coffee, 4 cups daily, soda every other day   Social Drivers of Health   Financial Resource Strain: Low Risk  (08/17/2023)   Overall Financial Resource Strain (CARDIA)    Difficulty of Paying Living Expenses: Not very hard  Food Insecurity: No Food Insecurity (08/17/2023)   Hunger Vital Sign    Worried About Running Out of Food in the Last Year: Never true    Ran Out of Food in the Last Year: Never true  Transportation Needs: No Transportation Needs (08/17/2023)   PRAPARE - Administrator, Civil Service (Medical): No    Lack of Transportation (Non-Medical): No  Physical Activity: Insufficiently Active (08/17/2023)   Exercise Vital Sign    Days of Exercise per Week: 2 days    Minutes of Exercise per Session: 30 min  Stress: No Stress Concern Present (08/17/2023)   [employer] of Occupational Health - Occupational Stress Questionnaire    Feeling of Stress : Not at all  Social Connections: Moderately Isolated (08/17/2023)   Social Connection and Isolation Panel [NHANES]    Frequency of Communication with Friends and Family: More than three times a week    Frequency of Social Gatherings with Friends and Family: Once a week    Attends Religious Services: More than 4 times per year    Active Member of [employer]  or Organizations: No    Attends Engineer, structural: Not on file    Marital Status: Divorced  Intimate Partner Violence: Not on file    Outpatient Medications Prior to Visit  Medication Sig Dispense Refill   APPLE CIDER VINEGAR PO Take 1 capsule by mouth daily.     atorvastatin (LIPITOR) 80 MG tablet Take 1 tablet (80 mg total) by mouth at bedtime.  30 tablet 0   clopidogrel (PLAVIX) 75 MG tablet TAKE 1 TABLET BY MOUTH EVERY DAY 15 tablet 0   cyclobenzaprine (FLEXERIL) 5 MG tablet Take 1 tablet (5 mg total) by mouth 3 (three) times daily as needed for muscle spasms. 30 tablet 0   ezetimibe (ZETIA) 10 MG tablet Take 1 tablet (10 mg total) by mouth daily. 90 tablet 1   famotidine (PEPCID) 20 MG tablet TAKE 1 TABLET BY MOUTH EVERY DAY 15 tablet 0   gabapentin (NEURONTIN) 300 MG capsule Take 1 capsule (300 mg total) by mouth at bedtime. 90 capsule 1   mirabegron ER (MYRBETRIQ) 25 MG TB24 tablet Take 1 tablet (25 mg total) by mouth daily. 30 tablet 5   Multiple Vitamin (MULTIVITAMIN) tablet Take 1 tablet by mouth daily.     nitroGLYCERIN (NITROSTAT) 0.4 MG SL tablet Place 1 tablet (0.4 mg total) under the tongue every 5 (five) minutes as needed for chest pain. 12 tablet 0   methylPREDNISolone (MEDROL DOSEPAK) 4 MG TBPK tablet Take per package instructions. 21 tablet 0   Varenicline Tartrate, Starter, (CHANTIX STARTING MONTH PAK) 0.5 MG X 11 & 1 MG X 42 TBPK Take one 0.5 mg tablet by mouth once daily for 3 days, then increase to one 0.5 mg tablet twice daily for 4 days, then increase to one 1 mg tablet twice daily. 53 each 0   No facility-administered medications prior to visit.    No Known Allergies  ROS    See HPI Objective:    Physical Exam Constitutional:      General: She is not in acute distress.    Appearance: Normal appearance. She is well-developed.  HENT:     Head: Normocephalic and atraumatic.     Right Ear: External ear normal.     Left Ear: External ear normal.   Eyes:     General: No scleral icterus. Neck:     Thyroid: No thyromegaly.  Cardiovascular:     Rate and Rhythm: Normal rate and regular rhythm.     Heart sounds: Normal heart sounds. No murmur heard. Pulmonary:     Effort: Pulmonary effort is normal. No respiratory distress.     Breath sounds: Normal breath sounds. No wheezing.  Musculoskeletal:     Cervical back: Neck supple.  Skin:    General: Skin is warm and dry.  Neurological:     Mental Status: She is alert and oriented to person, place, and time.  Psychiatric:        Mood and Affect: Mood normal.        Behavior: Behavior normal.        Thought Content: Thought content normal.        Judgment: Judgment normal.      BP 139/69 (BP Location: Right Arm, Patient Position: Sitting, Cuff Size: Normal)   Pulse 94   Temp 98.6 F (37 C) (Oral)   Resp 16   Ht 5\' 5"  (1.651 m)   Wt 186 lb 9.6 oz (84.6 kg)   SpO2 100%   BMI 31.05 kg/m  Wt Readings from Last 3 Encounters:  11/28/23 186 lb 9.6 oz (84.6 kg)  09/19/23 184 lb (83.5 kg)  08/17/23 186 lb (84.4 kg)       Assessment & Plan:   Problem List Items Addressed This Visit       High   Tobacco abuse   Still working on quitting smoking.  Would like refill on chantix.  Relevant Medications   varenicline (CHANTIX) 0.5 MG tablet     Unprioritized   Lumbar radiculopathy - Primary   Chronic neuropathic pain, nocturnal, unresponsive to previous treatments, likely lumbar spine-related. - Prescribed tramadol at bedtime for pain and sleep - Advised ok to take acetaminophen with tramadol at bedtime if needed. - Controlled substance contract is signed today and urine drug screen will be obtained. - Instructed to contact provider for tramadol refill before depletion, no automatic refills.      Relevant Medications   traMADol (ULTRAM) 50 MG tablet   varenicline (CHANTIX) 0.5 MG tablet   Other Relevant Orders   DRUG MONITORING, PANEL 8 WITH CONFIRMATION, URINE     I have discontinued Jozlynn M. Piloto's Varenicline Tartrate (Starter) and methylPREDNISolone. I am also having her start on traMADol and varenicline. Additionally, I am having her maintain her multivitamin, APPLE CIDER VINEGAR PO, ezetimibe, gabapentin, atorvastatin, famotidine, nitroGLYCERIN, mirabegron ER, cyclobenzaprine, and clopidogrel.  Meds ordered this encounter  Medications   traMADol (ULTRAM) 50 MG tablet    Sig: Take 1 tablet (50 mg total) by mouth at bedtime as needed.    Dispense:  30 tablet    Refill:  0    Supervising Provider:   Danise Edge A [[id number]]   varenicline (CHANTIX) 0.5 MG tablet    Sig: Take 1 tablet (0.5 mg total) by mouth 2 (two) times daily.    Dispense:  60 tablet    Refill:  1    Supervising Provider:   Danise Edge A [[id number]]

## 2023-11-28 NOTE — Assessment & Plan Note (Signed)
 Chronic neuropathic pain, nocturnal, unresponsive to previous treatments, likely lumbar spine-related. - Prescribed tramadol at bedtime for pain and sleep - Advised ok to take acetaminophen with tramadol at bedtime if needed. - Controlled substance contract is signed today and urine drug screen will be obtained. - Instructed to contact provider for tramadol refill before depletion, no automatic refills.

## 2023-11-29 LAB — DRUG MONITORING, PANEL 8 WITH CONFIRMATION, URINE
6 Acetylmorphine: NEGATIVE ng/mL (ref <10)
Alcohol Metabolites: NEGATIVE ng/mL (ref <500)
Amphetamines: NEGATIVE ng/mL (ref <500)
Benzodiazepines: NEGATIVE ng/mL (ref <100)
Buprenorphine, Urine: NEGATIVE ng/mL (ref <5)
Cocaine Metabolite: NEGATIVE ng/mL (ref <150)
Creatinine: 82.4 mg/dL (ref >=20.0)
MDMA: NEGATIVE ng/mL (ref <500)
Marijuana Metabolite: NEGATIVE ng/mL (ref <20)
Opiates: NEGATIVE ng/mL (ref <100)
Oxidant: NEGATIVE ug/mL (ref <200)
Oxycodone: NEGATIVE ng/mL (ref <100)
pH: 7.3 (ref 4.5–9.0)

## 2023-11-29 LAB — DM TEMPLATE

## 2023-11-30 ENCOUNTER — Ambulatory Visit: Payer: BC Managed Care – PPO | Admitting: Physical Therapy

## 2023-11-30 ENCOUNTER — Encounter: Payer: Self-pay | Admitting: Physical Therapy

## 2023-11-30 DIAGNOSIS — R252 Cramp and spasm: Secondary | ICD-10-CM | POA: Diagnosis not present

## 2023-11-30 DIAGNOSIS — M5416 Radiculopathy, lumbar region: Secondary | ICD-10-CM | POA: Diagnosis not present

## 2023-11-30 DIAGNOSIS — G8929 Other chronic pain: Secondary | ICD-10-CM | POA: Diagnosis not present

## 2023-11-30 DIAGNOSIS — M5442 Lumbago with sciatica, left side: Secondary | ICD-10-CM | POA: Diagnosis not present

## 2023-11-30 NOTE — Therapy (Signed)
 OUTPATIENT PHYSICAL THERAPY THORACOLUMBAR TREATMENT   Patient Name: Jaime Harris MRN: [id number] DOB:05/01/1956, 68 y.o., female Today's Date: 11/30/2023  END OF SESSION:  PT End of Session - 11/30/23 0847     Visit Number 4    Date for PT Re-Evaluation 12/21/23    Authorization Type [employer]    PT Start Time [phone]    Activity Tolerance Patient tolerated treatment well    Behavior During Therapy [facility] for tasks assessed/performed              Past Medical History:  Diagnosis Date   Aneurysm (HCC) 2020   supraclinoid R int carotid artery   CAD (coronary artery disease)    LHC 01/03/2022: 30% of pRCA followed by 80% of mRCA s/p DES, 20% of pLAD, &30% of p-mLCX   Chronic tension headaches    Headache    Hyperlipidemia    borderline   Non-ST elevation (NSTEMI) myocardial infarction (HCC) 01/03/2022   Sickle cell trait (HCC)    Thyroid disease    enlarged thyroid   Vertigo    Vitamin D deficiency    Past Surgical History:  Procedure Laterality Date   COLONOSCOPY  2016   COLONOSCOPY WITH PROPOFOL  10/27/2022   CORONARY PRESSURE/FFR STUDY N/A 01/03/2022   Procedure: INTRAVASCULAR PRESSURE WIRE/FFR STUDY;  Surgeon: Kathleene Hazel, MD;  Location: [facility];  Service: Cardiovascular;  Laterality: N/A;   CORONARY STENT INTERVENTION N/A 01/03/2022   Procedure: CORONARY STENT INTERVENTION;  Surgeon: Kathleene Hazel, MD;  Location: [facility];  Service: Cardiovascular;  Laterality: N/A;   FOOT SURGERY Bilateral    Pt states she had bones removed from the sides of her feet and middle toes so she could walk better.   LEFT HEART CATH AND CORONARY ANGIOGRAPHY N/A 01/03/2022   Procedure: LEFT HEART CATH AND CORONARY ANGIOGRAPHY;  Surgeon: Kathleene Hazel, MD;  Location: [facility];  Service: Cardiovascular;  Laterality: N/A;   TONSILLECTOMY  09/11/1973   TUBAL LIGATION     Patient Active Problem List   Diagnosis Date Noted   Left  shoulder pain 09/19/2023   Soft tissue mass 09/19/2023   Excessive tear production of left lacrimal gland 08/17/2023   Whiplash injuries, initial encounter 02/27/2023   Constipation 02/13/2023   Bilateral foot pain 12/22/2022   Preventative health care 04/14/2022   Ceruminosis, bilateral 04/14/2022   CAD (coronary artery disease) 01/04/2022   Carotid aneurysm, right (HCC) 01/04/2022   Essential hypertension    Hyperlipidemia    History of non-ST elevation myocardial infarction (NSTEMI) 01/03/2022   Overactive bladder 04/12/2021   Lumbar radiculopathy 01/04/2021   Gastroesophageal reflux disease 10/13/2020   Other intervertebral disc degeneration, lumbar region 04/14/2020   Obstructive sleep apnea 11/04/2018   Vitamin D deficiency 12/30/2014   Migraine without status migrainosus, not intractable 12/25/2014   Tobacco abuse 12/25/2014   Carpal tunnel syndrome 12/25/2014   Thyromegaly 11/16/2014   IBS (irritable bowel syndrome) 11/16/2014    PCP: Sandford Craze, NP   REFERRING PROVIDER: Sandford Craze, NP   REFERRING DIAG: M54.16 (ICD-10-CM) - Lumbar radiculopathy   Rationale for Evaluation and Treatment: Rehabilitation  THERAPY DIAG:  Chronic left-sided low back pain with left-sided sciatica  Radiculopathy, lumbar region  Cramp and spasm  ONSET DATE: 1 month ago  SUBJECTIVE:  SUBJECTIVE STATEMENT: Good days and bad days.  Not to bad right now, just in low back.  Still mostly bothers her when she lays down.  Her PCP changed her meds for night and she did sleep last few nights.    PERTINENT HISTORY:  Smoker, CAD, history NSTEMI  PAIN:  Are you having pain? Yes: NPRS scale: 3/10 Pain location: low back  Pain description: ache, just hurts Aggravating factors: lying down after 5  min  Relieving factors: getting up  PRECAUTIONS: None  RED FLAGS: None   WEIGHT BEARING RESTRICTIONS: No  FALLS:  Has patient fallen in last 6 months? No  LIVING ENVIRONMENT: Lives with: lives alone Lives in: House/apartment Stairs: No Has following equipment at home: None  OCCUPATION: making eyeglasses, third shift, sitting primarily   PLOF: Independent  PATIENT GOALS: "I want to be able to sleep"  NEXT MD VISIT: 12/18/23 f/u with PCP  OBJECTIVE:   DIAGNOSTIC FINDINGS:  05/25/2023 MRI Lumbar Spine IMPRESSION: 1. Mild lumbar spine spondylosis as described above. 2. No acute osseous injury of the lumbar spine.  PATIENT SURVEYS:  Modified Oswestry 11/50   COGNITION: Overall cognitive status: Within functional limits for tasks assessed     SENSATION: WFL  MUSCLE LENGTH: Hamstrings: Right 90 deg; Left ~70 deg Prone knee bend: Right 90 deg; Left ~60 deg  POSTURE: No Significant postural limitations  PALPATION: Tenderness L lumbar paraspinals, increased edema bilateral ankles (R ankle 22 cm, L ankle 23 cm non-pitting)   LUMBAR ROM:   AROM eval  Flexion To shins, pain with return left side low back  Extension WNL, pressure low back  Right lateral flexion To knee, pulls on L  Left lateral flexion To knee   Right rotation Catches on Le  Left rotation WNL   (Blank rows = not tested)  LOWER EXTREMITY ROM:      LOWER EXTREMITY MMT:    MMT Right eval Left eval  Hip flexion 5* 4+*p! In back  Hip extension    Hip abduction 5* 5*  Hip adduction 4+* 4+* p! In back  Knee flexion 5 4+ p! In back  Knee extension 5 5 p! In back  Ankle dorsiflexion 5 5  Ankle plantarflexion 5 5   (Blank rows = not tested) * tested in sitting   Passive Accessory Intervertebral Motion (PAIVM) Pt confirmsreproduction of back pain with CPA L4-L5 and L UPA L4-L5 . Generally hypomobile throughout    SPECIAL TESTS Lumbar Radiculopathy and Discogenic: Centralization and  Peripheralization (SN 92, -LR 0.12): Not examined SLR (SN 92, -LR 0.29): R: Negative L:  Positive Crossed SLR (SP 90): R: Negative L: Negative   Facet Joint: Extension-Rotation (SN 100, -LR 0.0): R: Positive L: Positive  Hip: FABER (SN 81): R: Negative L: Positive FADIR (SN 94): R: Negative L: Negative Hip scour (SN 50): R: Negative L: Negative   SIJ:  Thigh Thrust (SN 88, -LR 0.18) : R: Negative L: Positive   Functional Tasks Sit to stand: 5x sit to stand 15.4 seconds   GAIT: Distance walked: 66' Assistive device utilized: None Level of assistance: Complete Independence Comments: decreased gait speed but no significant deviations   TODAY'S TREATMENT:  DATE:   11/30/23 Therapeutic Exercise: to improve strength and mobility.  Demo, verbal and tactile cues throughout for technique. Nustep L5 x 6 min -review standing McKenzie exercises LTR x 10  Pelvic tilts x 15 Bridges 2 x 10 - cues for breathing and TrA engagement Prone press-ups x 10 - with overpressure from PT Manual Therapy: to decrease muscle spasm and pain and improve mobility L UPA mobs to lumbar spine grade 3-4, STM/TPR to lumbar paraspinals, manual lumbar traction.  Modalities: MHP to low back x 10 min (not included in treatment time.     11/19/23 Therapeutic Exercise: to improve strength and mobility.  Demo, verbal and tactile cues throughout for technique.  Nustep L5 x 6 min  Prone leg extensions x 10 R/L Supine bridges x 10  Ant/Post pelvic tilts x 10  NEUROMUSCULAR RE-EDUCATION: to relieve radicular symptoms L side glides x 10 Lumbar extensions at wall x 10  Prone press-ups x 10  Prone press-up with overpressure on L Lumbar spine x 10 Prone knee flexion x 10 B Dynamic Sciatic nerve glide supine and sitting  Manual Therapy: to decrease muscle spasm and pain and improve  mobility L UPA mobs to L lumbar spine Manual lumbar traction with legs over ball    11/13/23 Therapeutic Exercise: to improve strength and mobility.  Demo, verbal and tactile cues throughout for technique.  Nustep L3x43min UE/LE Supine LTR x 10 both ways Supine TrA bracing 10x3" Bridges with TrA  x 10 Supine hip ER/IR x 10 both ways  NEUROMUSCULAR RE-EDUCATION: to relieve radicular symptoms Prone on elbows 1 min hold 2x Prone knee flexion x 10 B Prone press up x 8 Manual Sciatic nerve glide supine  L long leg distraction  11/09/23 EVAL Self Care: Education on sleep positions to try to decrease nerve compression irritation.  Handout provided.  Also counseled on smoking cessation.  Neuromuscular Reeducation: to decrease nerve irritability and centralize symptoms.  Prone lying Prone knee bends Side glides - left side to wall - tolerated without pain Lumbar extension - demo today    PATIENT EDUCATION:  Education details: HEP review, education on TrDN Person educated: Patient Education method: Programmer, multimedia, Demonstration, Verbal cues, and Handouts Education comprehension: verbalized understanding and returned demonstration  HOME EXERCISE PROGRAM: Access Code: [email or web address] URL: [email or web address] Date: 11/19/2023 Prepared by: Harrie Foreman  Exercises - Lying Prone  - 1 x daily - 7 x weekly - 3 sets - 10 reps - Prone Knee Flexion  - 1 x daily - 7 x weekly - 1 sets - 10 reps - Left Standing Lateral Shift Correction at Wall - Repetitions  - 3 x daily - 7 x weekly - 1 sets - 10 reps - Standing Lumbar Extension at Wall - Forearms  - 3 x daily - 7 x weekly - 1 sets - 10 reps - Prone Press Up On Elbows  - 1 x daily - 7 x weekly - 2 sets - 10 reps - 1 min hold hold - Prone Off Center Lumbar Extension Press Up  - 1 x daily - 7 x weekly - 2 sets - 10 reps - Standing Lumbar Extension  - 10 x daily - 7 x weekly - 1 sets - 10 reps - Supine Lower Trunk Rotation  - 1 x daily  - 7 x weekly - 1 sets - 10 reps - Supine Posterior Pelvic Tilt  - 1 x daily - 7 x weekly - 1 sets - 10 reps - Supine Bridge  -  1 x daily - 7 x weekly - 1 sets - 10 reps - Supine 90/90 Sciatic Nerve Glide with Knee Flexion/Extension  - 1 x daily - 7 x weekly - 3 sets - 10 reps  Patient Education - Sleep Positions  ASSESSMENT:  CLINICAL IMPRESSION: Jaime Harris reports still having pain primarily at night, but improved now with new medications.  Doing McKenzie exercises frequently throughout the day.  Still demonstrating weakness in glutes, especially with bridges.  Tenderness still with L UPA mobs.  Discussed TrDN including risks, side effects and benefits and gave her handout, she will think about it for next session.  Applied MHP at end of session.   Overall good tolerance of today's interventions, Jaime Harris continues to demonstrate potential for improvement and would benefit from continued skilled therapy to address impairments.     OBJECTIVE IMPAIRMENTS: decreased activity tolerance, decreased ROM, decreased strength, hypomobility, increased edema, increased fascial restrictions, impaired perceived functional ability, increased muscle spasms, and pain.   ACTIVITY LIMITATIONS: lifting, bending, sitting, sleeping, and transfers  PARTICIPATION LIMITATIONS: occupation  PERSONAL FACTORS: Time since onset of injury/illness/exacerbation and 3+ comorbidities: Smoker, CAD, history NSTEMI, chronic LBP  are also affecting patient's functional outcome.   REHAB POTENTIAL: Good  CLINICAL DECISION MAKING: Evolving/moderate complexity  EVALUATION COMPLEXITY: Moderate   GOALS: Goals reviewed with patient? Yes  SHORT TERM GOALS: Target date: 11/23/2023   Patient will be independent with initial HEP.  Baseline: given Goal status: MET 11/19/23  2.   Patient will report centralization of radicular symptoms.  Baseline: down to L ankle Goal status: IN PROGRESS  LONG TERM GOALS: Target  date: 12/21/2023   Patient will be independent with advanced/ongoing HEP to improve outcomes and carryover.  Baseline:  Goal status: IN PROGRESS  2.  Patient will report 75% improvement in low back/LLE  pain to improve QOL.  Baseline:  Goal status: IN PROGRESS  3.  Patient will demonstrate full pain free lumbar ROM to perform ADLs.   Baseline: see objective Goal status: IN PROGRESS  4. Patient will report being able to sleep without interruption from LLE pain.  Baseline: LLE pain wakes after laying down for 5 min  Goal status: IN PROGRESS  5.  Patient will report at least 6 points improvement on modified Oswestry to demonstrate improved functional ability.  Baseline: 11/50 Goal status: IN PROGRESS      PLAN:  PT FREQUENCY: 1-2x/week  PT DURATION: 6 weeks  PLANNED INTERVENTIONS: 97110-Therapeutic exercises, 97530- Therapeutic activity, 97112- Neuromuscular re-education, 97535- Self Care, [id number]- Manual therapy, [phone]- Gait training, [phone]- Orthotic Fit/training, [phone]- Electrical stimulation (unattended), [phone]- Electrical stimulation (manual), [id number]- Ultrasound, [id number]- Traction (mechanical), Patient/Family education, Balance training, Stair training, Taping, Dry Needling, Joint mobilization, Joint manipulation, Spinal manipulation, Spinal mobilization, Cryotherapy, and Moist heat.  PLAN FOR NEXT SESSION: progress McKenzie extension exercises as tolerated, manual therapy, modalities PRN.  TrDN?   Jena Gauss, PT 11/30/2023, 8:47 AM

## 2023-11-30 NOTE — Patient Instructions (Signed)

## 2023-12-07 ENCOUNTER — Ambulatory Visit: Payer: BC Managed Care – PPO | Admitting: Physical Therapy

## 2023-12-07 ENCOUNTER — Encounter: Payer: Self-pay | Admitting: Physical Therapy

## 2023-12-07 DIAGNOSIS — G8929 Other chronic pain: Secondary | ICD-10-CM | POA: Diagnosis not present

## 2023-12-07 DIAGNOSIS — R252 Cramp and spasm: Secondary | ICD-10-CM | POA: Diagnosis not present

## 2023-12-07 DIAGNOSIS — M5416 Radiculopathy, lumbar region: Secondary | ICD-10-CM

## 2023-12-07 DIAGNOSIS — M5442 Lumbago with sciatica, left side: Secondary | ICD-10-CM | POA: Diagnosis not present

## 2023-12-07 NOTE — Therapy (Signed)
 OUTPATIENT PHYSICAL THERAPY THORACOLUMBAR TREATMENT   Patient Name: Jaime Harris MRN: [id number] DOB:August 28, 1956, 68 y.o., female Today's Date: 12/07/2023  END OF SESSION:  PT End of Session - 12/07/23 0848     Visit Number 5    Date for PT Re-Evaluation 12/21/23    Authorization Type [employer]    PT Start Time 0848    PT Stop Time 0930    PT Time Calculation (min) 42 min    Activity Tolerance Patient tolerated treatment well    Behavior During Therapy [facility] for tasks assessed/performed              Past Medical History:  Diagnosis Date   Aneurysm (HCC) 2020   supraclinoid R int carotid artery   CAD (coronary artery disease)    LHC 01/03/2022: 30% of pRCA followed by 80% of mRCA s/p DES, 20% of pLAD, &30% of p-mLCX   Chronic tension headaches    Headache    Hyperlipidemia    borderline   Non-ST elevation (NSTEMI) myocardial infarction (HCC) 01/03/2022   Sickle cell trait (HCC)    Thyroid disease    enlarged thyroid   Vertigo    Vitamin D deficiency    Past Surgical History:  Procedure Laterality Date   COLONOSCOPY  2016   COLONOSCOPY WITH PROPOFOL  10/27/2022   CORONARY PRESSURE/FFR STUDY N/A 01/03/2022   Procedure: INTRAVASCULAR PRESSURE WIRE/FFR STUDY;  Surgeon: Kathleene Hazel, MD;  Location: [facility];  Service: Cardiovascular;  Laterality: N/A;   CORONARY STENT INTERVENTION N/A 01/03/2022   Procedure: CORONARY STENT INTERVENTION;  Surgeon: Kathleene Hazel, MD;  Location: [facility];  Service: Cardiovascular;  Laterality: N/A;   FOOT SURGERY Bilateral    Pt states she had bones removed from the sides of her feet and middle toes so she could walk better.   LEFT HEART CATH AND CORONARY ANGIOGRAPHY N/A 01/03/2022   Procedure: LEFT HEART CATH AND CORONARY ANGIOGRAPHY;  Surgeon: Kathleene Hazel, MD;  Location: [facility];  Service: Cardiovascular;  Laterality: N/A;   TONSILLECTOMY  09/11/1973   TUBAL LIGATION      Patient Active Problem List   Diagnosis Date Noted   Left shoulder pain 09/19/2023   Soft tissue mass 09/19/2023   Excessive tear production of left lacrimal gland 08/17/2023   Whiplash injuries, initial encounter 02/27/2023   Constipation 02/13/2023   Bilateral foot pain 12/22/2022   Preventative health care 04/14/2022   Ceruminosis, bilateral 04/14/2022   CAD (coronary artery disease) 01/04/2022   Carotid aneurysm, right (HCC) 01/04/2022   Essential hypertension    Hyperlipidemia    History of non-ST elevation myocardial infarction (NSTEMI) 01/03/2022   Overactive bladder 04/12/2021   Lumbar radiculopathy 01/04/2021   Gastroesophageal reflux disease 10/13/2020   Other intervertebral disc degeneration, lumbar region 04/14/2020   Obstructive sleep apnea 11/04/2018   Vitamin D deficiency 12/30/2014   Migraine without status migrainosus, not intractable 12/25/2014   Tobacco abuse 12/25/2014   Carpal tunnel syndrome 12/25/2014   Thyromegaly 11/16/2014   IBS (irritable bowel syndrome) 11/16/2014    PCP: Sandford Craze, NP   REFERRING PROVIDER: Sandford Craze, NP   REFERRING DIAG: M54.16 (ICD-10-CM) - Lumbar radiculopathy   Rationale for Evaluation and Treatment: Rehabilitation  THERAPY DIAG:  Chronic left-sided low back pain with left-sided sciatica  Radiculopathy, lumbar region  Cramp and spasm  ONSET DATE: 1 month ago  SUBJECTIVE:  SUBJECTIVE STATEMENT: Able to sleep now if she takes the tramadol, when she wakes up the leg hurts.  Mild now.   PERTINENT HISTORY:  Smoker, CAD, history NSTEMI  PAIN:  Are you having pain? Yes: NPRS scale: 2/10 Pain location: low back  Pain description: ache, just hurts Aggravating factors: lying down after 5 min  Relieving factors:  getting up  PRECAUTIONS: None  RED FLAGS: None   WEIGHT BEARING RESTRICTIONS: No  FALLS:  Has patient fallen in last 6 months? No  LIVING ENVIRONMENT: Lives with: lives alone Lives in: House/apartment Stairs: No Has following equipment at home: None  OCCUPATION: making eyeglasses, third shift, sitting primarily   PLOF: Independent  PATIENT GOALS: "I want to be able to sleep"  NEXT MD VISIT: 12/18/23 f/u with PCP  OBJECTIVE:   DIAGNOSTIC FINDINGS:  05/25/2023 MRI Lumbar Spine IMPRESSION: 1. Mild lumbar spine spondylosis as described above. 2. No acute osseous injury of the lumbar spine.  PATIENT SURVEYS:  Modified Oswestry 11/50   COGNITION: Overall cognitive status: Within functional limits for tasks assessed     SENSATION: WFL  MUSCLE LENGTH: Hamstrings: Right 90 deg; Left ~70 deg Prone knee bend: Right 90 deg; Left ~60 deg  POSTURE: No Significant postural limitations  PALPATION: Tenderness L lumbar paraspinals, increased edema bilateral ankles (R ankle 22 cm, L ankle 23 cm non-pitting)   LUMBAR ROM:   AROM eval  Flexion To shins, pain with return left side low back  Extension WNL, pressure low back  Right lateral flexion To knee, pulls on L  Left lateral flexion To knee   Right rotation Catches on Le  Left rotation WNL   (Blank rows = not tested)  LOWER EXTREMITY ROM:      LOWER EXTREMITY MMT:    MMT Right eval Left eval  Hip flexion 5* 4+*p! In back  Hip extension    Hip abduction 5* 5*  Hip adduction 4+* 4+* p! In back  Knee flexion 5 4+ p! In back  Knee extension 5 5 p! In back  Ankle dorsiflexion 5 5  Ankle plantarflexion 5 5   (Blank rows = not tested) * tested in sitting   Passive Accessory Intervertebral Motion (PAIVM) Pt confirmsreproduction of back pain with CPA L4-L5 and L UPA L4-L5 . Generally hypomobile throughout    SPECIAL TESTS Lumbar Radiculopathy and Discogenic: Centralization and Peripheralization (SN 92, -LR  0.12): Not examined SLR (SN 92, -LR 0.29): R: Negative L:  Positive Crossed SLR (SP 90): R: Negative L: Negative   Facet Joint: Extension-Rotation (SN 100, -LR 0.0): R: Positive L: Positive  Hip: FABER (SN 81): R: Negative L: Positive FADIR (SN 94): R: Negative L: Negative Hip scour (SN 50): R: Negative L: Negative   SIJ:  Thigh Thrust (SN 88, -LR 0.18) : R: Negative L: Positive   Functional Tasks Sit to stand: 5x sit to stand 15.4 seconds   GAIT: Distance walked: 60' Assistive device utilized: None Level of assistance: Complete Independence Comments: decreased gait speed but no significant deviations   TODAY'S TREATMENT:  DATE:   12/07/23 Therapeutic Exercise: to improve strength and mobility.  Demo, verbal and tactile cues throughout for technique. Bike L1 x 6 min  Standing lumbar extension - irritating, discontinued Prone leg extensions x 10 bil Supine piriformis stretch PPT x 10  Bridges x 10  SLR x 10  Clamshell 2 x 10 each side.  Manual Therapy: to decrease muscle spasm and pain and improve mobility STM/TPR to L glutes, piriformis, lumbar paraspinals, L UPA mobs to lumbar spine, skilled palpation and monitoring during dry needling. Trigger Point Dry Needling  Initial Treatment: Pt instructed on Dry Needling rational, procedures, and possible side effects. Pt instructed to expect mild to moderate muscle soreness later in the day and/or into the next day.  Pt instructed in methods to reduce muscle soreness. Pt instructed to continue prescribed HEP. Patient was educated on signs and symptoms of infection and other risk factors and advised to seek medical attention should they occur.  Patient verbalized understanding of these instructions and education.   Patient Verbal Consent Given: Yes Education Handout Provided: Yes Muscles Treated: L  lumbar multifidi L5, L glut max, L piriformis Electrical Stimulation Performed: No Treatment Response/Outcome: Twitch Response Elicited and Palpable Increase in Muscle Length   11/30/23 Therapeutic Exercise: to improve strength and mobility.  Demo, verbal and tactile cues throughout for technique. Nustep L5 x 6 min -review standing McKenzie exercises LTR x 10  Pelvic tilts x 15 Bridges 2 x 10 - cues for breathing and TrA engagement Prone press-ups x 10 - with overpressure from PT Manual Therapy: to decrease muscle spasm and pain and improve mobility L UPA mobs to lumbar spine grade 3-4, STM/TPR to lumbar paraspinals, manual lumbar traction.  Modalities: MHP to low back x 10 min (not included in treatment time.     11/19/23 Therapeutic Exercise: to improve strength and mobility.  Demo, verbal and tactile cues throughout for technique.  Nustep L5 x 6 min  Prone leg extensions x 10 R/L Supine bridges x 10  Ant/Post pelvic tilts x 10  NEUROMUSCULAR RE-EDUCATION: to relieve radicular symptoms L side glides x 10 Lumbar extensions at wall x 10  Prone press-ups x 10  Prone press-up with overpressure on L Lumbar spine x 10 Prone knee flexion x 10 B Dynamic Sciatic nerve glide supine and sitting  Manual Therapy: to decrease muscle spasm and pain and improve mobility L UPA mobs to L lumbar spine Manual lumbar traction with legs over ball     PATIENT EDUCATION:  Education details: review TrDN, HEP update Person educated: Patient Education method: Explanation, Demonstration, Verbal cues, and Handouts Education comprehension: verbalized understanding and returned demonstration  HOME EXERCISE PROGRAM: Access Code: [email or web address] URL: [email or web address] Date: 11/19/2023 Prepared by: Harrie Foreman  Exercises - Lying Prone  - 1 x daily - 7 x weekly - 3 sets - 10 reps - Prone Knee Flexion  - 1 x daily - 7 x weekly - 1 sets - 10 reps - Left Standing Lateral Shift  Correction at Wall - Repetitions  - 3 x daily - 7 x weekly - 1 sets - 10 reps - Standing Lumbar Extension at Wall - Forearms  - 3 x daily - 7 x weekly - 1 sets - 10 reps - Prone Press Up On Elbows  - 1 x daily - 7 x weekly - 2 sets - 10 reps - 1 min hold hold - Prone Off Center Lumbar Extension Press Up  - 1 x daily - 7 x  weekly - 2 sets - 10 reps - Standing Lumbar Extension  - 10 x daily - 7 x weekly - 1 sets - 10 reps - Supine Lower Trunk Rotation  - 1 x daily - 7 x weekly - 1 sets - 10 reps - Supine Posterior Pelvic Tilt  - 1 x daily - 7 x weekly - 1 sets - 10 reps - Supine Bridge  - 1 x daily - 7 x weekly - 1 sets - 10 reps - Supine 90/90 Sciatic Nerve Glide with Knee Flexion/Extension  - 1 x daily - 7 x weekly - 3 sets - 10 reps  Patient Education - Sleep Positions  ASSESSMENT:  CLINICAL IMPRESSION: LAELIA ANGELO continues to report improved sleep with medication, and mild low back pain during the day.  After explanation of DN rational, procedures, outcomes and potential side effects, patient verbalized consent to DN treatment in conjunction with manual STM/DTM and TPR to reduce ttp/muscle tension. Muscles treated as indicated above. DN produced normal response with good twitches elicited resulting in palpable reduction in pain/ttp and muscle tension, with patient noting less pain upon initiation of movement following DN. Pt educated to expect mild to moderate muscle soreness for up to 24-48 hrs and instructed to continue prescribed home exercise program and current activity level with pt verbalizing understanding of theses instructions.  After manual therapy then performed exercises to activate deep core and help stretch glutes to prevent soreness.  HEP updated. JALAYNA JOSTEN continues to demonstrate potential for improvement and would benefit from continued skilled therapy to address impairments.     OBJECTIVE IMPAIRMENTS: decreased activity tolerance, decreased ROM, decreased  strength, hypomobility, increased edema, increased fascial restrictions, impaired perceived functional ability, increased muscle spasms, and pain.   ACTIVITY LIMITATIONS: lifting, bending, sitting, sleeping, and transfers  PARTICIPATION LIMITATIONS: occupation  PERSONAL FACTORS: Time since onset of injury/illness/exacerbation and 3+ comorbidities: Smoker, CAD, history NSTEMI, chronic LBP  are also affecting patient's functional outcome.   REHAB POTENTIAL: Good  CLINICAL DECISION MAKING: Evolving/moderate complexity  EVALUATION COMPLEXITY: Moderate   GOALS: Goals reviewed with patient? Yes  SHORT TERM GOALS: Target date: 11/23/2023   Patient will be independent with initial HEP.  Baseline: given Goal status: MET 11/19/23  2.   Patient will report centralization of radicular symptoms.  Baseline: down to L ankle Goal status: IN PROGRESS  LONG TERM GOALS: Target date: 12/21/2023   Patient will be independent with advanced/ongoing HEP to improve outcomes and carryover.  Baseline:  Goal status: IN PROGRESS  2.  Patient will report 75% improvement in low back/LLE  pain to improve QOL.  Baseline:  Goal status: IN PROGRESS  3.  Patient will demonstrate full pain free lumbar ROM to perform ADLs.   Baseline: see objective Goal status: IN PROGRESS  4. Patient will report being able to sleep without interruption from LLE pain.  Baseline: LLE pain wakes after laying down for 5 min  Goal status: IN PROGRESS  5.  Patient will report at least 6 points improvement on modified Oswestry to demonstrate improved functional ability.  Baseline: 11/50 Goal status: IN PROGRESS      PLAN:  PT FREQUENCY: 1-2x/week  PT DURATION: 6 weeks  PLANNED INTERVENTIONS: 97110-Therapeutic exercises, 97530- Therapeutic activity, 97112- Neuromuscular re-education, [phone]- Self Care, 40102- Manual therapy, [phone]- Gait training, [phone]- Orthotic Fit/training, [phone]- Electrical stimulation (unattended),  [phone]- Electrical stimulation (manual), [id number]- Ultrasound, 75643- Traction (mechanical), Patient/Family education, Balance training, Stair training, Taping, Dry Needling, Joint mobilization, Joint  manipulation, Spinal manipulation, Spinal mobilization, Cryotherapy, and Moist heat.  PLAN FOR NEXT SESSION: Assess response to TrDN.  progress McKenzie extension exercises as tolerated, manual therapy, modalities PRN.    Jena Gauss, PT 12/07/2023, 9:35 AM

## 2023-12-07 NOTE — Patient Instructions (Signed)

## 2023-12-18 ENCOUNTER — Ambulatory Visit (INDEPENDENT_AMBULATORY_CARE_PROVIDER_SITE_OTHER): Payer: BC Managed Care – PPO | Admitting: Family

## 2023-12-18 ENCOUNTER — Telehealth: Payer: Self-pay | Admitting: Family

## 2023-12-18 VITALS — BP 135/80 | HR 100 | Temp 97.6°F | Ht 65.0 in | Wt 186.0 lb

## 2023-12-18 DIAGNOSIS — I720 Aneurysm of carotid artery: Secondary | ICD-10-CM

## 2023-12-18 DIAGNOSIS — M5416 Radiculopathy, lumbar region: Secondary | ICD-10-CM | POA: Diagnosis not present

## 2023-12-18 DIAGNOSIS — I2510 Atherosclerotic heart disease of native coronary artery without angina pectoris: Secondary | ICD-10-CM

## 2023-12-18 DIAGNOSIS — Z720 Tobacco use: Secondary | ICD-10-CM | POA: Diagnosis not present

## 2023-12-18 DIAGNOSIS — E785 Hyperlipidemia, unspecified: Secondary | ICD-10-CM

## 2023-12-18 DIAGNOSIS — I10 Essential (primary) hypertension: Secondary | ICD-10-CM | POA: Diagnosis not present

## 2023-12-18 MED ORDER — CLOPIDOGREL BISULFATE 75 MG PO TABS: 75.0000 mg | ORAL_TABLET | Freq: Every day | ORAL | 0 refills | Status: DC

## 2023-12-18 MED ORDER — ATORVASTATIN CALCIUM 80 MG PO TABS: 80.0000 mg | ORAL_TABLET | Freq: Every day | ORAL | 0 refills | Status: DC

## 2023-12-18 MED ORDER — EZETIMIBE 10 MG PO TABS: 10.0000 mg | ORAL_TABLET | Freq: Every day | ORAL | 0 refills | Status: DC

## 2023-12-18 NOTE — Assessment & Plan Note (Signed)
 Refills sent on zetia/lipitor. Pt to schedule follow up with cardiology.

## 2023-12-18 NOTE — Assessment & Plan Note (Signed)
 Last CT 5/23- will repeat. Previously has seen Dr. Danae Orleans.

## 2023-12-18 NOTE — Assessment & Plan Note (Addendum)
 BP Readings from Last 3 Encounters:  12/18/23 (!) 148/57  11/28/23 139/69  09/19/23 132/72   Not currently on bp medication. Initial BP elevated but repeat BP OK.

## 2023-12-18 NOTE — Assessment & Plan Note (Addendum)
 She is taking chantix,  smoking 6-7 a day.  Mood is stable, denies weird dreams on chantix.  Encouraged complete cessation.

## 2023-12-18 NOTE — Assessment & Plan Note (Signed)
 Has not been seen by cardiology since 2023. Looks like she is out of plavix, zetia, atorvastatin. 30 day refills sent, advised follow up- see mychart.

## 2023-12-18 NOTE — Patient Instructions (Signed)
 Restart plavix, lipitor and zetia. Work on completely quitting smoking. Schedule a follow up visit with cardiology.

## 2023-12-18 NOTE — Progress Notes (Signed)
 Subjective:     Patient ID: Jaime Harris, female    DOB: March 24, 1956, 68 y.o.   MRN: [id number]  Chief Complaint  Patient presents with   Hypertension    Here for follow up    Hypertension    Discussed the use of AI scribe software for clinical note transcription with the patient, who gave verbal consent to proceed.  History of Present Illness   Patient is a 68 yr old female who presents today for follow up.  She reports that she has cut back to 6-7 cigarettes most days since she started chantix. Mood is stable, not having any nightmares.  Pain management- last visit we added tramadol HS.  She is sleeping much  better and pain is better controlled.    [facility]- did not pick up myrbetiq due to high deductible.  She is going to retry to see if she has come closer to meeting her rx deductible.     Health Maintenance Due  Topic Date Due   Zoster Vaccines- Shingrix (1 of 2) Never done   COVID-19 Vaccine (2 - 2024-25 season) 05/13/2023    Past Medical History:  Diagnosis Date   Aneurysm (HCC) 2020   supraclinoid R int carotid artery   CAD (coronary artery disease)    LHC 01/03/2022: 30% of pRCA followed by 80% of mRCA s/p DES, 20% of pLAD, &30% of p-mLCX   Chronic tension headaches    Headache    Hyperlipidemia    borderline   Non-ST elevation (NSTEMI) myocardial infarction (HCC) 01/03/2022   Sickle cell trait (HCC)    Thyroid disease    enlarged thyroid   Vertigo    Vitamin D deficiency     Past Surgical History:  Procedure Laterality Date   COLONOSCOPY  2016   COLONOSCOPY WITH PROPOFOL  10/27/2022   CORONARY PRESSURE/FFR STUDY N/A 01/03/2022   Procedure: INTRAVASCULAR PRESSURE WIRE/FFR STUDY;  Surgeon: Kathleene Hazel, MD;  Location: [facility];  Service: Cardiovascular;  Laterality: N/A;   CORONARY STENT INTERVENTION N/A 01/03/2022   Procedure: CORONARY STENT INTERVENTION;  Surgeon: Kathleene Hazel, MD;  Location: [facility];   Service: Cardiovascular;  Laterality: N/A;   FOOT SURGERY Bilateral    Pt states she had bones removed from the sides of her feet and middle toes so she could walk better.   LEFT HEART CATH AND CORONARY ANGIOGRAPHY N/A 01/03/2022   Procedure: LEFT HEART CATH AND CORONARY ANGIOGRAPHY;  Surgeon: Kathleene Hazel, MD;  Location: [facility];  Service: Cardiovascular;  Laterality: N/A;   TONSILLECTOMY  09/11/1973   TUBAL LIGATION      Family History  Problem Relation Age of Onset   Heart disease Mother        pacemaker/defibrillator   Diabetes Mother    Hypertension Sister    Diabetes Brother    Heart disease Paternal Grandmother        died of CHF   Diabetes Paternal Grandmother    Cancer Neg Hx    Kidney disease Neg Hx    Colon cancer Neg Hx    Colon polyps Neg Hx    Rectal cancer Neg Hx    Stomach cancer Neg Hx    Esophageal cancer Neg Hx     Social History   Socioeconomic History   Marital status: Single    Spouse name: Not on file   Number of children: 3   Years of education: Not on file  Highest education level: GED or equivalent  Occupational History    Comment: Cashier  Tobacco Use   Smoking status: Every Day    Current packs/day: 0.50    Types: Cigarettes    Passive exposure: Never   Smokeless tobacco: Never  Vaping Use   Vaping status: Never Used  Substance and Sexual Activity   Alcohol use: Yes    Comment: occasional   Drug use: No   Sexual activity: Not Currently    Partners: Male    Birth control/protection: Post-menopausal  Other Topics Concern   Not on file  Social History Narrative   Makes eye glasses.     Single   GED   She has 3 children   69- Son Jaime Harris- lives locally- he has one son   12- Daughter Jaime Harris- 7 children (4 of these children live with the pt- she is a primary care giver)   1985- Jaime Harris- lives in [place] 1 daughter   Enjoys reading   Caffeine- coffee, 4 cups daily, soda every other day   Social Drivers  of Health   Financial Resource Strain: Low Risk  (08/17/2023)   Overall Financial Resource Strain (CARDIA)    Difficulty of Paying Living Expenses: Not very hard  Food Insecurity: No Food Insecurity (08/17/2023)   Hunger Vital Sign    Worried About Running Out of Food in the Last Year: Never true    Ran Out of Food in the Last Year: Never true  Transportation Needs: No Transportation Needs (08/17/2023)   PRAPARE - Administrator, Civil Service (Medical): No    Lack of Transportation (Non-Medical): No  Physical Activity: Insufficiently Active (08/17/2023)   Exercise Vital Sign    Days of Exercise per Week: 2 days    Minutes of Exercise per Session: 30 min  Stress: No Stress Concern Present (08/17/2023)   [employer] of Occupational Health - Occupational Stress Questionnaire    Feeling of Stress : Not at all  Social Connections: Moderately Isolated (08/17/2023)   Social Connection and Isolation Panel [NHANES]    Frequency of Communication with Friends and Family: More than three times a week    Frequency of Social Gatherings with Friends and Family: Once a week    Attends Religious Services: More than 4 times per year    Active Member of [employer] or Organizations: No    Attends Engineer, structural: Not on file    Marital Status: Divorced  Intimate Partner Violence: Not on file    Outpatient Medications Prior to Visit  Medication Sig Dispense Refill   APPLE CIDER VINEGAR PO Take 1 capsule by mouth daily.     cyclobenzaprine (FLEXERIL) 5 MG tablet Take 1 tablet (5 mg total) by mouth 3 (three) times daily as needed for muscle spasms. 30 tablet 0   famotidine (PEPCID) 20 MG tablet TAKE 1 TABLET BY MOUTH EVERY DAY 15 tablet 0   gabapentin (NEURONTIN) 300 MG capsule Take 1 capsule (300 mg total) by mouth at bedtime. 90 capsule 1   mirabegron ER (MYRBETRIQ) 25 MG TB24 tablet Take 1 tablet (25 mg total) by mouth daily. 30 tablet 5   Multiple Vitamin (MULTIVITAMIN) tablet  Take 1 tablet by mouth daily.     nitroGLYCERIN (NITROSTAT) 0.4 MG SL tablet Place 1 tablet (0.4 mg total) under the tongue every 5 (five) minutes as needed for chest pain. 12 tablet 0   traMADol (ULTRAM) 50 MG tablet Take 1 tablet (50 mg total)  by mouth at bedtime as needed. 30 tablet 0   varenicline (CHANTIX) 0.5 MG tablet Take 1 tablet (0.5 mg total) by mouth 2 (two) times daily. 60 tablet 1   atorvastatin (LIPITOR) 80 MG tablet Take 1 tablet (80 mg total) by mouth at bedtime. 30 tablet 0   clopidogrel (PLAVIX) 75 MG tablet TAKE 1 TABLET BY MOUTH EVERY DAY 15 tablet 0   ezetimibe (ZETIA) 10 MG tablet Take 1 tablet (10 mg total) by mouth daily. 90 tablet 1   No facility-administered medications prior to visit.    No Known Allergies  ROS See HPI    Objective:    Physical Exam Constitutional:      General: She is not in acute distress.    Appearance: Normal appearance. She is well-developed.  HENT:     Head: Normocephalic and atraumatic.     Right Ear: External ear normal.     Left Ear: External ear normal.  Eyes:     General: No scleral icterus. Neck:     Thyroid: No thyromegaly.  Cardiovascular:     Rate and Rhythm: Normal rate and regular rhythm.     Heart sounds: Normal heart sounds. No murmur heard. Pulmonary:     Effort: Pulmonary effort is normal. No respiratory distress.     Breath sounds: Normal breath sounds. No wheezing.  Musculoskeletal:     Cervical back: Neck supple.  Skin:    General: Skin is warm and dry.  Neurological:     Mental Status: She is alert and oriented to person, place, and time.  Psychiatric:        Mood and Affect: Mood normal.        Behavior: Behavior normal.        Thought Content: Thought content normal.        Judgment: Judgment normal.      BP 135/80   Pulse 100   Temp 97.6 F (36.4 C) (Oral)   Ht 5\' 5"  (1.651 m)   Wt 186 lb (84.4 kg)   SpO2 100%   BMI 30.95 kg/m  Wt Readings from Last 3 Encounters:  12/18/23 186 lb  (84.4 kg)  11/28/23 186 lb 9.6 oz (84.6 kg)  09/19/23 184 lb (83.5 kg)       Assessment & Plan:   Problem List Items Addressed This Visit       High   Tobacco abuse   She is taking chantix,  smoking 6-7 a day.  Mood is stable, denies weird dreams on chantix.  Encouraged complete cessation.         Unprioritized   Lumbar radiculopathy - Primary   Improving with PT/Dry needling and HS tramadol HS. Continue same.       Hyperlipidemia   Refills sent on zetia/lipitor. Pt to schedule follow up with cardiology.       Relevant Medications   ezetimibe (ZETIA) 10 MG tablet   atorvastatin (LIPITOR) 80 MG tablet   Essential hypertension   BP Readings from Last 3 Encounters:  12/18/23 (!) 148/57  11/28/23 139/69  09/19/23 132/72   Not currently on bp medication. Initial BP elevated but repeat BP OK.      Relevant Medications   ezetimibe (ZETIA) 10 MG tablet   atorvastatin (LIPITOR) 80 MG tablet   Carotid aneurysm, right (HCC)   Last CT 5/23- will repeat. Previously has seen Dr. Danae Orleans.       Relevant Medications   ezetimibe (ZETIA) 10 MG tablet  atorvastatin (LIPITOR) 80 MG tablet   Other Relevant Orders   CT ANGIO HEAD NECK W WO CM   CAD (coronary artery disease)   Has not been seen by cardiology since 2023. Looks like she is out of plavix, zetia, atorvastatin. 30 day refills sent, advised follow up- see mychart.       Relevant Medications   ezetimibe (ZETIA) 10 MG tablet   atorvastatin (LIPITOR) 80 MG tablet   Other Relevant Orders   Ambulatory referral to Cardiology    I have changed Eileen M. Wisz's clopidogrel. I am also having her maintain her multivitamin, APPLE CIDER VINEGAR PO, gabapentin, famotidine, nitroGLYCERIN, mirabegron ER, cyclobenzaprine, traMADol, varenicline, ezetimibe, and atorvastatin.  Meds ordered this encounter  Medications   ezetimibe (ZETIA) 10 MG tablet    Sig: Take 1 tablet (10 mg total) by mouth daily.    Dispense:  30 tablet     Refill:  0    Supervising Provider:   Danise Edge A [[id number]]   atorvastatin (LIPITOR) 80 MG tablet    Sig: Take 1 tablet (80 mg total) by mouth at bedtime.    Dispense:  30 tablet    Refill:  0    Please call our office to schedule an yearly appointment with Dr. Lynnette Caffey for October 2024 before anymore refills. [phone]. Thank you 1st attempt    Supervising Provider:   Danise Edge A [[id number]]   clopidogrel (PLAVIX) 75 MG tablet    Sig: Take 1 tablet (75 mg total) by mouth daily.    Dispense:  30 tablet    Refill:  0    Please call our office to schedule an overdue appointment with Dr. Lynnette Caffey before anymore refills. [phone]. Thank you 3rd and Final Attempt    Supervising Provider:   Danise Edge A [[id number]]

## 2023-12-18 NOTE — Assessment & Plan Note (Signed)
 Improving with PT/Dry needling and HS tramadol HS. Continue same.

## 2023-12-18 NOTE — Telephone Encounter (Signed)
 See mychart.

## 2023-12-19 ENCOUNTER — Ambulatory Visit: Attending: Family | Admitting: Physical Therapy

## 2023-12-19 NOTE — Therapy (Deleted)
 OUTPATIENT PHYSICAL THERAPY THORACOLUMBAR TREATMENT   Patient Name: JETTA MURRAY MRN: [id number] DOB:08-28-1956, 68 y.o., female Today's Date: 12/19/2023  END OF SESSION:     Past Medical History:  Diagnosis Date   Aneurysm (HCC) 2020   supraclinoid R int carotid artery   CAD (coronary artery disease)    LHC 01/03/2022: 30% of pRCA followed by 80% of mRCA s/p DES, 20% of pLAD, &30% of p-mLCX   Chronic tension headaches    Headache    Hyperlipidemia    borderline   Non-ST elevation (NSTEMI) myocardial infarction (HCC) 01/03/2022   Sickle cell trait (HCC)    Thyroid disease    enlarged thyroid   Vertigo    Vitamin D deficiency    Past Surgical History:  Procedure Laterality Date   COLONOSCOPY  2016   COLONOSCOPY WITH PROPOFOL  10/27/2022   CORONARY PRESSURE/FFR STUDY N/A 01/03/2022   Procedure: INTRAVASCULAR PRESSURE WIRE/FFR STUDY;  Surgeon: Kathleene Hazel, MD;  Location: [facility];  Service: Cardiovascular;  Laterality: N/A;   CORONARY STENT INTERVENTION N/A 01/03/2022   Procedure: CORONARY STENT INTERVENTION;  Surgeon: Kathleene Hazel, MD;  [facility]: [facility];  Service: Cardiovascular;  Laterality: N/A;   FOOT SURGERY Bilateral    Pt states she had bones removed from the sides of her feet and middle toes so she could walk better.   LEFT HEART CATH AND CORONARY ANGIOGRAPHY N/A 01/03/2022   Procedure: LEFT HEART CATH AND CORONARY ANGIOGRAPHY;  Surgeon: Kathleene Hazel, MD;  Location: [facility];  Service: Cardiovascular;  Laterality: N/A;   TONSILLECTOMY  09/11/1973   TUBAL LIGATION     Patient Active Problem List   Diagnosis Date Noted   Left shoulder pain 09/19/2023   Soft tissue mass 09/19/2023   Excessive tear production of left lacrimal gland 08/17/2023   Whiplash injuries, initial encounter 02/27/2023   Constipation 02/13/2023   Bilateral foot pain 12/22/2022   Preventative health care 04/14/2022    Ceruminosis, bilateral 04/14/2022   CAD (coronary artery disease) 01/04/2022   Carotid aneurysm, right (HCC) 01/04/2022   Essential hypertension    Hyperlipidemia    History of non-ST elevation myocardial infarction (NSTEMI) 01/03/2022   Overactive bladder 04/12/2021   Lumbar radiculopathy 01/04/2021   Gastroesophageal reflux disease 10/13/2020   Other intervertebral disc degeneration, lumbar region 04/14/2020   Obstructive sleep apnea 11/04/2018   Vitamin D deficiency 12/30/2014   Migraine without status migrainosus, not intractable 12/25/2014   Tobacco abuse 12/25/2014   Carpal tunnel syndrome 12/25/2014   Thyromegaly 11/16/2014   IBS (irritable bowel syndrome) 11/16/2014    PCP: Sandford Craze, NP   REFERRING PROVIDER: Sandford Craze, NP   REFERRING DIAG: M54.16 (ICD-10-CM) - Lumbar radiculopathy   Rationale for Evaluation and Treatment: Rehabilitation  THERAPY DIAG:  No diagnosis found.  ONSET DATE: 1 month ago  SUBJECTIVE:  SUBJECTIVE STATEMENT: *** Able to sleep now if she takes the tramadol, when she wakes up the leg hurts.  Mild now.   PERTINENT HISTORY:  Smoker, CAD, history NSTEMI  PAIN:  Are you having pain? Yes: NPRS scale: 2/10 Pain location: low back  Pain description: ache, just hurts Aggravating factors: lying down after 5 min  Relieving factors: getting up  PRECAUTIONS: None  RED FLAGS: None   WEIGHT BEARING RESTRICTIONS: No  FALLS:  Has patient fallen in last 6 months? No  LIVING ENVIRONMENT: Lives with: lives alone Lives in: House/apartment Stairs: No Has following equipment at home: None  OCCUPATION: making eyeglasses, third shift, sitting primarily   PLOF: Independent  PATIENT GOALS: "I want to be able to sleep"  NEXT MD VISIT: 12/18/23  f/u with PCP  OBJECTIVE:   DIAGNOSTIC FINDINGS:  05/25/2023 MRI Lumbar Spine IMPRESSION: 1. Mild lumbar spine spondylosis as described above. 2. No acute osseous injury of the lumbar spine.  PATIENT SURVEYS:  Modified Oswestry 11/50   MUSCLE LENGTH: Hamstrings: Right 90 deg; Left ~70 deg Prone knee bend: Right 90 deg; Left ~60 deg  PALPATION: Tenderness L lumbar paraspinals, increased edema bilateral ankles (R ankle 22 cm, L ankle 23 cm non-pitting)   LUMBAR ROM:   AROM eval  Flexion To shins, pain with return left side low back  Extension WNL, pressure low back  Right lateral flexion To knee, pulls on L  Left lateral flexion To knee   Right rotation Catches on Le  Left rotation WNL   (Blank rows = not tested)  LOWER EXTREMITY ROM:      LOWER EXTREMITY MMT:    MMT Right eval Left eval  Hip flexion 5* 4+*p! In back  Hip extension    Hip abduction 5* 5*  Hip adduction 4+* 4+* p! In back  Knee flexion 5 4+ p! In back  Knee extension 5 5 p! In back  Ankle dorsiflexion 5 5  Ankle plantarflexion 5 5   (Blank rows = not tested) * tested in sitting   Passive Accessory Intervertebral Motion (PAIVM) Pt confirmsreproduction of back pain with CPA L4-L5 and L UPA L4-L5 . Generally hypomobile throughout    SPECIAL TESTS Lumbar Radiculopathy and Discogenic: Centralization and Peripheralization (SN 92, -LR 0.12): Not examined SLR (SN 92, -LR 0.29): R: Negative L:  Positive Crossed SLR (SP 90): R: Negative L: Negative   Facet Joint: Extension-Rotation (SN 100, -LR 0.0): R: Positive L: Positive  Hip: FABER (SN 81): R: Negative L: Positive FADIR (SN 94): R: Negative L: Negative Hip scour (SN 50): R: Negative L: Negative   SIJ:  Thigh Thrust (SN 88, -LR 0.18) : R: Negative L: Positive   Functional Tasks Sit to stand: 5x sit to stand 15.4 seconds   GAIT: Distance walked: 55' Assistive device utilized: None Level of assistance: Complete  Independence Comments: decreased gait speed but no significant deviations   TODAY'S TREATMENT:  DATE:  12/19/23 ***  12/07/23 Therapeutic Exercise: to improve strength and mobility.  Demo, verbal and tactile cues throughout for technique. Bike L1 x 6 min  Standing lumbar extension - irritating, discontinued Prone leg extensions x 10 bil Supine piriformis stretch PPT x 10  Bridges x 10  SLR x 10  Clamshell 2 x 10 each side.  Manual Therapy: to decrease muscle spasm and pain and improve mobility STM/TPR to L glutes, piriformis, lumbar paraspinals, L UPA mobs to lumbar spine, skilled palpation and monitoring during dry needling. Trigger Point Dry Needling  Initial Treatment: Pt instructed on Dry Needling rational, procedures, and possible side effects. Pt instructed to expect mild to moderate muscle soreness later in the day and/or into the next day.  Pt instructed in methods to reduce muscle soreness. Pt instructed to continue prescribed HEP. Patient was educated on signs and symptoms of infection and other risk factors and advised to seek medical attention should they occur.  Patient verbalized understanding of these instructions and education.   Patient Verbal Consent Given: Yes Education Handout Provided: Yes Muscles Treated: L lumbar multifidi L5, L glut max, L piriformis Electrical Stimulation Performed: No Treatment Response/Outcome: Twitch Response Elicited and Palpable Increase in Muscle Length   11/30/23 Therapeutic Exercise: to improve strength and mobility.  Demo, verbal and tactile cues throughout for technique. Nustep L5 x 6 min -review standing McKenzie exercises LTR x 10  Pelvic tilts x 15 Bridges 2 x 10 - cues for breathing and TrA engagement Prone press-ups x 10 - with overpressure from PT Manual Therapy: to decrease muscle spasm and pain  and improve mobility L UPA mobs to lumbar spine grade 3-4, STM/TPR to lumbar paraspinals, manual lumbar traction.  Modalities: MHP to low back x 10 min (not included in treatment time.     11/19/23 Therapeutic Exercise: to improve strength and mobility.  Demo, verbal and tactile cues throughout for technique.  Nustep L5 x 6 min  Prone leg extensions x 10 R/L Supine bridges x 10  Ant/Post pelvic tilts x 10  NEUROMUSCULAR RE-EDUCATION: to relieve radicular symptoms L side glides x 10 Lumbar extensions at wall x 10  Prone press-ups x 10  Prone press-up with overpressure on L Lumbar spine x 10 Prone knee flexion x 10 B Dynamic Sciatic nerve glide supine and sitting  Manual Therapy: to decrease muscle spasm and pain and improve mobility L UPA mobs to L lumbar spine Manual lumbar traction with legs over ball     PATIENT EDUCATION:  Education details: review TrDN, HEP update Person educated: Patient Education method: Explanation, Demonstration, Verbal cues, and Handouts Education comprehension: verbalized understanding and returned demonstration  HOME EXERCISE PROGRAM: Access Code: [email or web address] URL: [email or web address] Date: 11/19/2023 Prepared by: Harrie Foreman  Exercises - Lying Prone  - 1 x daily - 7 x weekly - 3 sets - 10 reps - Prone Knee Flexion  - 1 x daily - 7 x weekly - 1 sets - 10 reps - Left Standing Lateral Shift Correction at Wall - Repetitions  - 3 x daily - 7 x weekly - 1 sets - 10 reps - Standing Lumbar Extension at Wall - Forearms  - 3 x daily - 7 x weekly - 1 sets - 10 reps - Prone Press Up On Elbows  - 1 x daily - 7 x weekly - 2 sets - 10 reps - 1 min hold hold - Prone Off Center Lumbar Extension Press Up  - 1 x daily - 7  x weekly - 2 sets - 10 reps - Standing Lumbar Extension  - 10 x daily - 7 x weekly - 1 sets - 10 reps - Supine Lower Trunk Rotation  - 1 x daily - 7 x weekly - 1 sets - 10 reps - Supine Posterior Pelvic Tilt  - 1 x daily - 7 x  weekly - 1 sets - 10 reps - Supine Bridge  - 1 x daily - 7 x weekly - 1 sets - 10 reps - Supine 90/90 Sciatic Nerve Glide with Knee Flexion/Extension  - 1 x daily - 7 x weekly - 3 sets - 10 reps  Patient Education - Sleep Positions  ASSESSMENT:  CLINICAL IMPRESSION: LANNAH KOIKE ***  continues to report improved sleep with medication, and mild low back pain during the day.  After explanation of DN rational, procedures, outcomes and potential side effects, patient verbalized consent to DN treatment in conjunction with manual STM/DTM and TPR to reduce ttp/muscle tension. Muscles treated as indicated above. DN produced normal response with good twitches elicited resulting in palpable reduction in pain/ttp and muscle tension, with patient noting less pain upon initiation of movement following DN. Pt educated to expect mild to moderate muscle soreness for up to 24-48 hrs and instructed to continue prescribed home exercise program and current activity level with pt verbalizing understanding of theses instructions.  After manual therapy then performed exercises to activate deep core and help stretch glutes to prevent soreness.  HEP updated. TALAR FRALEY continues to demonstrate potential for improvement and would benefit from continued skilled therapy to address impairments.     OBJECTIVE IMPAIRMENTS: decreased activity tolerance, decreased ROM, decreased strength, hypomobility, increased edema, increased fascial restrictions, impaired perceived functional ability, increased muscle spasms, and pain.   ACTIVITY LIMITATIONS: lifting, bending, sitting, sleeping, and transfers  PARTICIPATION LIMITATIONS: occupation  PERSONAL FACTORS: Time since onset of injury/illness/exacerbation and 3+ comorbidities: Smoker, CAD, history NSTEMI, chronic LBP  are also affecting patient's functional outcome.   REHAB POTENTIAL: Good  CLINICAL DECISION MAKING: Evolving/moderate complexity  EVALUATION  COMPLEXITY: Moderate   GOALS: Goals reviewed with patient? Yes  SHORT TERM GOALS: Target date: 11/23/2023   Patient will be independent with initial HEP.  Baseline: given Goal status: MET 11/19/23  2.   Patient will report centralization of radicular symptoms.  Baseline: down to L ankle Goal status: IN PROGRESS  LONG TERM GOALS: Target date: 12/21/2023   Patient will be independent with advanced/ongoing HEP to improve outcomes and carryover.  Baseline:  Goal status: IN PROGRESS  2.  Patient will report 75% improvement in low back/LLE  pain to improve QOL.  Baseline:  Goal status: IN PROGRESS  3.  Patient will demonstrate full pain free lumbar ROM to perform ADLs.   Baseline: see objective Goal status: IN PROGRESS  4. Patient will report being able to sleep without interruption from LLE pain.  Baseline: LLE pain wakes after laying down for 5 min  Goal status: IN PROGRESS  5.  Patient will report at least 6 points improvement on modified Oswestry to demonstrate improved functional ability.  Baseline: 11/50 Goal status: IN PROGRESS      PLAN:  PT FREQUENCY: 1-2x/week  PT DURATION: 6 weeks  PLANNED INTERVENTIONS: 97110-Therapeutic exercises, 97530- Therapeutic activity, [id number]- Neuromuscular re-education, 97535- Self Care, [id number]- Manual therapy, [phone]- Gait training, [phone]- Orthotic Fit/training, [phone]- Electrical stimulation (unattended), [phone]- Electrical stimulation (manual), [id number]- Ultrasound, 47425- Traction (mechanical), Patient/Family education, Balance training, Stair training, Taping, Dry Needling,  Joint mobilization, Joint manipulation, Spinal manipulation, Spinal mobilization, Cryotherapy, and Moist heat.  PLAN FOR NEXT SESSION: Assess response to TrDN.  progress McKenzie extension exercises as tolerated, manual therapy, modalities PRN.    Daishaun Ayre April Ma L Gerell Fortson, PT 12/19/2023, 12:35 PM

## 2023-12-21 ENCOUNTER — Other Ambulatory Visit: Payer: Self-pay | Admitting: Family

## 2023-12-21 DIAGNOSIS — Z720 Tobacco use: Secondary | ICD-10-CM

## 2023-12-25 ENCOUNTER — Ambulatory Visit: Payer: BC Managed Care – PPO | Admitting: Family

## 2023-12-26 ENCOUNTER — Telehealth (HOSPITAL_BASED_OUTPATIENT_CLINIC_OR_DEPARTMENT_OTHER): Payer: Self-pay

## 2024-01-07 ENCOUNTER — Telehealth (HOSPITAL_BASED_OUTPATIENT_CLINIC_OR_DEPARTMENT_OTHER): Payer: Self-pay

## 2024-01-08 ENCOUNTER — Ambulatory Visit (HOSPITAL_BASED_OUTPATIENT_CLINIC_OR_DEPARTMENT_OTHER)
Admission: RE | Admit: 2024-01-08 | Discharge: 2024-01-08 | Disposition: A | Discharge status: Home / Self Care | Source: Ambulatory Visit | Attending: Family | Admitting: Family

## 2024-01-08 ENCOUNTER — Telehealth: Payer: Self-pay | Admitting: Family

## 2024-01-08 ENCOUNTER — Encounter (HOSPITAL_BASED_OUTPATIENT_CLINIC_OR_DEPARTMENT_OTHER): Payer: Self-pay

## 2024-01-08 ENCOUNTER — Encounter: Payer: Self-pay | Admitting: Family

## 2024-01-08 DIAGNOSIS — I720 Aneurysm of carotid artery: Secondary | ICD-10-CM | POA: Diagnosis not present

## 2024-01-08 DIAGNOSIS — I6523 Occlusion and stenosis of bilateral carotid arteries: Secondary | ICD-10-CM | POA: Diagnosis not present

## 2024-01-08 DIAGNOSIS — I672 Cerebral atherosclerosis: Secondary | ICD-10-CM | POA: Diagnosis not present

## 2024-01-08 DIAGNOSIS — I671 Cerebral aneurysm, nonruptured: Secondary | ICD-10-CM | POA: Diagnosis not present

## 2024-01-08 MED ORDER — IOHEXOL 350 MG/ML SOLN
100.0000 mL | Freq: Once | INTRAVENOUS | Status: AC | PRN
Start: 2024-01-08 — End: 2024-01-08
  Administered 2024-01-08: 75 mL via INTRAVENOUS

## 2024-01-08 NOTE — Telephone Encounter (Signed)
Spoke to patient and questions answered.

## 2024-01-08 NOTE — Telephone Encounter (Signed)
-----   Message from Omega Bible sent at 01/08/2024  3:13 PM EDT ----- Reviewed reports. Continue medical mgmt. No need for ultrasound at this time. -VRP ----- Message ----- From: Dorrene Gaucher, NP Sent: 01/08/2024  12:54 PM EDT To: Omega Bible, MD  Dr. Muriel Arm,  I ordered the annual follow up CTA head on this mutual patient.  Would you mind reviewing the report please?  My concern is the following:  Extracranial CTA positive for right > left Cervical ICA Fibromuscular Dysplasia (FMD), and up to moderate atherosclerotic stenosis of the left vertebral artery origin.  Besides medical management, does she need any additional follow up such as carotid ultrasound?  Thanks,   Dorrene Gaucher NP

## 2024-01-17 ENCOUNTER — Other Ambulatory Visit: Payer: Self-pay | Admitting: Family

## 2024-01-31 ENCOUNTER — Other Ambulatory Visit: Payer: Self-pay | Admitting: Family

## 2024-02-18 NOTE — Progress Notes (Addendum)
 Cardiology Office Note    Patient Name: Jaime Harris Date of Encounter: 02/18/2024  Primary Care Provider:  Dorrene Gaucher, NP Primary Cardiologist:  Jaime Phy, MD Primary Electrophysiologist: None   Past Medical History    Past Medical History:  Diagnosis Date   Aneurysm [facility]) 2020   supraclinoid R int carotid artery   CAD (coronary artery disease)    LHC 01/03/2022: 30% of pRCA followed by 80% of mRCA s/p DES, 20% of pLAD, &30% of p-mLCX   Chronic tension headaches    Headache    Hyperlipidemia    borderline   Non-ST elevation (NSTEMI) myocardial infarction (HCC) 01/03/2022   Sickle cell trait (HCC)    Thyroid  disease    enlarged thyroid    Vertigo    Vitamin D  deficiency     History of Present Illness  Jaime Harris is a 68 y.o. female with a PMH of CAD s/p NSTEMI 12/2021 with DES to mid RCA, HLD, right supraclinoid ICA 3.5 cms, enlarged thyroid , family history of CAD tobacco abuse, sickle cell trait, aortic atherosclerosis who presents today for overdue follow-up.  Jaime Harris is currently followed by Jaime Harris for management of coronary artery disease.  She underwent a previous CT scan in 2017 that showed a 3.5 mm aneurysm within the supraclinoid right internal carotid artery and was evaluated by Jaime Harris with referral placed to neurosurgery for further evaluation.  She was seen by Jaime Harris with MRA of the head completed in 12/2018 showing stable small right supraclinoid aneurysm measuring 2mm with no change from CTA in 2017    She presented to the ED on 01/03/2022 with complaint of chest discomfort and was found to have NSTEMI.  She underwent LHC that showed 80% mid RCA stenosis, 30% proximal RCA stenosis, 20% proximal LAD stenosis, circumflex to mid circumflex lesion of 30% treated with DES/PCI x 1 to mid RCA.  He was discharged with DAPT and was doing well at follow-up.  She had reduced her tobacco abuse down from 1 pack/day to 4 cigarettes.  She was  last seen by Jaime Harris on 06/13/2022 and had Brilinta  switched to Plavix  with plan to continue monotherapy.  She was not at goal with LDL and was started on ezetimibe  10 mg daily and underwent abdominal ultrasound to screen for AAA that was normal.  [name] presents today for overdue annual follow-up.  Reports doing well overall but does experience episodes of sharp, spontaneous chest pain on the left side, lasting about a minute and occurring approximately once a month. This pain does not radiate to the neck or arm and differs from the pain during her heart attack. She has a history of carotid aneurysm, identified in 2017, with stability confirmed by a repeat MRA in 2020 and a recent CT scan in April showing no significant stenosis. She also has fibromuscular dysplasia and moderate atherosclerosis of the left vertebral artery. She has emphysema, identified in a CT lung screening in 2024, and experiences wheezing on the right side. She does not use an inhaler and has not consulted a pulmonologist. She has reduced her tobacco use from one pack per day to four cigarettes, but recently increased to half a pack per day due to stress. She works in an [employer] and experiences occasional leg swelling, particularly when sitting for extended periods.  In reviewing her medications LDL that were not at goal she reported not taking atorvastatin  since beginning ezetimibe  due to misunderstanding when beginning  ezetimibe . Patient denies palpitations, dyspnea, PND, orthopnea, nausea, vomiting, dizziness, syncope, edema, weight gain, or early satiety.  Discussed the use of AI scribe software for clinical note transcription with the patient, who gave verbal consent to proceed.  History of Present Illness   Review of Systems  Please see the history of present illness.    All other systems reviewed and are otherwise negative except as noted above.  Physical Exam     Wt Readings from Last 3  Encounters:  12/18/23 186 lb (84.4 kg)  11/28/23 186 lb 9.6 oz (84.6 kg)  09/19/23 184 lb (83.5 kg)   WU:JWJXB were no vitals filed for this visit.,There is no height or weight on file to calculate BMI. GEN: Well nourished, well developed in no acute distress Neck: No JVD; No carotid bruits Pulmonary: Clear to auscultation without rales, wheezing or rhonchi  Cardiovascular: Normal rate. Regular rhythm. Normal S1. Normal S2.   Murmurs: There is no murmur.  ABDOMEN: Soft, non-tender, non-distended EXTREMITIES:  No edema; No deformity   EKG/LABS/ Recent Cardiac Studies   ECG personally reviewed by me today -sinus rhythm with left axis deviation and rate of 85 bpm no acute changes consistent with previous EKG.  Risk Assessment/Calculations:          Lab Results  Component Value Date   WBC 9.4 01/04/2022   HGB 12.6 01/04/2022   HCT 36.5 01/04/2022   MCV 84.9 01/04/2022   PLT 179 01/04/2022   Lab Results  Component Value Date   CREATININE 0.89 08/17/2023   BUN 11 08/17/2023   NA 140 08/17/2023   K 4.1 08/17/2023   CL 102 08/17/2023   CO2 31 08/17/2023   Lab Results  Component Value Date   CHOL 189 08/17/2023   HDL 47.50 08/17/2023   LDLCALC 115 (H) 08/17/2023   LDLDIRECT 132.0 04/12/2021   TRIG 135.0 08/17/2023   CHOLHDL 4 08/17/2023    Lab Results  Component Value Date   HGBA1C 5.8 (H) 04/14/2022   Assessment & Plan   Assessment & Plan   1.  CAD/precordial pain: -s/p NSTEMI 12/2021 with DES to mid RCA and current complaint of spontaneous sharp chest pain -  patient elected to pursue stress testing with Lexiscan Myoview - Continue current GDMT with Plavix  75 mg 80 mg Nitrostat  10 mg - Precautions discussed and patient advised to use for Nitrostat  if chest pain occurs at rest.  2.  Essential hypertension: - Patient blood pressure today is stable at 136/84 - Patient advised to monitor blood pressures at home due to history of aneurysm with plan to add medication  if greater than goal of 130/80  3.  Hyperlipidemia: -LDL cholesterol at 115 mg/dL miscommunication regarding atorvastatin  which was discontinued when Zetia  10 mg was started. - Reorder atorvastatin  and ensure she takes both ezetimibe  and atorvastatin . - Schedule follow-up in 6-8 weeks to check cholesterol levels and liver function.  4.Supraclinoid Right Internal Carotid Aneurysm: - Recent surveillance CT completed showing calcification and narrowing blood vessels in the brain results reviewed by Jaime Harris with plan to continue Plavix  and lifestyle modifications with tobacco abuse and cholesterol.  5.  Tobacco abuse: -Increased tobacco use to half a pack per day. Discussed smoking's impact on cardiovascular and pulmonary health. Encouraged smoking cessation. - Discuss smoking cessation options with primary care provider, including nicotine  patches. - Encourage follow-up with primary care provider in July to discuss smoking cessation strategies.  6. Emphysema: Emphysema with wheezing. Pulmonary evaluation and management  needed to prevent progression. Emphasized annual lung screening. - Refer to pulmonologist for evaluation and management of emphysema. - Encourage annual lung screening and follow-up with primary care provider.  Disposition: Follow-up with Arun K Thukkani, MD or APP in 6 months Informed Consent   Shared Decision Making/Informed Consent The risks [chest pain, shortness of breath, cardiac arrhythmias, dizziness, blood pressure fluctuations, myocardial infarction, stroke/transient ischemic attack, nausea, vomiting, allergic reaction, radiation exposure, metallic taste sensation and life-threatening complications (estimated to be 1 in 10,000)], benefits (risk stratification, diagnosing coronary artery disease, treatment guidance) and alternatives of a nuclear stress test were discussed in detail with Ms. Kaleta and she agrees to proceed.      Signed, Francene Ing, Retha Cast,  NP 02/18/2024, 6:04 PM [facility]

## 2024-02-19 ENCOUNTER — Encounter: Payer: Self-pay | Admitting: Nurse Practitioner

## 2024-02-19 ENCOUNTER — Telehealth (HOSPITAL_COMMUNITY): Payer: Self-pay

## 2024-02-19 ENCOUNTER — Ambulatory Visit: Attending: Cardiology | Admitting: Nurse Practitioner

## 2024-02-19 VITALS — BP 136/84 | HR 85 | Ht 65.0 in | Wt 182.8 lb

## 2024-02-19 DIAGNOSIS — I10 Essential (primary) hypertension: Secondary | ICD-10-CM

## 2024-02-19 DIAGNOSIS — I720 Aneurysm of carotid artery: Secondary | ICD-10-CM

## 2024-02-19 DIAGNOSIS — E785 Hyperlipidemia, unspecified: Secondary | ICD-10-CM

## 2024-02-19 DIAGNOSIS — I2510 Atherosclerotic heart disease of native coronary artery without angina pectoris: Secondary | ICD-10-CM

## 2024-02-19 DIAGNOSIS — Z720 Tobacco use: Secondary | ICD-10-CM

## 2024-02-19 DIAGNOSIS — R072 Precordial pain: Secondary | ICD-10-CM | POA: Diagnosis not present

## 2024-02-19 MED ORDER — ATORVASTATIN CALCIUM 80 MG PO TABS: 80.0000 mg | ORAL_TABLET | Freq: Every day | ORAL | 3 refills | Status: AC

## 2024-02-19 NOTE — Patient Instructions (Addendum)
 Medication Instructions:  REMEMBER TO TAKE NITROGLYCERIN  AS NEEDED FOR CHEST PAIN RESTART LIPITOR  (ATORVASTATIN ) 80MG  TAKE 1 TABLET ONCE A DAY *If you need a refill on your cardiac medications before your next appointment, please call your pharmacy*  Lab Work: 8 WEEKS FASTING LABS-LIPIDS & CMET (04/15/2024) If you have labs (blood work) drawn today and your tests are completely normal, you will receive your results only by: MyChart Message (if you have MyChart) OR A paper copy in the mail If you have any lab test that is abnormal or we need to change your treatment, we will call you to review the results.  Testing/Procedures: Your physician has requested that you have a lexiscan myoview. For further information please visit [email or web address] Please follow instruction sheet, as given.   Follow-Up: At [facility], you and your health needs are our priority.  As part of our continuing mission to provide you with exceptional heart care, our providers are all part of one team.  This team includes your primary Cardiologist (physician) and Advanced Practice Providers or APPs (Physician Assistants and Nurse Practitioners) who all work together to provide you with the care you need, when you need it.  Your next appointment:   6 month(s)  Provider:   Charles Connor, NP  We recommend signing up for the patient portal called "MyChart".  Sign up information is provided on this After Visit Summary.  MyChart is used to connect with patients for Virtual Visits (Telemedicine).  Patients are able to view lab/test results, encounter notes, upcoming appointments, etc.  Non-urgent messages can be sent to your provider as well.   To learn more about what you can do with MyChart, go to [email or web address].   Other Instructions

## 2024-02-19 NOTE — Telephone Encounter (Signed)
 Detailed instructions left on the patient's answering machine. Jaime Harris CCT

## 2024-02-20 ENCOUNTER — Other Ambulatory Visit: Payer: Self-pay | Admitting: Nurse Practitioner

## 2024-02-20 DIAGNOSIS — Z720 Tobacco use: Secondary | ICD-10-CM

## 2024-02-20 DIAGNOSIS — I10 Essential (primary) hypertension: Secondary | ICD-10-CM

## 2024-02-20 DIAGNOSIS — R079 Chest pain, unspecified: Secondary | ICD-10-CM

## 2024-02-20 DIAGNOSIS — I2510 Atherosclerotic heart disease of native coronary artery without angina pectoris: Secondary | ICD-10-CM

## 2024-02-20 DIAGNOSIS — R072 Precordial pain: Secondary | ICD-10-CM

## 2024-02-20 DIAGNOSIS — E785 Hyperlipidemia, unspecified: Secondary | ICD-10-CM

## 2024-02-20 DIAGNOSIS — I720 Aneurysm of carotid artery: Secondary | ICD-10-CM

## 2024-02-23 ENCOUNTER — Other Ambulatory Visit: Payer: Self-pay | Admitting: Family

## 2024-02-23 DIAGNOSIS — Z720 Tobacco use: Secondary | ICD-10-CM

## 2024-02-26 ENCOUNTER — Ambulatory Visit (HOSPITAL_COMMUNITY)
Admission: RE | Admit: 2024-02-26 | Discharge: 2024-02-26 | Disposition: A | Discharge status: Home / Self Care | Source: Ambulatory Visit | Attending: Nurse Practitioner | Admitting: Nurse Practitioner

## 2024-02-26 ENCOUNTER — Ambulatory Visit: Payer: Self-pay | Admitting: Nurse Practitioner

## 2024-02-26 DIAGNOSIS — R072 Precordial pain: Secondary | ICD-10-CM | POA: Insufficient documentation

## 2024-02-26 DIAGNOSIS — Z720 Tobacco use: Secondary | ICD-10-CM | POA: Insufficient documentation

## 2024-02-26 DIAGNOSIS — I720 Aneurysm of carotid artery: Secondary | ICD-10-CM | POA: Insufficient documentation

## 2024-02-26 DIAGNOSIS — E785 Hyperlipidemia, unspecified: Secondary | ICD-10-CM | POA: Insufficient documentation

## 2024-02-26 DIAGNOSIS — I2510 Atherosclerotic heart disease of native coronary artery without angina pectoris: Secondary | ICD-10-CM | POA: Insufficient documentation

## 2024-02-26 DIAGNOSIS — R079 Chest pain, unspecified: Secondary | ICD-10-CM | POA: Insufficient documentation

## 2024-02-26 DIAGNOSIS — I10 Essential (primary) hypertension: Secondary | ICD-10-CM | POA: Diagnosis not present

## 2024-02-26 LAB — MYOCARDIAL PERFUSION IMAGING
Base ST Depression (mm): 0 mm
LV dias vol: 58 mL (ref 46–106)
LV sys vol: 22 mL (ref 3.8–5.2)
Nuc Stress EF: 62 %
Peak HR: 114 {beats}/min
Rest HR: 84 {beats}/min
Rest Nuclear Isotope Dose: 11 mCi
SDS: 0
SRS: 6
SSS: 3
ST Depression (mm): 0 mm
Stress Nuclear Isotope Dose: 32.3 mCi
TID: 1.25

## 2024-02-26 MED ORDER — TECHNETIUM TC 99M TETROFOSMIN IV KIT
11.0000 | PACK | Freq: Once | INTRAVENOUS | Status: AC | PRN
  Administered 2024-02-26: 11 via INTRAVENOUS

## 2024-02-26 MED ORDER — REGADENOSON 0.4 MG/5ML IV SOLN
0.4000 mg | Freq: Once | INTRAVENOUS | Status: AC
  Administered 2024-02-26: 0.4 mg via INTRAVENOUS

## 2024-02-26 MED ORDER — TECHNETIUM TC 99M TETROFOSMIN IV KIT
32.3000 | PACK | Freq: Once | INTRAVENOUS | Status: AC | PRN
  Administered 2024-02-26: 32.3 via INTRAVENOUS

## 2024-02-26 MED ORDER — REGADENOSON 0.4 MG/5ML IV SOLN
INTRAVENOUS | Status: AC
  Filled 2024-02-26: qty 5

## 2024-03-07 ENCOUNTER — Ambulatory Visit: Payer: Self-pay | Admitting: Nurse Practitioner

## 2024-03-07 ENCOUNTER — Other Ambulatory Visit: Payer: Self-pay

## 2024-03-07 DIAGNOSIS — N3281 Overactive bladder: Secondary | ICD-10-CM

## 2024-03-07 MED ORDER — MIRABEGRON ER 25 MG PO TB24: 25.0000 mg | ORAL_TABLET | Freq: Every day | ORAL | 5 refills | Status: AC

## 2024-03-10 ENCOUNTER — Other Ambulatory Visit: Payer: Self-pay | Admitting: Family

## 2024-03-10 MED ORDER — CLOPIDOGREL BISULFATE 75 MG PO TABS: 75.0000 mg | ORAL_TABLET | Freq: Every day | ORAL | 0 refills | Status: DC

## 2024-03-10 NOTE — Telephone Encounter (Signed)
 Copied from CRM [id number]. Topic: Clinical - Medication Refill >> Mar 10, 2024  8:09 AM Martinique E wrote: Medication: clopidogrel  (PLAVIX ) 75 MG tablet   Has the patient contacted their pharmacy? No (Agent: If no, request that the patient contact the pharmacy for the refill. If patient does not wish to contact the pharmacy document the reason why and proceed with request.) (Agent: If yes, when and what did the pharmacy advise?)  This is the patient's preferred pharmacy:  [id number] - [place], [place] - [address] [place]  [address] Phone: [phone] Fax: [phone]   Is this the correct pharmacy for this prescription? Yes If no, delete pharmacy and type the correct one.   Has the prescription been filled recently? No  Is the patient out of the medication? No, 2 pills left.  Has the patient been seen for an appointment in the last year OR does the patient have an upcoming appointment? Yes  Can we respond through MyChart? Yes  Agent: Please be advised that Rx refills may take up to 3 business days. We ask that you follow-up with your pharmacy.

## 2024-03-17 ENCOUNTER — Other Ambulatory Visit: Payer: Self-pay

## 2024-03-17 DIAGNOSIS — R072 Precordial pain: Secondary | ICD-10-CM

## 2024-03-17 DIAGNOSIS — E785 Hyperlipidemia, unspecified: Secondary | ICD-10-CM

## 2024-03-17 DIAGNOSIS — I10 Essential (primary) hypertension: Secondary | ICD-10-CM

## 2024-03-17 DIAGNOSIS — I2510 Atherosclerotic heart disease of native coronary artery without angina pectoris: Secondary | ICD-10-CM

## 2024-03-17 DIAGNOSIS — I720 Aneurysm of carotid artery: Secondary | ICD-10-CM

## 2024-03-17 DIAGNOSIS — Z720 Tobacco use: Secondary | ICD-10-CM

## 2024-03-31 NOTE — Telephone Encounter (Unsigned)
 Copied from CRM [id number]. Topic: General - Other >> Mar 31, 2024  3:44 PM Lavanda D wrote: Reason for CRM: Patient is calling for an update on her FMLA forms, she called back on the 16th and had not received an update quit yet. She said she would like any type of update, she also wanted to advise that her claim had been denied due to the paperwork not being completed.

## 2024-03-31 NOTE — Telephone Encounter (Unsigned)
 Copied from CRM [id number]. Topic: General - Other >> Mar 31, 2024  3:44 PM Jaime Harris wrote: Reason for CRM: Patient is calling for an update on her FMLA forms, she called back on the 16th and had not received an update quit yet. She said she would like any type of update, she also wanted to advise that her claim had been denied due to the paperwork not being completed.

## 2024-04-01 ENCOUNTER — Ambulatory Visit: Admitting: Family

## 2024-04-01 NOTE — Telephone Encounter (Signed)
 Patient notified we have not receive a FMLA form from her employer. She said it may take one more day for them to send

## 2024-04-03 ENCOUNTER — Ambulatory Visit (INDEPENDENT_AMBULATORY_CARE_PROVIDER_SITE_OTHER): Admitting: Family

## 2024-04-03 ENCOUNTER — Ambulatory Visit: Admitting: Family

## 2024-04-03 VITALS — BP 136/72 | HR 93 | Temp 98.9°F | Resp 98 | Ht 65.0 in | Wt 183.2 lb

## 2024-04-03 DIAGNOSIS — E785 Hyperlipidemia, unspecified: Secondary | ICD-10-CM

## 2024-04-03 DIAGNOSIS — M545 Low back pain, unspecified: Secondary | ICD-10-CM

## 2024-04-03 DIAGNOSIS — K929 Disease of digestive system, unspecified: Secondary | ICD-10-CM

## 2024-04-03 DIAGNOSIS — I720 Aneurysm of carotid artery: Secondary | ICD-10-CM

## 2024-04-03 DIAGNOSIS — N3281 Overactive bladder: Secondary | ICD-10-CM | POA: Diagnosis not present

## 2024-04-03 DIAGNOSIS — K219 Gastro-esophageal reflux disease without esophagitis: Secondary | ICD-10-CM

## 2024-04-03 DIAGNOSIS — Z720 Tobacco use: Secondary | ICD-10-CM | POA: Diagnosis not present

## 2024-04-03 DIAGNOSIS — I10 Essential (primary) hypertension: Secondary | ICD-10-CM

## 2024-04-03 DIAGNOSIS — M7989 Other specified soft tissue disorders: Secondary | ICD-10-CM

## 2024-04-03 DIAGNOSIS — I2510 Atherosclerotic heart disease of native coronary artery without angina pectoris: Secondary | ICD-10-CM

## 2024-04-03 MED ORDER — CLOPIDOGREL BISULFATE 75 MG PO TABS: 75.0000 mg | ORAL_TABLET | Freq: Every day | ORAL | 0 refills | Status: AC

## 2024-04-03 MED ORDER — EZETIMIBE 10 MG PO TABS: 10.0000 mg | ORAL_TABLET | Freq: Every day | ORAL | 0 refills | Status: AC

## 2024-04-03 MED ORDER — BUPROPION HCL ER (SR) 150 MG PO TB12: 150.0000 mg | ORAL_TABLET | Freq: Two times a day (BID) | ORAL | 2 refills | Status: DC

## 2024-04-03 NOTE — Progress Notes (Signed)
 Subjective:     Patient ID: Jaime Harris, female    DOB: 1956/01/27, 68 y.o.   MRN: [id number]  Chief Complaint  Patient presents with   Hypertension    Here for follow up   Hyperlipidemia    Here for follow up    HPI  Discussed the use of AI scribe software for clinical note transcription with the patient, who gave verbal consent to proceed.  History of Present Illness Jaime Harris is a 68 year old female who presents with gastrointestinal discomfort and smoking cessation concerns.  She experiences gastrointestinal discomfort, describing her food as not digesting properly and often requiring medication for relief. A colonoscopy and CT scan revealed a tiny hiatal hernia. She feels a sensation of food not fully passing and has recently experienced chest pain. She has stopped taking Pepcid .  She has a history of smoking, which affects her breathing, especially when lying down. She previously tried Wellbutrin  with some relief but has not used nicotine  patches.  She experiences constipation, requiring medication for relief, and has low back pain, previously managed with tramadol  and Flexeril .  Her past medical history includes a CT angio of the head and a stress test, which noted a small hiatal hernia, aortic calcification, and emphysema changes. A cyst on her left kidney was not concerning. She is on atorvastatin  and ezetimibe  for cholesterol management, with her last check slightly above goal.      Health Maintenance Due  Topic Date Due   Zoster Vaccines- Shingrix (1 of 2) Never done    Past Medical History:  Diagnosis Date   Aneurysm (HCC) 2020   supraclinoid R int carotid artery   CAD (coronary artery disease)    LHC 01/03/2022: 30% of pRCA followed by 80% of mRCA s/p DES, 20% of pLAD, &30% of p-mLCX   Chronic tension headaches    Headache    Hyperlipidemia    borderline   Non-ST elevation (NSTEMI) myocardial infarction (HCC) 01/03/2022   Sickle cell trait  (HCC)    Thyroid  disease    enlarged thyroid    Vertigo    Vitamin D  deficiency     Past Surgical History:  Procedure Laterality Date   COLONOSCOPY  2016   COLONOSCOPY WITH PROPOFOL   10/27/2022   CORONARY PRESSURE/FFR STUDY N/A 01/03/2022   Procedure: INTRAVASCULAR PRESSURE WIRE/FFR STUDY;  Surgeon: Verlin Lonni BIRCH, MD;  Location: [facility];  Service: Cardiovascular;  Laterality: N/A;   CORONARY STENT INTERVENTION N/A 01/03/2022   Procedure: CORONARY STENT INTERVENTION;  Surgeon: Verlin Lonni BIRCH, MD;  Location: [facility];  Service: Cardiovascular;  Laterality: N/A;   FOOT SURGERY Bilateral    Pt states she had bones removed from the sides of her feet and middle toes so she could walk better.   LEFT HEART CATH AND CORONARY ANGIOGRAPHY N/A 01/03/2022   Procedure: LEFT HEART CATH AND CORONARY ANGIOGRAPHY;  Surgeon: Verlin Lonni BIRCH, MD;  Location: [facility];  Service: Cardiovascular;  Laterality: N/A;   TONSILLECTOMY  09/11/1973   TUBAL LIGATION      Family History  Problem Relation Age of Onset   Heart disease Mother        pacemaker/defibrillator   Diabetes Mother    Hypertension Sister    Diabetes Brother    Heart disease Paternal Grandmother        died of CHF   Diabetes Paternal Grandmother    Cancer Neg Hx    Kidney disease  Neg Hx    Colon cancer Neg Hx    Colon polyps Neg Hx    Rectal cancer Neg Hx    Stomach cancer Neg Hx    Esophageal cancer Neg Hx     Social History   Socioeconomic History   Marital status: Single    Spouse name: Not on file   Number of children: 3   Years of education: Not on file   Highest education level: GED or equivalent  Occupational History    Comment: Conservation officer, nature  Tobacco Use   Smoking status: Every Day    Current packs/day: 0.50    Types: Cigarettes    Passive exposure: Never   Smokeless tobacco: Never  Vaping Use   Vaping status: Never Used  Substance and Sexual Activity    Alcohol use: Yes    Comment: occasional   Drug use: No   Sexual activity: Not Currently    Partners: Male    Birth control/protection: Post-menopausal  Other Topics Concern   Not on file  Social History Narrative   Makes eye glasses.     Single   GED   She has 3 children   2- Son Francis- lives locally- he has one son   92- Daughter Freddy- 7 children (4 of these children live with the pt- she is a primary care giver)   1985- michael- lives in [place] 1 daughter   Enjoys reading   Caffeine- coffee, 4 cups daily, soda every other day   Social Drivers of Health   Financial Resource Strain: Low Risk  (04/03/2024)   Overall Financial Resource Strain (CARDIA)    Difficulty of Paying Living Expenses: Not very hard  Food Insecurity: Food Insecurity Present (04/03/2024)   Hunger Vital Sign    Worried About Running Out of Food in the Last Year: Never true    Ran Out of Food in the Last Year: Sometimes true  Transportation Needs: No Transportation Needs (04/03/2024)   PRAPARE - Administrator, Civil Service (Medical): No    Lack of Transportation (Non-Medical): No  Physical Activity: Inactive (04/03/2024)   Exercise Vital Sign    Days of Exercise per Week: 0 days    Minutes of Exercise per Session: Not on file  Stress: No Stress Concern Present (04/03/2024)   [employer] of Occupational Health - Occupational Stress Questionnaire    Feeling of Stress: Only a little  Social Connections: Moderately Isolated (04/03/2024)   Social Connection and Isolation Panel    Frequency of Communication with Friends and Family: More than three times a week    Frequency of Social Gatherings with Friends and Family: More than three times a week    Attends Religious Services: More than 4 times per year    Active Member of [employer] or Organizations: No    Attends Engineer, structural: Not on file    Marital Status: Divorced  Intimate Partner Violence: Not on file     Outpatient Medications Prior to Visit  Medication Sig Dispense Refill   APPLE CIDER VINEGAR PO Take 1 capsule by mouth daily.     atorvastatin  (LIPITOR ) 80 MG tablet Take 1 tablet (80 mg total) by mouth daily. 90 tablet 3   cyclobenzaprine  (FLEXERIL ) 5 MG tablet Take 1 tablet (5 mg total) by mouth 3 (three) times daily as needed for muscle spasms. 30 tablet 0   mirabegron  ER (MYRBETRIQ ) 25 MG TB24 tablet Take 1 tablet (25 mg total) by mouth daily.  30 tablet 5   Multiple Vitamin (MULTIVITAMIN) tablet Take 1 tablet by mouth daily.     nitroGLYCERIN  (NITROSTAT ) 0.4 MG SL tablet Place 1 tablet (0.4 mg total) under the tongue every 5 (five) minutes as needed for chest pain. 12 tablet 0   clopidogrel  (PLAVIX ) 75 MG tablet Take 1 tablet (75 mg total) by mouth daily. 90 tablet 0   ezetimibe  (ZETIA ) 10 MG tablet Take 1 tablet (10 mg total) by mouth daily. 90 tablet 0   famotidine  (PEPCID ) 20 MG tablet TAKE 1 TABLET BY MOUTH EVERY DAY 15 tablet 0   varenicline  (CHANTIX ) 0.5 MG tablet TAKE 1 TABLET BY MOUTH TWICE A DAY 60 tablet 0   No facility-administered medications prior to visit.    No Known Allergies  ROS See HPI    Objective:    Physical Exam Constitutional:      General: She is not in acute distress.    Appearance: Normal appearance. She is well-developed.  HENT:     Head: Normocephalic and atraumatic.     Right Ear: External ear normal.     Left Ear: External ear normal.  Eyes:     General: No scleral icterus. Neck:     Thyroid : No thyromegaly.  Cardiovascular:     Rate and Rhythm: Normal rate and regular rhythm.     Heart sounds: Normal heart sounds. No murmur heard. Pulmonary:     Effort: Pulmonary effort is normal. No respiratory distress.     Breath sounds: Normal breath sounds. No wheezing.  Musculoskeletal:     Cervical back: Neck supple.  Skin:    General: Skin is warm and dry.  Neurological:     Mental Status: She is alert and oriented to person, place, and  time.  Psychiatric:        Mood and Affect: Mood normal.        Behavior: Behavior normal.        Thought Content: Thought content normal.        Judgment: Judgment normal.      BP 136/72 (BP Location: Right Arm, Patient Position: Sitting, Cuff Size: Small)   Pulse 93   Temp 98.9 F (37.2 C) (Oral)   Resp (!) 98   Ht 5' 5 (1.651 m)   Wt 183 lb 3.2 oz (83.1 kg)   HC 16 (40.6 cm)   BMI 30.49 kg/m  Wt Readings from Last 3 Encounters:  04/03/24 183 lb 3.2 oz (83.1 kg)  02/19/24 182 lb 12.8 oz (82.9 kg)  12/18/23 186 lb (84.4 kg)       Assessment & Plan:   Problem List Items Addressed This Visit       High   Tobacco abuse   Continues to smoke 1/2 PPD.  She tried the Chantix  and is still taking- not helping.  D/c chantix . Trial of wellbutrin  which has helped some in the past.         Unprioritized   Soft tissue mass   Pt reports resolution. Had US : 1.2 cm nodule c/w lipoma.       Overactive bladder   Finding relief with myrbetriq , continue same.       Low back pain   Used tramadol  that she had on hand from March which she found helpful.        Hyperlipidemia   Lab Results  Component Value Date   CHOL 189 08/17/2023   HDL 47.50 08/17/2023   LDLCALC 115 (H) 08/17/2023   LDLDIRECT  132.0 04/12/2021   TRIG 135.0 08/17/2023   CHOLHDL 4 08/17/2023   Update lipids today. Continue zetia /atorvastatin .       Relevant Medications   ezetimibe  (ZETIA ) 10 MG tablet   Other Relevant Orders   Comp Met (CMET)   Lipid panel   Gastroesophageal reflux disease   Stable on pepcid .       Essential hypertension   BP Readings from Last 3 Encounters:  04/03/24 136/72  02/19/24 136/84  12/18/23 135/80   BP stable without medication. Monitor.       Relevant Medications   ezetimibe  (ZETIA ) 10 MG tablet   Carotid aneurysm, right [facility])   Reviewed CTA with Dr. Donita when in was performed and he recommended medical management. Will refer back to neuro for annual  follow up.  Continue lipitor /zetia /plavix , smoking cessation efforts.   1. Intracranial CTA demonstrates stable small distal Right ICA aneurysm or infundibulum since 2017, mild for age ICA siphon atherosclerosis without significant stenosis. 2. Extracranial CTA positive for right > left Cervical ICA Fibromuscular Dysplasia (FMD), and up to moderate atherosclerotic stenosis of the left vertebral artery origin. 3. Stable and negative for age for age CT appearance of the brain. 4. Aortic Atherosclerosis (ICD10-I70.0) and Emphysema (ICD10-J43.9).      Relevant Medications   ezetimibe  (ZETIA ) 10 MG tablet   clopidogrel  (PLAVIX ) 75 MG tablet   Other Relevant Orders   Ambulatory referral to Neurology   CAD (coronary artery disease)   Had recent negative stress test with cardiology.       Relevant Medications   ezetimibe  (ZETIA ) 10 MG tablet   Other Visit Diagnoses       Digestive problems    -  Primary   Relevant Orders   Ambulatory referral to Gastroenterology       I have discontinued Jaime Harris's famotidine  and varenicline . I am also having her start on buPROPion . Additionally, I am having her maintain her multivitamin, APPLE CIDER VINEGAR PO, nitroGLYCERIN , cyclobenzaprine , atorvastatin , mirabegron  ER, ezetimibe , and clopidogrel .  Meds ordered this encounter  Medications   buPROPion  (WELLBUTRIN  SR) 150 MG 12 hr tablet    Sig: Take 1 tablet (150 mg total) by mouth 2 (two) times daily.    Dispense:  60 tablet    Refill:  2    Supervising Provider:   DOMENICA BLACKBIRD A [[id number]]   ezetimibe  (ZETIA ) 10 MG tablet    Sig: Take 1 tablet (10 mg total) by mouth daily.    Dispense:  90 tablet    Refill:  0    Supervising Provider:   DOMENICA BLACKBIRD A [[id number]]   clopidogrel  (PLAVIX ) 75 MG tablet    Sig: Take 1 tablet (75 mg total) by mouth daily.    Dispense:  90 tablet    Refill:  0    Supervising Provider:   DOMENICA BLACKBIRD A [[id number]]

## 2024-04-03 NOTE — Assessment & Plan Note (Signed)
Stable on pepcid

## 2024-04-03 NOTE — Patient Instructions (Signed)
 VISIT SUMMARY:  During your visit, we discussed your gastrointestinal discomfort, smoking cessation, and other health concerns. We reviewed your symptoms, past medical history, and current medications. We have outlined a plan to address each of your issues, including referrals, medication adjustments, and follow-up appointments.  YOUR PLAN:  HIATAL HERNIA: You have a small hiatal hernia that may be causing your symptoms of impaired digestion and sensation of food obstruction. -We will refer you to a gastrointestinal specialist for further evaluation.  CONSTIPATION: You are experiencing constipation that requires medication for relief. -Continue your current medication for constipation as needed.  EMPHYSEMA: You have emphysema likely due to smoking, which affects your breathing. -We will prescribe Wellbutrin  to be taken twice daily to help with smoking cessation. -We recommend starting a nicotine  patch at 14 mcg to assist with quitting smoking.  AORTIC CALCIFICATION: You have aortic calcification associated with aging and smoking. -No specific treatment is needed at this time, but we will monitor your condition.  CAROTID ARTERY STENOSIS: You have narrowing of the carotid artery as seen on your CT scan. -Continue taking Plavix  and atorvastatin  as advised by your neurologist. -We will refer you to a neurologist for an annual follow-up.  HYPERLIPIDEMIA: Your cholesterol levels are slightly above the target range. -We will update your cholesterol levels with a lab test.  RENAL CYST: You have a cyst on your left kidney that is likely benign. -No treatment is needed at this time.  HEPATITIS: You have ongoing hepatitis with fluctuating severity. -Discontinue the shampoo treatment as it has been ineffective.  LUMBAR SPINE PAIN: You have low back pain that was previously managed with tramadol  and muscle relaxants. -Continue your current pain management regimen as needed.  GASTROESOPHAGEAL  REFLUX DISEASE (GERD): You have GERD symptoms that were managed with Pepcid , and you report intermittent chest pain. -Consider resuming Pepcid  if symptoms persist.  GENERAL HEALTH MAINTENANCE: Your preventive care is up to date except for the shingles vaccination. -We recommend getting the shingles vaccination at your local pharmacy.  FOLLOW-UP: We need to monitor your progress and update your health status. -Schedule a follow-up appointment in three months. -Schedule a physical examination. -Order lab tests for cholesterol and kidney function before your next visit.

## 2024-04-03 NOTE — Assessment & Plan Note (Signed)
 Reviewed CTA with Dr. Donita when in was performed and he recommended medical management. Will refer back to neuro for annual follow up.  Continue lipitor /zetia /plavix , smoking cessation efforts.   1. Intracranial CTA demonstrates stable small distal Right ICA aneurysm or infundibulum since 2017, mild for age ICA siphon atherosclerosis without significant stenosis. 2. Extracranial CTA positive for right > left Cervical ICA Fibromuscular Dysplasia (FMD), and up to moderate atherosclerotic stenosis of the left vertebral artery origin. 3. Stable and negative for age for age CT appearance of the brain. 4. Aortic Atherosclerosis (ICD10-I70.0) and Emphysema (ICD10-J43.9).

## 2024-04-03 NOTE — Assessment & Plan Note (Signed)
 Used tramadol  that she had on hand from March which she found helpful.

## 2024-04-03 NOTE — Assessment & Plan Note (Signed)
 Pt reports resolution. Had US : 1.2 cm nodule c/w lipoma.

## 2024-04-03 NOTE — Assessment & Plan Note (Signed)
 Lab Results  Component Value Date   CHOL 189 08/17/2023   HDL 47.50 08/17/2023   LDLCALC 115 (H) 08/17/2023   LDLDIRECT 132.0 04/12/2021   TRIG 135.0 08/17/2023   CHOLHDL 4 08/17/2023   Update lipids today. Continue zetia /atorvastatin .

## 2024-04-03 NOTE — Assessment & Plan Note (Signed)
 Had recent negative stress test with cardiology.

## 2024-04-03 NOTE — Assessment & Plan Note (Signed)
 Finding relief with myrbetriq , continue same.

## 2024-04-03 NOTE — Assessment & Plan Note (Addendum)
 Continues to smoke 1/2 PPD.  She tried the Chantix  and is still taking- not helping.  D/c chantix . Trial of wellbutrin  which has helped some in the past.

## 2024-04-03 NOTE — Assessment & Plan Note (Signed)
 BP Readings from Last 3 Encounters:  04/03/24 136/72  02/19/24 136/84  12/18/23 135/80   BP stable without medication. Monitor.

## 2024-04-04 ENCOUNTER — Other Ambulatory Visit (INDEPENDENT_AMBULATORY_CARE_PROVIDER_SITE_OTHER)

## 2024-04-04 ENCOUNTER — Telehealth: Payer: Self-pay | Admitting: Family

## 2024-04-04 DIAGNOSIS — R748 Abnormal levels of other serum enzymes: Secondary | ICD-10-CM

## 2024-04-04 DIAGNOSIS — Z0279 Encounter for issue of other medical certificate: Secondary | ICD-10-CM

## 2024-04-04 LAB — COMPREHENSIVE METABOLIC PANEL WITH GFR
ALT: 17 U/L (ref 0–35)
AST: 16 U/L (ref 0–37)
Albumin: 4.3 g/dL (ref 3.5–5.2)
Alkaline Phosphatase: 142 U/L — ABNORMAL HIGH (ref 39–117)
BUN: 12 mg/dL (ref 6–23)
CO2: 28 meq/L (ref 19–32)
Calcium: 9.3 mg/dL (ref 8.4–10.5)
Chloride: 104 meq/L (ref 96–112)
Creatinine, Ser: 0.97 mg/dL (ref 0.40–1.20)
GFR: 60.24 mL/min (ref >60.00)
Glucose, Bld: 78 mg/dL (ref 70–99)
Potassium: 4.1 meq/L (ref 3.5–5.1)
Sodium: 142 meq/L (ref 135–145)
Total Bilirubin: 0.4 mg/dL (ref 0.2–1.2)
Total Protein: 6.9 g/dL (ref 6.0–8.3)

## 2024-04-04 LAB — LIPID PANEL
Cholesterol: 103 mg/dL (ref 0–200)
HDL: 46.4 mg/dL (ref >39.00)
LDL Cholesterol: 39 mg/dL (ref 0–99)
NonHDL: 56.16
Total CHOL/HDL Ratio: 2
Triglycerides: 85 mg/dL (ref 0.0–149.0)
VLDL: 17 mg/dL (ref 0.0–40.0)

## 2024-04-04 NOTE — Telephone Encounter (Signed)
 Cholesterol looks great.  Alk phos (one of the liver tests) has been chronically a little elevated. I would recommend that we bring her back for some additional testing to try to determine why her level is elevated.

## 2024-04-04 NOTE — Addendum Note (Signed)
 Addended by: TRUDY CURVIN RAMAN on: 04/04/2024 01:15 PM   Modules accepted: Orders

## 2024-04-04 NOTE — Telephone Encounter (Signed)
 Patient notified of results and scheduled to come in today for additional testing.

## 2024-04-07 LAB — ALKALINE PHOSPHATASE, ISOENZYMES
Alkaline Phosphatase: 160 IU/L — ABNORMAL HIGH (ref 44–121)
BONE FRACTION: 23 (ref 14–68)
INTESTINAL FRAC.: 9 (ref 0–18)
LIVER FRACTION: 68 (ref 18–85)

## 2024-04-08 ENCOUNTER — Other Ambulatory Visit: Payer: Self-pay | Admitting: Family

## 2024-04-14 ENCOUNTER — Other Ambulatory Visit: Payer: Self-pay | Admitting: Family

## 2024-04-14 ENCOUNTER — Encounter: Payer: Self-pay | Admitting: Physician Assistant

## 2024-04-14 ENCOUNTER — Ambulatory Visit (INDEPENDENT_AMBULATORY_CARE_PROVIDER_SITE_OTHER): Admitting: Physician Assistant

## 2024-04-14 VITALS — BP 142/72 | HR 80 | Ht 65.0 in | Wt 181.1 lb

## 2024-04-14 DIAGNOSIS — R1310 Dysphagia, unspecified: Secondary | ICD-10-CM | POA: Diagnosis not present

## 2024-04-14 DIAGNOSIS — K5900 Constipation, unspecified: Secondary | ICD-10-CM | POA: Diagnosis not present

## 2024-04-14 DIAGNOSIS — Z8719 Personal history of other diseases of the digestive system: Secondary | ICD-10-CM

## 2024-04-14 DIAGNOSIS — Z7901 Long term (current) use of anticoagulants: Secondary | ICD-10-CM

## 2024-04-14 DIAGNOSIS — Z860101 Personal history of adenomatous and serrated colon polyps: Secondary | ICD-10-CM

## 2024-04-14 DIAGNOSIS — R748 Abnormal levels of other serum enzymes: Secondary | ICD-10-CM | POA: Diagnosis not present

## 2024-04-14 DIAGNOSIS — Z720 Tobacco use: Secondary | ICD-10-CM

## 2024-04-14 MED ORDER — PANTOPRAZOLE SODIUM 40 MG PO TBEC: 40.0000 mg | DELAYED_RELEASE_TABLET | Freq: Every day | ORAL | 5 refills | Status: DC

## 2024-04-14 NOTE — Progress Notes (Signed)
 Chief Complaint: Dysphagia, Constipation  HPI:    Jaime Harris is a  67 y/o AA female with a past medical history as listed below inclduing CAD, previous MI on Plavix  (02/26/2024 myo perfusion study/stress test showed EF 62%), vitamin D  deficiency and multiple others, known to Dr. Wilhelmenia, who was referred to me by Daryl Setter, NP for a complaint of dysphagia and constipation.     10/27/2022 colonoscopy done for personal history of adenomatous polyps more than 5 years ago.  That showed hemorrhoids, redundant colon, 2 2-4 mm polyps in the sigmoid colon and hepatic flexure.  Diverticulosis in the rectosigmoid colon, sigmoid colon and ascending colon.  Nonbleeding nonthrombosed internal hemorrhoids.  Pathology showed adenomatous polyps and repeat recommended in 7 years.    05/01/2023 CT of the chest showed a tiny hiatal hernia.    09/29/2023 ultrasound of the neck showed a lipoma 1.2 cm.    01/08/2024 CT angio head and neck with and without contrast showed chronic lung disease with evidence of emphysema and possible early subpleural fibrosis.    03/30/2024 CMP with elevated alk phos at 142 consistent with patient's baseline.  Other LFTs were normal.    03/30/2024 patient saw PCP and discussed gastrointestinal discomfort and food not digesting properly.  I discussed the CT scan with tiny hiatal hernia.  Also discussed history of smoking.  Also experiencing constipation.  Discussed history of low back pain previously managed with Tramadol  and Flexeril .  At that time patient referred to us  for dysphagia and constipation.    Today, the patient presents to clinic and describes that she was previously on Pepcid  but this did not seem to help at all with her dysphagia issues so she discontinued it.  Tells me over the past couple of weeks at least she has been having trouble when she eats feeling like solid food gets stuck at the base of her throat.  Oftentimes this hinders her from eating her next meal because  she still feels like things are there.  Very occasionally if she eats she will eat something it does not go all the way down and comes just back out.  No trouble with liquids.  Denies any abdominal pain or heartburn/reflux.     Also discusses that over the past month or so she has had issues with going to the bathroom, she can go at least a week without a bowel movement at all, still passes gas in this timeframe and then uses milk of magnesia in order to have a bowel movement.  She has not tried anything else over-the-counter.    Denies fever, chills, weight loss or blood in her stool.  Past Medical History:  Diagnosis Date   Aneurysm (HCC) 2020   supraclinoid R int carotid artery   CAD (coronary artery disease)    LHC 01/03/2022: 30% of pRCA followed by 80% of mRCA s/p DES, 20% of pLAD, &30% of p-mLCX   Chronic tension headaches    Headache    Hyperlipidemia    borderline   Non-ST elevation (NSTEMI) myocardial infarction (HCC) 01/03/2022   Sickle cell trait (HCC)    Thyroid  disease    enlarged thyroid    Vertigo    Vitamin D  deficiency     Past Surgical History:  Procedure Laterality Date   COLONOSCOPY  2016   COLONOSCOPY WITH PROPOFOL   10/27/2022   CORONARY PRESSURE/FFR STUDY N/A 01/03/2022   Procedure: INTRAVASCULAR PRESSURE WIRE/FFR STUDY;  Surgeon: Verlin Lonni BIRCH, MD;  [facility]: [facility] INVASIVE  CV LAB;  Service: Cardiovascular;  Laterality: N/A;   CORONARY STENT INTERVENTION N/A 01/03/2022   Procedure: CORONARY STENT INTERVENTION;  Surgeon: Verlin Lonni BIRCH, MD;  Location: [facility] INVASIVE CV LAB;  Service: Cardiovascular;  Laterality: N/A;   FOOT SURGERY Bilateral    Pt states she had bones removed from the sides of her feet and middle toes so she could walk better.   LEFT HEART CATH AND CORONARY ANGIOGRAPHY N/A 01/03/2022   Procedure: LEFT HEART CATH AND CORONARY ANGIOGRAPHY;  Surgeon: Verlin Lonni BIRCH, MD;  Location: [facility] INVASIVE CV LAB;  Service: Cardiovascular;   Laterality: N/A;   TONSILLECTOMY  09/11/1973   TUBAL LIGATION      Current Outpatient Medications  Medication Sig Dispense Refill   APPLE CIDER VINEGAR PO Take 1 capsule by mouth daily.     atorvastatin  (LIPITOR ) 80 MG tablet Take 1 tablet (80 mg total) by mouth daily. 90 tablet 3   buPROPion  (WELLBUTRIN  SR) 150 MG 12 hr tablet Take 1 tablet (150 mg total) by mouth 2 (two) times daily. 60 tablet 2   clopidogrel  (PLAVIX ) 75 MG tablet Take 1 tablet (75 mg total) by mouth daily. 90 tablet 0   cyclobenzaprine  (FLEXERIL ) 5 MG tablet Take 1 tablet (5 mg total) by mouth 3 (three) times daily as needed for muscle spasms. 30 tablet 0   ezetimibe  (ZETIA ) 10 MG tablet Take 1 tablet (10 mg total) by mouth daily. 90 tablet 0   gabapentin  (NEURONTIN ) 300 MG capsule TAKE 1 CAPSULE BY MOUTH EVERYDAY AT BEDTIME 90 capsule 1   mirabegron  ER (MYRBETRIQ ) 25 MG TB24 tablet Take 1 tablet (25 mg total) by mouth daily. 30 tablet 5   Multiple Vitamin (MULTIVITAMIN) tablet Take 1 tablet by mouth daily.     nitroGLYCERIN  (NITROSTAT ) 0.4 MG SL tablet Place 1 tablet (0.4 mg total) under the tongue every 5 (five) minutes as needed for chest pain. 12 tablet 0   No current facility-administered medications for this visit.    Allergies as of 04/14/2024   (No Known Allergies)    Family History  Problem Relation Age of Onset   Heart disease Mother        pacemaker/defibrillator   Diabetes Mother    Hypertension Sister    Diabetes Brother    Heart disease Paternal Grandmother        died of CHF   Diabetes Paternal Grandmother    Cancer Neg Hx    Kidney disease Neg Hx    Colon cancer Neg Hx    Colon polyps Neg Hx    Rectal cancer Neg Hx    Stomach cancer Neg Hx    Esophageal cancer Neg Hx     Social History   Socioeconomic History   Marital status: Single    Spouse name: Not on file   Number of children: 3   Years of education: Not on file   Highest education level: GED or equivalent  Occupational  History    Comment: Conservation officer, nature  Tobacco Use   Smoking status: Every Day    Current packs/day: 0.50    Types: Cigarettes    Passive exposure: Never   Smokeless tobacco: Never  Vaping Use   Vaping status: Never Used  Substance and Sexual Activity   Alcohol use: Yes    Comment: occasional   Drug use: No   Sexual activity: Not Currently    Partners: Male    Birth control/protection: Post-menopausal  Other Topics Concern   Not on  file  Social History Narrative   Makes eye glasses.     Single   GED   She has 3 children   64- Son Francis- lives locally- he has one son   74- Daughter Freddy- 7 children (4 of these children live with the pt- she is a primary care giver)   1985- michael- lives in [place] 1 daughter   Enjoys reading   Caffeine- coffee, 4 cups daily, soda every other day   Social Drivers of Health   Financial Resource Strain: Low Risk  (04/03/2024)   Overall Financial Resource Strain (CARDIA)    Difficulty of Paying Living Expenses: Not very hard  Food Insecurity: Food Insecurity Present (04/03/2024)   Hunger Vital Sign    Worried About Running Out of Food in the Last Year: Never true    Ran Out of Food in the Last Year: Sometimes true  Transportation Needs: No Transportation Needs (04/03/2024)   PRAPARE - Administrator, Civil Service (Medical): No    Lack of Transportation (Non-Medical): No  Physical Activity: Inactive (04/03/2024)   Exercise Vital Sign    Days of Exercise per Week: 0 days    Minutes of Exercise per Session: Not on file  Stress: No Stress Concern Present (04/03/2024)   [employer] of Occupational Health - Occupational Stress Questionnaire    Feeling of Stress: Only a little  Social Connections: Moderately Isolated (04/03/2024)   Social Connection and Isolation Panel    Frequency of Communication with Friends and Family: More than three times a week    Frequency of Social Gatherings with Friends and Family: More than three times  a week    Attends Religious Services: More than 4 times per year    Active Member of [employer] or Organizations: No    Attends Engineer, structural: Not on file    Marital Status: Divorced  Catering manager Violence: Not on file    Review of Systems:    Constitutional: No weight loss, fever or chills Cardiovascular: No chest pain  Respiratory: No SOB  Gastrointestinal: See HPI and otherwise negative   Physical Exam:  Vital signs: BP (!) 142/72 (BP Location: Right Arm, Patient Position: Sitting, Cuff Size: Normal)   Pulse 80   Ht 5' 5 (1.651 m)   Wt 181 lb 2 oz (82.2 kg)   BMI 30.14 kg/m    Constitutional:   Pleasant AA female appears to be in NAD, Well developed, Well nourished, alert and cooperative Throat: Oral cavity and pharynx without inflammation, swelling or lesion.  Respiratory: Respirations even and unlabored. Lungs clear to auscultation bilaterally.   No wheezes, crackles, or rhonchi.  Cardiovascular: Normal S1, S2. No MRG. Regular rate and rhythm. No peripheral edema, cyanosis or pallor.  Gastrointestinal:  Soft, nondistended, nontender. No rebound or guarding. Decreased BS all four quadrants. No appreciable masses or hepatomegaly. Rectal:  Not performed.  Psychiatric: Demonstrates good judgement and reason without abnormal affect or behaviors.  MOST RECENT LABS: CBC    Component Value Date/Time   WBC 9.4 01/04/2022 0409   RBC 4.30 01/04/2022 0409   HGB 12.6 01/04/2022 0409   HCT 36.5 01/04/2022 0409   PLT 179 01/04/2022 0409   MCV 84.9 01/04/2022 0409   MCH 29.3 01/04/2022 0409   MCHC 34.5 01/04/2022 0409   RDW 14.6 01/04/2022 0409   LYMPHSABS 2.0 01/03/2022 1015   MONOABS 0.3 01/03/2022 1015   EOSABS 0.0 01/03/2022 1015   BASOSABS 0.0 01/03/2022 1015  CMP     Component Value Date/Time   NA 142 04/03/2024 1340   NA 144 01/11/2022 0917   K 4.1 04/03/2024 1340   CL 104 04/03/2024 1340   CO2 28 04/03/2024 1340   GLUCOSE 78 04/03/2024 1340    BUN 12 04/03/2024 1340   BUN 7 (L) 01/11/2022 0917   CREATININE 0.97 04/03/2024 1340   CREATININE 1.10 (H) 04/14/2022 1521   CALCIUM  9.3 04/03/2024 1340   PROT 6.9 04/03/2024 1340   PROT 7.0 08/15/2022 0824   ALBUMIN 4.3 04/03/2024 1340   ALBUMIN 4.5 08/15/2022 0824   AST 16 04/03/2024 1340   ALT 17 04/03/2024 1340   ALKPHOS 160 (H) 04/04/2024 1315   BILITOT 0.4 04/03/2024 1340   BILITOT 0.2 08/15/2022 0824   GFRNONAA >60 01/04/2022 0409   GFRAA >60 10/10/2017 1600    Assessment: 1.  Dysphagia: Trouble over the past couple of weeks per patient feeling like food gets stuck at the end of her throat, CT of the chest in 2024 with tiny hiatal hernia which I do not think is contributing, no overt heartburn or reflux symptoms, currently not on acid suppression, no prior EGD; consider esophageal stricture versus dysmotility 2.  Constipation: Reviewed recent colonoscopy in 2024 with 2 adenomatous polyps, diverticulosis and internal hemorrhoids, patient with 1 bowel movement a week over the past month, no changes in medication; consider relation to diet +/- Age +/- slow transit 3.  History of MI on Plavix  4.  Elevated alk phos: Seems consistent over the past 3 years, AST/ALT normal, recent fractionation by PCP showed liver fraction 68%, bone fraction 23% intestinal fraction 9%, no prior dedicated liver imaging, no prior workup; consider autoimmune work up  Plan: 1.  Scheduled patient for a barium esophagram tablet for further evaluation of dysphagia. 2.  Started the patient on Pantoprazole  40 mg every morning, 30-60 minutes before breakfast #30 with 5 refills 3.  Reviewed antireflux and anti-dysphagia measures. 4.  Recommend the patient start MiraLAX on a daily basis.  Discussed titration up to 4 times a day if needed. 5.  Patient also has persistently elevated alk phos which will likely require dedicated liver imaging in the future.  We can discuss this at follow-up. 6.  Patient to follow in  clinic with me per recommendations after barium esophagram.  Jaime Failing, PA-C [facility] 04/14/2024, 8:35 AM  Cc: Daryl Setter, NP

## 2024-04-14 NOTE — Patient Instructions (Signed)
 We have sent the following medications to your pharmacy for you to pick up at your convenience: Pantoprazole  40 mg daily 30-60 minutes before breakfast.   Start Miralax 1 capful daily in 8 ounces of liquid.  You have been scheduled for a Barium Esophogram at [facility] (1st floor of the hospital) on Wednesday 04/30/24 at 9 am. Please arrive 30 minutes prior to your appointment for registration. Make certain not to have anything to eat or drink 3 hours prior to your test. If you need to reschedule for any reason, please contact radiology at [phone] to do so. __________________________________________________________________ A barium swallow is an examination that concentrates on views of the esophagus. This tends to be a double contrast exam (barium and two liquids which, when combined, create a gas to distend the wall of the oesophagus) or single contrast (non-ionic iodine based). The study is usually tailored to your symptoms so a good history is essential. Attention is paid during the study to the form, structure and configuration of the esophagus, looking for functional disorders (such as aspiration, dysphagia, achalasia, motility and reflux) EXAMINATION You may be asked to change into a gown, depending on the type of swallow being performed. A radiologist and radiographer will perform the procedure. The radiologist will advise you of the type of contrast selected for your procedure and direct you during the exam. You will be asked to stand, sit or lie in several different positions and to hold a small amount of fluid in your mouth before being asked to swallow while the imaging is performed .In some instances you may be asked to swallow barium coated marshmallows to assess the motility of a solid food bolus. The exam can be recorded as a digital or video fluoroscopy procedure. POST PROCEDURE It will take 1-2 days for the barium to pass through your system. To facilitate this, it is  important, unless otherwise directed, to increase your fluids for the next 24-48hrs and to resume your normal diet.  This test typically takes about 30 minutes to perform. __________________________________________________________________________________

## 2024-04-15 NOTE — Progress Notes (Signed)
 Attending Physician's Attestation   I have reviewed the chart.   I agree with the Advanced Practitioner's note, impression, and recommendations with any updates as below. Very well put together plan of action will be step-by-step.  Agree endoscopy may be required, but can start with barium swallow and with PPI initiation.  Further workup of LFT alk phos elevation will be ongoing, and likely reasonable to at least get abdominal ultrasound to evaluate the liver and spleen size.SABRA Aloha Finner, MD [facility] Advanced Endoscopy Office # [phone]

## 2024-04-17 ENCOUNTER — Telehealth: Payer: Self-pay

## 2024-04-17 DIAGNOSIS — R748 Abnormal levels of other serum enzymes: Secondary | ICD-10-CM

## 2024-04-17 NOTE — Telephone Encounter (Signed)
 Spoke with patient. She is in agreement to order a RUQ US . Orders placed. Message sent to radiology schedulers.

## 2024-04-17 NOTE — Telephone Encounter (Signed)
-----   Message from Delon Hendricks Failing sent at 04/17/2024  9:50 AM EDT ----- Regarding: Ultrasound Can you please call the patient and let her know that due to her elevated alk phos would like to get dedicated imaging of her liver including right upper quadrant ultrasound.  She is willing to proceed with this we can order it now otherwise we can discuss further at follow-up.  Thanks, JL L ----- Message ----- From: Wilhelmenia Aloha Raddle., MD Sent: 04/15/2024   4:48 PM EDT To: Delon Hendricks Failing, PA

## 2024-04-22 ENCOUNTER — Telehealth: Payer: Self-pay | Admitting: Family

## 2024-04-22 DIAGNOSIS — R748 Abnormal levels of other serum enzymes: Secondary | ICD-10-CM

## 2024-04-22 NOTE — Telephone Encounter (Signed)
-----   Message from Harlene Horton sent at 04/21/2024  2:07 PM EDT ----- I would refer to GI first I never am quite sure what to do with these but that is the next step ----- Message ----- From: Daryl Setter, NP Sent: 04/10/2024  10:24 AM EDT To: Harlene DELENA Horton, MD  What are your thoughts on her elevated Alk Phos?  It looks to be predominately liver.  Would you refer to GI? Any other work up you would consider?  Ginna Schuur

## 2024-04-23 ENCOUNTER — Ambulatory Visit (HOSPITAL_COMMUNITY)
Admission: RE | Admit: 2024-04-23 | Discharge: 2024-04-23 | Disposition: A | Discharge status: Home / Self Care | Source: Ambulatory Visit | Attending: Physician Assistant | Admitting: Physician Assistant

## 2024-04-23 DIAGNOSIS — R748 Abnormal levels of other serum enzymes: Secondary | ICD-10-CM | POA: Diagnosis not present

## 2024-04-23 DIAGNOSIS — K769 Liver disease, unspecified: Secondary | ICD-10-CM | POA: Diagnosis not present

## 2024-04-23 NOTE — Addendum Note (Signed)
 Addended by: DARYL SETTER on: 04/23/2024 08:44 AM   Modules accepted: Orders

## 2024-04-29 ENCOUNTER — Other Ambulatory Visit: Payer: Self-pay | Admitting: Family

## 2024-04-30 ENCOUNTER — Other Ambulatory Visit: Payer: Self-pay | Admitting: Physician Assistant

## 2024-04-30 ENCOUNTER — Ambulatory Visit (HOSPITAL_COMMUNITY)
Admission: RE | Admit: 2024-04-30 | Discharge: 2024-04-30 | Disposition: A | Discharge status: Home / Self Care | Source: Ambulatory Visit | Attending: Physician Assistant | Admitting: Physician Assistant

## 2024-04-30 DIAGNOSIS — R1310 Dysphagia, unspecified: Secondary | ICD-10-CM | POA: Insufficient documentation

## 2024-04-30 DIAGNOSIS — K449 Diaphragmatic hernia without obstruction or gangrene: Secondary | ICD-10-CM | POA: Diagnosis not present

## 2024-04-30 DIAGNOSIS — R748 Abnormal levels of other serum enzymes: Secondary | ICD-10-CM

## 2024-04-30 DIAGNOSIS — K224 Dyskinesia of esophagus: Secondary | ICD-10-CM | POA: Diagnosis not present

## 2024-04-30 DIAGNOSIS — Z4682 Encounter for fitting and adjustment of non-vascular catheter: Secondary | ICD-10-CM | POA: Diagnosis not present

## 2024-04-30 DIAGNOSIS — K5900 Constipation, unspecified: Secondary | ICD-10-CM

## 2024-04-30 DIAGNOSIS — Z7901 Long term (current) use of anticoagulants: Secondary | ICD-10-CM

## 2024-05-01 ENCOUNTER — Ambulatory Visit: Payer: Self-pay | Admitting: Physician Assistant

## 2024-05-01 ENCOUNTER — Telehealth: Payer: Self-pay | Admitting: Neurology

## 2024-05-01 ENCOUNTER — Other Ambulatory Visit: Payer: Self-pay

## 2024-05-01 ENCOUNTER — Telehealth: Payer: Self-pay

## 2024-05-01 DIAGNOSIS — R1310 Dysphagia, unspecified: Secondary | ICD-10-CM

## 2024-05-01 DIAGNOSIS — R748 Abnormal levels of other serum enzymes: Secondary | ICD-10-CM

## 2024-05-01 NOTE — Telephone Encounter (Signed)
 Copied from CRM #[id number]. Topic: Clinical - Lab/Test Results >> May 01, 2024  2:40 PM [place] E wrote: Reason for CRM: Patient returning a missed call, would like to discuss her barium swallow study results. Callback number [phone].

## 2024-05-01 NOTE — Telephone Encounter (Signed)
 [facility] Pre-operative Risk Assessment     Request for surgical clearance:     Endoscopy Procedure  What type of surgery is being performed?     EGD  When is this surgery scheduled?     05/27/24  What type of clearance is required ?   Pharmacy  Are there any medications that need to be held prior to surgery and how long? Plavix  to be held 5 days prior  Practice name and name of physician performing surgery?      [facility] with Dr Wilhelmenia  What is your office phone and fax number?      Phone- [phone]  Fax- [phone]  Anesthesia type (None, local, MAC, general) ?       MAC   Please route your response to Almarie Goo, LPN

## 2024-05-02 ENCOUNTER — Telehealth: Payer: Self-pay | Admitting: *Deleted

## 2024-05-02 ENCOUNTER — Telehealth: Payer: Self-pay

## 2024-05-02 NOTE — Telephone Encounter (Signed)
   Name: Jaime Harris  DOB: [birth year]  MRN: [id number]  Primary Cardiologist: Arun K Thukkani, MD   Preoperative team, please contact this patient and set up a phone call appointment for further preoperative risk assessment. Please obtain consent and complete medication review. Thank you for your help. Last seen by Freeman Alberts, NP on 02/19/2024. Surgery is scheduled for 05/27/2024. Needs sooner than later appt.   I confirm that guidance regarding antiplatelet and oral anticoagulation therapy has been completed and, if necessary, noted below.  Per office protocol, if patient is without any new symptoms or concerns at the time of their virtual visit, he/she may hold Plavix  for 5 days prior to procedure. Please resume Plavix  as soon as possible postprocedure, at the discretion of the surgeon.    I also confirmed the patient resides in the state of [place] . As per [facility] Medical Board telemedicine laws, the patient must reside in the state in which the provider is licensed.   Lamarr Satterfield, NP 05/02/2024, 8:22 AM [facility]

## 2024-05-02 NOTE — Telephone Encounter (Signed)
 Copied from CRM #[id number]. Topic: Clinical - Lab/Test Results >> May 01, 2024  2:40 PM [place] E wrote: Reason for CRM: Patient returning a missed call, would like to discuss her barium swallow study results. Callback number [phone]. >> May 02, 2024 10:03 AM Terri MATSU wrote: Patient is calling back because she stated she missed a call from a nurse and she needs more info for her FMLA

## 2024-05-02 NOTE — Telephone Encounter (Signed)
 Called a few times but no answer, left message for patient to be aware she needs to get this results from the GI provider.

## 2024-05-02 NOTE — Telephone Encounter (Signed)
 Pt has been scheduled tele preop appt 05/07/24. Med rec and consent are done.     Patient Consent for Virtual Visit        Jaime Harris has provided verbal consent on 05/02/2024 for a virtual visit (video or telephone).   CONSENT FOR VIRTUAL VISIT FOR:  Jaime Harris  By participating in this virtual visit I agree to the following:  I hereby voluntarily request, consent and authorize [facility] and its employed or contracted physicians, physician assistants, nurse practitioners or other licensed health care professionals (the Practitioner), to provide me with telemedicine health care services (the "Services) as deemed necessary by the treating Practitioner. I acknowledge and consent to receive the Services by the Practitioner via telemedicine. I understand that the telemedicine visit will involve communicating with the Practitioner through live audiovisual communication technology and the disclosure of certain medical information by electronic transmission. I acknowledge that I have been given the opportunity to request an in-person assessment or other available alternative prior to the telemedicine visit and am voluntarily participating in the telemedicine visit.  I understand that I have the right to withhold or withdraw my consent to the use of telemedicine in the course of my care at any time, without affecting my right to future care or treatment, and that the Practitioner or I may terminate the telemedicine visit at any time. I understand that I have the right to inspect all information obtained and/or recorded in the course of the telemedicine visit and may receive copies of available information for a reasonable fee.  I understand that some of the potential risks of receiving the Services via telemedicine include:  Delay or interruption in medical evaluation due to technological equipment failure or disruption; Information transmitted may not be sufficient (e.g. poor  resolution of images) to allow for appropriate medical decision making by the Practitioner; and/or  In rare instances, security protocols could fail, causing a breach of personal health information.  Furthermore, I acknowledge that it is my responsibility to provide information about my medical history, conditions and care that is complete and accurate to the best of my ability. I acknowledge that Practitioner's advice, recommendations, and/or decision may be based on factors not within their control, such as incomplete or inaccurate data provided by me or distortions of diagnostic images or specimens that may result from electronic transmissions. I understand that the practice of medicine is not an exact science and that Practitioner makes no warranties or guarantees regarding treatment outcomes. I acknowledge that a copy of this consent can be made available to me via my patient portal [facility] MyChart), or I can request a printed copy by calling the office of [facility].    I understand that my insurance will be billed for this visit.   I have read or had this consent read to me. I understand the contents of this consent, which adequately explains the benefits and risks of the Services being provided via telemedicine.  I have been provided ample opportunity to ask questions regarding this consent and the Services and have had my questions answered to my satisfaction. I give my informed consent for the services to be provided through the use of telemedicine in my medical care

## 2024-05-02 NOTE — Telephone Encounter (Signed)
 Pt has been scheduled tele preop appt 05/07/24. Med rec and consent are done.

## 2024-05-05 ENCOUNTER — Ambulatory Visit (HOSPITAL_BASED_OUTPATIENT_CLINIC_OR_DEPARTMENT_OTHER)
Admission: RE | Admit: 2024-05-05 | Discharge: 2024-05-05 | Disposition: A | Discharge status: Home / Self Care | Source: Ambulatory Visit | Attending: Family | Admitting: Family

## 2024-05-05 DIAGNOSIS — F17210 Nicotine dependence, cigarettes, uncomplicated: Secondary | ICD-10-CM | POA: Diagnosis not present

## 2024-05-05 DIAGNOSIS — Z720 Tobacco use: Secondary | ICD-10-CM | POA: Diagnosis not present

## 2024-05-06 ENCOUNTER — Ambulatory Visit: Payer: Self-pay | Admitting: Physician Assistant

## 2024-05-06 NOTE — Progress Notes (Signed)
 Your ultrasound showed signs of fatty liver.  Otherwise normal.  Delon Failing, PA-C

## 2024-05-07 ENCOUNTER — Ambulatory Visit: Attending: Cardiovascular Disease

## 2024-05-07 DIAGNOSIS — Z01810 Encounter for preprocedural cardiovascular examination: Secondary | ICD-10-CM | POA: Diagnosis not present

## 2024-05-07 NOTE — Progress Notes (Signed)
 Virtual Visit via Telephone Note   Because of Jaime Harris co-morbid illnesses, she is at least at moderate risk for complications without adequate follow up.  This format is felt to be most appropriate for this patient at this time.  Due to technical limitations with video connection (technology), today's appointment will be conducted as an audio only telehealth visit, and [name] verbally agreed to proceed in this manner.   All issues noted in this document were discussed and addressed.  No physical exam could be performed with this format.  Evaluation Performed:  Preoperative cardiovascular risk assessment _____________   Date:  05/07/2024   Patient ID:  Jaime Harris, DOB [birth year], MRN [id number] Patient Location:  Home Provider location:   Office  Primary Care Provider:  Daryl Setter, NP Primary Cardiologist:  Arun K Thukkani, MD  Chief Complaint / Patient Profile   68 y.o. y/o female with a h/o CAD s/p NSTEMI 12/2021 with DES to mid RCA, HLD, right supraclinoid ICA 3.5 cms, enlarged thyroid , family history of CAD tobacco abuse, sickle cell trait, aortic atherosclerosis  who is pending upper endoscopy and presents today for telephonic preoperative cardiovascular risk assessment.  History of Present Illness    Jaime Harris is a 67 y.o. female who presents via audio/video conferencing for a telehealth visit today.  Pt was last seen in cardiology clinic on 02/19/2024 by Jackee Alberts, NP. At that time NOREEN MACKINTOSH was complaining of spontaneous sharp pain and underwent Lexiscan  Myoview  that was low risk and showed no evidence of ischemia.  The patient is now pending procedure as outlined above. Since her last visit, she has been doing well with no new cardiac complaints.  She denies chest pain, shortness of breath, lower extremity edema, fatigue, palpitations, melena, hematuria, hemoptysis, diaphoresis, weakness, presyncope, syncope, orthopnea, and PND.    Past Medical History    Past Medical History:  Diagnosis Date   Aneurysm (HCC) 2020   supraclinoid R int carotid artery   CAD (coronary artery disease)    LHC 01/03/2022: 30% of pRCA followed by 80% of mRCA s/p DES, 20% of pLAD, &30% of p-mLCX   Chronic tension headaches    Headache    Hyperlipidemia    borderline   Non-ST elevation (NSTEMI) myocardial infarction (HCC) 01/03/2022   Sickle cell trait (HCC)    Thyroid  disease    enlarged thyroid    Vertigo    Vitamin D  deficiency    Past Surgical History:  Procedure Laterality Date   COLONOSCOPY  2016   COLONOSCOPY WITH PROPOFOL   10/27/2022   CORONARY PRESSURE/FFR STUDY N/A 01/03/2022   Procedure: INTRAVASCULAR PRESSURE WIRE/FFR STUDY;  Surgeon: Verlin Lonni BIRCH, MD;  Location: [facility];  Service: Cardiovascular;  Laterality: N/A;   CORONARY STENT INTERVENTION N/A 01/03/2022   Procedure: CORONARY STENT INTERVENTION;  Surgeon: Verlin Lonni BIRCH, MD;  Location: [facility];  Service: Cardiovascular;  Laterality: N/A;   FOOT SURGERY Bilateral    Pt states she had bones removed from the sides of her feet and middle toes so she could walk better.   LEFT HEART CATH AND CORONARY ANGIOGRAPHY N/A 01/03/2022   Procedure: LEFT HEART CATH AND CORONARY ANGIOGRAPHY;  Surgeon: Verlin Lonni BIRCH, MD;  Location: [facility];  Service: Cardiovascular;  Laterality: N/A;   TONSILLECTOMY  09/11/1973   TUBAL LIGATION      Allergies  No Known Allergies  Home Medications    Prior to Admission  medications   Medication Sig Start Date End Date Taking? Authorizing Provider  APPLE CIDER VINEGAR PO Take 1 capsule by mouth daily.    [provider]  atorvastatin  (LIPITOR ) 80 MG tablet Take 1 tablet (80 mg total) by mouth daily. 02/19/24   Wyn Jackee VEAR Mickey., NP  buPROPion  (WELLBUTRIN  SR) 150 MG 12 hr tablet Take 1 tablet (150 mg total) by mouth 2 (two) times daily. 04/29/24   O'Sullivan, Melissa, NP   clopidogrel  (PLAVIX ) 75 MG tablet Take 1 tablet (75 mg total) by mouth daily. 04/03/24   O'Sullivan, Melissa, NP  cyclobenzaprine  (FLEXERIL ) 5 MG tablet Take 1 tablet (5 mg total) by mouth 3 (three) times daily as needed for muscle spasms. 09/19/23   Daryl Setter, NP  ezetimibe  (ZETIA ) 10 MG tablet Take 1 tablet (10 mg total) by mouth daily. 04/03/24   O'Sullivan, Melissa, NP  gabapentin  (NEURONTIN ) 300 MG capsule TAKE 1 CAPSULE BY MOUTH EVERYDAY AT BEDTIME 04/08/24   O'Sullivan, Melissa, NP  mirabegron  ER (MYRBETRIQ ) 25 MG TB24 tablet Take 1 tablet (25 mg total) by mouth daily. 03/07/24   O'Sullivan, Melissa, NP  Multiple Vitamin (MULTIVITAMIN) tablet Take 1 tablet by mouth daily.    [provider]  nitroGLYCERIN  (NITROSTAT ) 0.4 MG SL tablet Place 1 tablet (0.4 mg total) under the tongue every 5 (five) minutes as needed for chest pain. 07/27/23   Thukkani, Arun K, MD  pantoprazole  (PROTONIX ) 40 MG tablet Take 1 tablet (40 mg total) by mouth daily. 04/14/24   Beather Delon Gibson, PA    Physical Exam    Vital Signs:  NISHITA ISAACKS does not have vital signs available for review today  Given telephonic nature of communication, physical exam is limited. AAOx3. NAD. Normal affect.  Speech and respirations are unlabored.  Accessory Clinical Findings    None  Assessment & Plan    1.  Preoperative Cardiovascular Risk Assessment: - Patient's RCRI score is 0.9%  The patient affirms she has been doing well without any new cardiac symptoms. They are able to achieve 7 METS without cardiac limitations. Therefore, based on ACC/AHA guidelines, the patient would be at acceptable risk for the planned procedure without further cardiovascular testing. The patient was advised that if she develops new symptoms prior to surgery to contact our office to arrange for a follow-up visit, and she verbalized understanding.   The patient was advised that if she develops new symptoms prior to surgery to  contact our office to arrange for a follow-up visit, and she verbalized understanding.  Patient can hold Plavix  5 days prior to procedure and should restart postprocedure when surgically safe and hemostasis is achieved. A copy of this note will be routed to requesting surgeon.  Time:   Today, I have spent 6 minutes with the patient with telehealth technology discussing medical history, symptoms, and management plan.     Wyn Mickey, Jackee Shove, NP  05/07/2024, 7:46 AM

## 2024-05-19 ENCOUNTER — Telehealth: Payer: Self-pay

## 2024-05-19 ENCOUNTER — Ambulatory Visit: Payer: Self-pay | Admitting: Family

## 2024-05-19 DIAGNOSIS — R911 Solitary pulmonary nodule: Secondary | ICD-10-CM

## 2024-05-19 NOTE — Progress Notes (Signed)
 Called patient but no answer, left voice mail for patient to call back.

## 2024-05-19 NOTE — Telephone Encounter (Signed)
 Copied from CRM [id number]. Topic: Clinical - Lab/Test Results >> May 19, 2024  9:20 AM Precious C wrote: Reason for CRM: Tiffany from [facility] called regarding the patient's CT chest lung cancer screening. She wanted to confirm that the clinical office has received the screening results. She stated if there are any questions, the office may contact them back.

## 2024-05-19 NOTE — Telephone Encounter (Signed)
 Please advise pt that there is a new lung nodule noted on her CT.  I would like for her see the lung specialist.  CT should be repeated in 3 months.

## 2024-05-20 NOTE — Telephone Encounter (Signed)
 Results received

## 2024-05-20 NOTE — Telephone Encounter (Signed)
Spoke w/ Pt- informed of results and recommendations. Pt verbalized understanding.  

## 2024-05-27 ENCOUNTER — Encounter: Payer: Self-pay | Admitting: Gastroenterology

## 2024-05-27 ENCOUNTER — Ambulatory Visit (AMBULATORY_SURGERY_CENTER): Admitting: Gastroenterology

## 2024-05-27 VITALS — BP 143/74 | HR 87 | Temp 97.3°F | Resp 26 | Ht 65.0 in | Wt 181.0 lb

## 2024-05-27 DIAGNOSIS — K449 Diaphragmatic hernia without obstruction or gangrene: Secondary | ICD-10-CM

## 2024-05-27 DIAGNOSIS — K3189 Other diseases of stomach and duodenum: Secondary | ICD-10-CM | POA: Diagnosis not present

## 2024-05-27 DIAGNOSIS — Q399 Congenital malformation of esophagus, unspecified: Secondary | ICD-10-CM

## 2024-05-27 DIAGNOSIS — R1310 Dysphagia, unspecified: Secondary | ICD-10-CM | POA: Diagnosis not present

## 2024-05-27 DIAGNOSIS — K2980 Duodenitis without bleeding: Secondary | ICD-10-CM

## 2024-05-27 DIAGNOSIS — K259 Gastric ulcer, unspecified as acute or chronic, without hemorrhage or perforation: Secondary | ICD-10-CM

## 2024-05-27 MED ORDER — SODIUM CHLORIDE 0.9 % IV SOLN: 500.0000 mL | INTRAVENOUS | Status: DC

## 2024-05-27 NOTE — Progress Notes (Signed)
 Pt's states no medical or surgical changes since previsit or office visit.

## 2024-05-27 NOTE — Op Note (Signed)
 [facility] Patient Name: Jaime Harris Procedure Date: 05/27/2024 9:53 AM MRN: [id number] Endoscopist: Aloha Finner , MD, [id number] Age: 68 Referring MD:  Date of Birth: 1956/05/09 Gender: Female Account #: [id number] Procedure:                Upper GI endoscopy Indications:              Dysphagia, Heartburn, Exclusion of hiatal hernia,                            Early satiety Medicines:                Monitored Anesthesia Care Procedure:                Pre-Anesthesia Assessment:                           - Prior to the procedure, a History and Physical                            was performed, and patient medications and                            allergies were reviewed. The patient's tolerance of                            previous anesthesia was also reviewed. The risks                            and benefits of the procedure and the sedation                            options and risks were discussed with the patient.                            All questions were answered, and informed consent                            was obtained. Prior Anticoagulants: The patient has                            taken Plavix  (clopidogrel ), last dose was 5 days                            prior to procedure. ASA Grade Assessment: III - A                            patient with severe systemic disease. After                            reviewing the risks and benefits, the patient was                            deemed in satisfactory condition to undergo the  procedure.                           After obtaining informed consent, the endoscope was                            passed under direct vision. Throughout the                            procedure, the patient's blood pressure, pulse, and                            oxygen saturations were monitored continuously. The                            GIF [id number] #[id number] was introduced through the                             mouth, and advanced to the second part of duodenum.                            The upper GI endoscopy was accomplished without                            difficulty. The patient tolerated the procedure. Scope In: Scope Out: Findings:                 The examined esophagus was moderately tortuous.                           No gross mucosal were noted in the entire                            esophagus. Biopsies were taken with a cold forceps                            for histology to rule out EOE/LOE. After the rest                            of the EGD was completed, a guidewire was placed                            and the scope was withdrawn. Dilation was performed                            with a Savary dilator with mild resistance at 18                            mm. The dilation site was examined following                            endoscope reinsertion and showed no change.  The Z-line was regular and was found 31 cm from the                            incisors.                           A 5 cm hiatal hernia was present.                           A few dispersed small erosions with no bleeding and                            no stigmata of recent bleeding were found in the                            gastric antrum.                           No other gross lesions were noted in the entire                            examined stomach. Biopsies were taken with a cold                            forceps for histology and Helicobacter pylori                            testing.                           A single 12 mm subepithelial nodule was found in                            the second portion of the duodenum. Tunneled                            biopsies were taken with a cold forceps for                            histology.                           No other gross lesions were noted in the duodenal                            bulb, in the first  portion of the duodenum and in                            the second portion of the duodenum. Complications:            No immediate complications. Estimated Blood Loss:     Estimated blood loss was minimal. Impression:               - Tortuous esophagus.                           -  No gross mucosal lesions in the entire esophagus.                            Biopsied. Dilated to 18 mm savory.                           - Z-line regular, 31 cm from the incisors.                           - 5 cm hiatal hernia.                           - Erosive gastropathy with no bleeding and no                            stigmata of recent bleeding in the antrum. No other                            gross lesions in the entire stomach. Biopsied.                           - Subepithelial nodule found in D2. Tunnel biopsied.                           - No other gross lesions in the duodenal bulb, in                            the first portion of the duodenum and in the second                            portion of the duodenum. Recommendation:           - The patient will be observed post-procedure,                            until all discharge criteria are met.                           - Discharge patient to home.                           - Patient has a contact number available for                            emergencies. The signs and symptoms of potential                            delayed complications were discussed with the                            patient. Return to normal activities tomorrow.                            Written discharge instructions were provided to the  patient.                           - Dilation diet as per protocol.                           - Please use Cepacol or Halls Lozenges +/-                            Chloraseptic spray for next 72-96 hours to aid in                            sore thoat should you experience this.                            - Continue present medications.                           - Await pathology results.                           - Query if her symptomatology of dysphagia persists                            whether this is a result of her hiatal hernia +/-                            dysmotility. If issues become more significant, she                            may require hiatal hernia repair would require                            manometry testing.                           - She separately discussed/described a symptom of                            early satiety. If that symptom persists, would                            consider solid food gastric emptying study to                            evaluate this further (she does not have actual                            nausea or vomiting regularly however thus the                            question of whether this could be tied to the                            hiatal hernia  as well needs to be kept in mind.                           - Observe patient's clinical course.                           - May restart Plavix  on 9/17 PM.                           - Continue present medications.                           - Consideration of EUS in future for duodenal                            nodule evaluation if tunnel biopsies do not show                            evidence of lipoma.                           - The findings and recommendations were discussed                            with the patient.                           - The findings and recommendations were discussed                            with the patient's family. Aloha Finner, MD 05/27/2024 10:31:17 AM

## 2024-05-27 NOTE — Progress Notes (Signed)
 A/o x 3, VSS, gd SR's, pleased with anesthesia, report to RN

## 2024-05-27 NOTE — Patient Instructions (Addendum)
 May use cepacol, halls lozenges, or chloraseptic spray for the next few days for sore throat May resume Plavix  on Wednesday night, 9/17  YOU HAD AN ENDOSCOPIC PROCEDURE TODAY AT THE [facility]:   Refer to the procedure report that was given to you for any specific questions about what was found during the examination.  If the procedure report does not answer your questions, please call your gastroenterologist to clarify.  If you requested that your care partner not be given the details of your procedure findings, then the procedure report has been included in a sealed envelope for you to review at your convenience later.  YOU SHOULD EXPECT: Some feelings of bloating in the abdomen. Passage of more gas than usual.  Walking can help get rid of the air that was put into your GI tract during the procedure and reduce the bloating. If you had a lower endoscopy (such as a colonoscopy or flexible sigmoidoscopy) you may notice spotting of blood in your stool or on the toilet paper. If you underwent a bowel prep for your procedure, you may not have a normal bowel movement for a few days.  Please Note:  You might notice some irritation and congestion in your nose or some drainage.  This is from the oxygen used during your procedure.  There is no need for concern and it should clear up in a day or so.  SYMPTOMS TO REPORT IMMEDIATELY:  Following upper endoscopy (EGD)  Vomiting of blood or coffee ground material  New chest pain or pain under the shoulder blades  Painful or persistently difficult swallowing  New shortness of breath  Fever of 100F or higher  Black, tarry-looking stools  For urgent or emergent issues, a gastroenterologist can be reached at any hour by calling [phone]. Do not use MyChart messaging for urgent concerns.    DIET:  We do recommend dilation diet today (see handout), but then you may proceed to your regular diet tomorrow.  Drink plenty of fluids but you should  avoid alcoholic beverages for 24 hours.  ACTIVITY:  You should plan to take it easy for the rest of today and you should NOT DRIVE or use heavy machinery until tomorrow (because of the sedation medicines used during the test).    FOLLOW UP: Our staff will call the number listed on your records the next business day following your procedure.  We will call around 7:15- 8:00 am to check on you and address any questions or concerns that you may have regarding the information given to you following your procedure. If we do not reach you, we will leave a message.     If any biopsies were taken you will be contacted by phone or by letter within the next 1-3 weeks.  Please call us  at [phone] if you have not heard about the biopsies in 3 weeks.    SIGNATURES/CONFIDENTIALITY: You and/or your care partner have signed paperwork which will be entered into your electronic medical record.  These signatures attest to the fact that that the information above on your After Visit Summary has been reviewed and is understood.  Full responsibility of the confidentiality of this discharge information lies with you and/or your care-partner.

## 2024-05-27 NOTE — Progress Notes (Signed)
 Called to room to assist during endoscopic procedure.  Patient ID and intended procedure confirmed with present staff. Received instructions for my participation in the procedure from the performing physician.

## 2024-05-27 NOTE — Progress Notes (Signed)
 GASTROENTEROLOGY PROCEDURE H&P NOTE   Primary Care Physician: Jaime Setter, NP  HPI: Jaime Harris is a 68 y.o. female who presents for EGD for evaluation of dysphagia.  Past Medical History:  Diagnosis Date   Aneurysm (HCC) 2020   supraclinoid R int carotid artery   CAD (coronary artery disease)    LHC 01/03/2022: 30% of pRCA followed by 80% of mRCA s/p DES, 20% of pLAD, &30% of p-mLCX   Chronic tension headaches    Headache    Hyperlipidemia    borderline   Non-ST elevation (NSTEMI) myocardial infarction (HCC) 01/03/2022   Sickle cell trait (HCC)    Thyroid  disease    enlarged thyroid    Vertigo    Vitamin D  deficiency    Past Surgical History:  Procedure Laterality Date   COLONOSCOPY  2016   COLONOSCOPY WITH PROPOFOL   10/27/2022   CORONARY PRESSURE/FFR STUDY N/A 01/03/2022   Procedure: INTRAVASCULAR PRESSURE WIRE/FFR STUDY;  Surgeon: Jaime Lonni BIRCH, MD;  Location: [facility];  Service: Cardiovascular;  Laterality: N/A;   CORONARY STENT INTERVENTION N/A 01/03/2022   Procedure: CORONARY STENT INTERVENTION;  Surgeon: Jaime Lonni BIRCH, MD;  Location: [facility];  Service: Cardiovascular;  Laterality: N/A;   FOOT SURGERY Bilateral    Pt states she had bones removed from the sides of her feet and middle toes so she could walk better.   LEFT HEART CATH AND CORONARY ANGIOGRAPHY N/A 01/03/2022   Procedure: LEFT HEART CATH AND CORONARY ANGIOGRAPHY;  Surgeon: Jaime Lonni BIRCH, MD;  Location: [facility];  Service: Cardiovascular;  Laterality: N/A;   TONSILLECTOMY  09/11/1973   TUBAL LIGATION     Current Outpatient Medications  Medication Sig Dispense Refill   atorvastatin  (LIPITOR ) 80 MG tablet Take 1 tablet (80 mg total) by mouth daily. 90 tablet 3   buPROPion  (WELLBUTRIN  SR) 150 MG 12 hr tablet Take 1 tablet (150 mg total) by mouth 2 (two) times daily. 180 tablet 0   cyclobenzaprine  (FLEXERIL ) 5 MG tablet Take 1 tablet (5  mg total) by mouth 3 (three) times daily as needed for muscle spasms. 30 tablet 0   ezetimibe  (ZETIA ) 10 MG tablet Take 1 tablet (10 mg total) by mouth daily. 90 tablet 0   mirabegron  ER (MYRBETRIQ ) 25 MG TB24 tablet Take 1 tablet (25 mg total) by mouth daily. 30 tablet 5   Multiple Vitamin (MULTIVITAMIN) tablet Take 1 tablet by mouth daily.     pantoprazole  (PROTONIX ) 40 MG tablet Take 1 tablet (40 mg total) by mouth daily. 30 tablet 5   APPLE CIDER VINEGAR PO Take 1 capsule by mouth daily.     clopidogrel  (PLAVIX ) 75 MG tablet Take 1 tablet (75 mg total) by mouth daily. 90 tablet 0   gabapentin  (NEURONTIN ) 300 MG capsule TAKE 1 CAPSULE BY MOUTH EVERYDAY AT BEDTIME 90 capsule 1   nitroGLYCERIN  (NITROSTAT ) 0.4 MG SL tablet Place 1 tablet (0.4 mg total) under the tongue every 5 (five) minutes as needed for chest pain. 12 tablet 0   Current Facility-Administered Medications  Medication Dose Route Frequency Provider Last Rate Last Admin   0.9 %  sodium chloride  infusion  500 mL Intravenous Continuous Mansouraty, [name], MD        Current Outpatient Medications:    atorvastatin  (LIPITOR ) 80 MG tablet, Take 1 tablet (80 mg total) by mouth daily., Disp: 90 tablet, Rfl: 3   buPROPion  (WELLBUTRIN  SR) 150 MG 12 hr tablet, Take 1  tablet (150 mg total) by mouth 2 (two) times daily., Disp: 180 tablet, Rfl: 0   cyclobenzaprine  (FLEXERIL ) 5 MG tablet, Take 1 tablet (5 mg total) by mouth 3 (three) times daily as needed for muscle spasms., Disp: 30 tablet, Rfl: 0   ezetimibe  (ZETIA ) 10 MG tablet, Take 1 tablet (10 mg total) by mouth daily., Disp: 90 tablet, Rfl: 0   mirabegron  ER (MYRBETRIQ ) 25 MG TB24 tablet, Take 1 tablet (25 mg total) by mouth daily., Disp: 30 tablet, Rfl: 5   Multiple Vitamin (MULTIVITAMIN) tablet, Take 1 tablet by mouth daily., Disp: , Rfl:    pantoprazole  (PROTONIX ) 40 MG tablet, Take 1 tablet (40 mg total) by mouth daily., Disp: 30 tablet, Rfl: 5   APPLE CIDER VINEGAR PO, Take 1  capsule by mouth daily., Disp: , Rfl:    clopidogrel  (PLAVIX ) 75 MG tablet, Take 1 tablet (75 mg total) by mouth daily., Disp: 90 tablet, Rfl: 0   gabapentin  (NEURONTIN ) 300 MG capsule, TAKE 1 CAPSULE BY MOUTH EVERYDAY AT BEDTIME, Disp: 90 capsule, Rfl: 1   nitroGLYCERIN  (NITROSTAT ) 0.4 MG SL tablet, Place 1 tablet (0.4 mg total) under the tongue every 5 (five) minutes as needed for chest pain., Disp: 12 tablet, Rfl: 0  Current Facility-Administered Medications:    0.9 %  sodium chloride  infusion, 500 mL, Intravenous, Continuous, Jaime Harris., MD No Known Allergies Family History  Problem Relation Age of Onset   Heart disease Mother        pacemaker/defibrillator   Diabetes Mother    Hypertension Sister    Diabetes Brother    Heart disease Paternal Grandmother        died of CHF   Diabetes Paternal Grandmother    Cancer Neg Hx    Kidney disease Neg Hx    Colon cancer Neg Hx    Colon polyps Neg Hx    Rectal cancer Neg Hx    Stomach cancer Neg Hx    Esophageal cancer Neg Hx    Social History   Socioeconomic History   Marital status: Single    Spouse name: Not on file   Number of children: 3   Years of education: Not on file   Highest education level: GED or equivalent  Occupational History    Comment: Conservation officer, nature  Tobacco Use   Smoking status: Every Day    Current packs/day: 0.50    Types: Cigarettes    Passive exposure: Never   Smokeless tobacco: Never  Vaping Use   Vaping status: Never Used  Substance and Sexual Activity   Alcohol use: Yes    Comment: occasional   Drug use: No   Sexual activity: Not Currently    Partners: Male    Birth control/protection: Post-menopausal  Other Topics Concern   Not on file  Social History Narrative   Makes eye glasses.     Single   GED   She has 3 children   2- Son Jaime Harris- lives locally- he has one son   65- Daughter Jaime Harris- 7 children (4 of these children live with the pt- she is a primary care giver)   1985-  Jaime Harris- lives in [place] 1 daughter   Enjoys reading   Caffeine- coffee, 4 cups daily, soda every other day   Social Drivers of Health   Financial Resource Strain: Low Risk  (04/03/2024)   Overall Financial Resource Strain (CARDIA)    Difficulty of Paying Living Expenses: Not very hard  Food Insecurity: Food Insecurity  Present (04/03/2024)   Hunger Vital Sign    Worried About Running Out of Food in the Last Year: Never true    Ran Out of Food in the Last Year: Sometimes true  Transportation Needs: No Transportation Needs (04/03/2024)   PRAPARE - Administrator, Civil Service (Medical): No    Lack of Transportation (Non-Medical): No  Physical Activity: Inactive (04/03/2024)   Exercise Vital Sign    Days of Exercise per Week: 0 days    Minutes of Exercise per Session: Not on file  Stress: No Stress Concern Present (04/03/2024)   [employer] of Occupational Health - Occupational Stress Questionnaire    Feeling of Stress: Only a little  Social Connections: Moderately Isolated (04/03/2024)   Social Connection and Isolation Panel    Frequency of Communication with Friends and Family: More than three times a week    Frequency of Social Gatherings with Friends and Family: More than three times a week    Attends Religious Services: More than 4 times per year    Active Member of Clubs or Organizations: No    Attends Engineer, structural: Not on file    Marital Status: Divorced  Intimate Partner Violence: Not on file    Physical Exam: Today's Vitals   05/27/24 0907 05/27/24 0908  BP: (!) 143/76   Pulse: 87   Temp: (!) 97.3 F (36.3 C) (!) 97.3 F (36.3 C)  TempSrc: Temporal   SpO2: 96%   Weight: 181 lb (82.1 kg)   Height: 5' 5 (1.651 m)   PainSc: 0-No pain 0-No pain   Body mass index is 30.12 kg/m. GEN: NAD EYE: Sclerae anicteric ENT: MMM CV: Non-tachycardic GI: Soft, NT/ND NEURO:  Alert & Oriented x 3  Lab Results: No results for input(s):  WBC, HGB, HCT, PLT in the last 72 hours. BMET No results for input(s): NA, K, CL, CO2, GLUCOSE, BUN, CREATININE, CALCIUM  in the last 72 hours. LFT No results for input(s): PROT, ALBUMIN, AST, ALT, ALKPHOS, BILITOT, BILIDIR, IBILI in the last 72 hours. PT/INR No results for input(s): LABPROT, INR in the last 72 hours.   Impression / Plan: This is a 68 y.o.female who presents for EGD for evaluation of dysphagia.  The risks and benefits of endoscopic evaluation/treatment were discussed with the patient and/or family; these include but are not limited to the risk of perforation, infection, bleeding, missed lesions, lack of diagnosis, severe illness requiring hospitalization, as well as anesthesia and sedation related illnesses.  The patient's history has been reviewed, patient examined, no change in status, and deemed stable for procedure.  The patient and/or family is agreeable to proceed.    Aloha Finner, MD [facility] Office # [phone]

## 2024-05-28 ENCOUNTER — Telehealth: Payer: Self-pay

## 2024-05-28 DIAGNOSIS — H1132 Conjunctival hemorrhage, left eye: Secondary | ICD-10-CM | POA: Diagnosis not present

## 2024-05-28 NOTE — Telephone Encounter (Signed)
 Follow up call to pt, lm for pt to call if having any difficulty with normal activities or eating and drinking.  Also to call if any other questions or concerns.

## 2024-05-29 LAB — SURGICAL PATHOLOGY

## 2024-05-30 ENCOUNTER — Ambulatory Visit: Payer: Self-pay | Admitting: Gastroenterology

## 2024-05-30 NOTE — Progress Notes (Signed)
 Recalls have been entered as ordered

## 2024-06-04 ENCOUNTER — Ambulatory Visit (INDEPENDENT_AMBULATORY_CARE_PROVIDER_SITE_OTHER): Admitting: Family

## 2024-06-04 ENCOUNTER — Encounter: Payer: Self-pay | Admitting: Family

## 2024-06-04 VITALS — BP 127/57 | HR 86 | Temp 97.6°F | Resp 16 | Ht 65.0 in | Wt 181.0 lb

## 2024-06-04 DIAGNOSIS — Z780 Asymptomatic menopausal state: Secondary | ICD-10-CM

## 2024-06-04 DIAGNOSIS — R911 Solitary pulmonary nodule: Secondary | ICD-10-CM | POA: Insufficient documentation

## 2024-06-04 DIAGNOSIS — Z1231 Encounter for screening mammogram for malignant neoplasm of breast: Secondary | ICD-10-CM | POA: Diagnosis not present

## 2024-06-04 DIAGNOSIS — Z720 Tobacco use: Secondary | ICD-10-CM

## 2024-06-04 DIAGNOSIS — Z0000 Encounter for general adult medical examination without abnormal findings: Secondary | ICD-10-CM | POA: Diagnosis not present

## 2024-06-04 NOTE — Assessment & Plan Note (Signed)
 She is cutting back with the help of wellbutrin . Encouraged complete cessation.

## 2024-06-04 NOTE — Assessment & Plan Note (Signed)
 Declines flu shot. Recommend Shingrix, covid booster at the pharmacy.  Update mammo/dexa. Colo up to date. Encouraged healthy diet, 30 minutes of walking/day and weight loss.

## 2024-06-04 NOTE — Progress Notes (Signed)
 Subjective:     Patient ID: Jaime Harris, female    DOB: 1955-10-08, 68 y.o.   MRN: [id number]  Chief Complaint  Patient presents with   Annual Exam    HPI  Discussed the use of AI scribe software for clinical note transcription with the patient, who gave verbal consent to proceed.  History of Present Illness  Patient presents today for complete physical.  Since last visit she has had an endoscopy with negative biopsies- plan for 6-12 month follow up EUS for one nodule and a CT lung cancer screening which shows a new nodule. Referral has been placed to pulmonology.   Immunizations: declines flu shot Diet: I don't eat a lot, likes to eat grapes/bananas, drinks coffee, has cut back on pepsi Exercise:  not exercising regularly Colonoscopy: 2/24 due 3/21 Dexa: due Pap Smear: aged out Mammogram: due 10/7 Vision: up to date Dental: has no teeth     Health Maintenance Due  Topic Date Due   Zoster Vaccines- Shingrix (1 of 2) Never done   COVID-19 Vaccine (3 - 2024-25 season) 05/12/2024    Past Medical History:  Diagnosis Date   Aneurysm 2020   supraclinoid R int carotid artery   CAD (coronary artery disease)    LHC 01/03/2022: 30% of pRCA followed by 80% of mRCA s/p DES, 20% of pLAD, &30% of p-mLCX   Chronic tension headaches    Headache    Hyperlipidemia    borderline   Non-ST elevation (NSTEMI) myocardial infarction (HCC) 01/03/2022   Sickle cell trait    Thyroid  disease    enlarged thyroid    Vertigo    Vitamin D  deficiency     Past Surgical History:  Procedure Laterality Date   COLONOSCOPY  2016   COLONOSCOPY WITH PROPOFOL   10/27/2022   CORONARY PRESSURE/FFR STUDY N/A 01/03/2022   Procedure: INTRAVASCULAR PRESSURE WIRE/FFR STUDY;  Surgeon: Verlin Lonni BIRCH, MD;  Location: [facility];  Service: Cardiovascular;  Laterality: N/A;   CORONARY STENT INTERVENTION N/A 01/03/2022   Procedure: CORONARY STENT INTERVENTION;  Surgeon: Verlin Lonni BIRCH, MD;  Location: [facility];  Service: Cardiovascular;  Laterality: N/A;   FOOT SURGERY Bilateral    Pt states she had bones removed from the sides of her feet and middle toes so she could walk better.   LEFT HEART CATH AND CORONARY ANGIOGRAPHY N/A 01/03/2022   Procedure: LEFT HEART CATH AND CORONARY ANGIOGRAPHY;  Surgeon: Verlin Lonni BIRCH, MD;  Location: [facility];  Service: Cardiovascular;  Laterality: N/A;   TONSILLECTOMY  09/11/1973   TUBAL LIGATION      Family History  Problem Relation Age of Onset   Heart disease Mother        pacemaker/defibrillator   Diabetes Mother    Hypertension Sister    Diabetes Brother    Heart disease Paternal Grandmother        died of CHF   Diabetes Paternal Grandmother    Cancer Neg Hx    Kidney disease Neg Hx    Colon cancer Neg Hx    Colon polyps Neg Hx    Rectal cancer Neg Hx    Stomach cancer Neg Hx    Esophageal cancer Neg Hx     Social History   Socioeconomic History   Marital status: Single    Spouse name: Not on file   Number of children: 3   Years of education: Not on file   Highest education level: GED  or equivalent  Occupational History    Comment: Cashier  Tobacco Use   Smoking status: Every Day    Current packs/day: 0.50    Types: Cigarettes    Passive exposure: Never   Smokeless tobacco: Never  Vaping Use   Vaping status: Never Used  Substance and Sexual Activity   Alcohol use: Yes    Comment: occasional   Drug use: No   Sexual activity: Not Currently    Partners: Male    Birth control/protection: Post-menopausal  Other Topics Concern   Not on file  Social History Narrative   Makes eye glasses.     Single   GED   She has 3 children   66- Son Francis- lives locally- he has one son   89- Daughter Freddy- 7 children (4 of these children live with the pt- she is a primary care giver)   1985- michael- lives in [place] 1 daughter   Enjoys reading   Caffeine- coffee, 4  cups daily, soda every other day   Social Drivers of Health   Financial Resource Strain: Low Risk  (04/03/2024)   Overall Financial Resource Strain (CARDIA)    Difficulty of Paying Living Expenses: Not very hard  Food Insecurity: Food Insecurity Present (04/03/2024)   Hunger Vital Sign    Worried About Running Out of Food in the Last Year: Never true    Ran Out of Food in the Last Year: Sometimes true  Transportation Needs: No Transportation Needs (04/03/2024)   PRAPARE - Administrator, Civil Service (Medical): No    Lack of Transportation (Non-Medical): No  Physical Activity: Inactive (04/03/2024)   Exercise Vital Sign    Days of Exercise per Week: 0 days    Minutes of Exercise per Session: Not on file  Stress: No Stress Concern Present (04/03/2024)   [employer] of Occupational Health - Occupational Stress Questionnaire    Feeling of Stress: Only a little  Social Connections: Moderately Isolated (04/03/2024)   Social Connection and Isolation Panel    Frequency of Communication with Friends and Family: More than three times a week    Frequency of Social Gatherings with Friends and Family: More than three times a week    Attends Religious Services: More than 4 times per year    Active Member of [employer] or Organizations: No    Attends Engineer, structural: Not on file    Marital Status: Divorced  Intimate Partner Violence: Not on file    Outpatient Medications Prior to Visit  Medication Sig Dispense Refill   APPLE CIDER VINEGAR PO Take 1 capsule by mouth daily.     atorvastatin  (LIPITOR ) 80 MG tablet Take 1 tablet (80 mg total) by mouth daily. 90 tablet 3   buPROPion  (WELLBUTRIN  SR) 150 MG 12 hr tablet Take 1 tablet (150 mg total) by mouth 2 (two) times daily. 180 tablet 0   clopidogrel  (PLAVIX ) 75 MG tablet Take 1 tablet (75 mg total) by mouth daily. 90 tablet 0   cyclobenzaprine  (FLEXERIL ) 5 MG tablet Take 1 tablet (5 mg total) by mouth 3 (three) times daily  as needed for muscle spasms. 30 tablet 0   ezetimibe  (ZETIA ) 10 MG tablet Take 1 tablet (10 mg total) by mouth daily. 90 tablet 0   gabapentin  (NEURONTIN ) 300 MG capsule TAKE 1 CAPSULE BY MOUTH EVERYDAY AT BEDTIME 90 capsule 1   mirabegron  ER (MYRBETRIQ ) 25 MG TB24 tablet Take 1 tablet (25 mg total) by mouth  daily. 30 tablet 5   Multiple Vitamin (MULTIVITAMIN) tablet Take 1 tablet by mouth daily.     nitroGLYCERIN  (NITROSTAT ) 0.4 MG SL tablet Place 1 tablet (0.4 mg total) under the tongue every 5 (five) minutes as needed for chest pain. 12 tablet 0   pantoprazole  (PROTONIX ) 40 MG tablet Take 1 tablet (40 mg total) by mouth daily. 30 tablet 5   No facility-administered medications prior to visit.    No Known Allergies  Review of Systems  Constitutional:  Negative for weight loss.  HENT:  Negative for hearing loss.   Eyes:  Negative for blurred vision.  Respiratory:  Positive for cough (smoker's cough).   Cardiovascular:  Negative for leg swelling.  Gastrointestinal:  Positive for constipation. Negative for diarrhea.  Genitourinary:  Negative for dysuria and frequency.  Skin:  Negative for rash.  Neurological:  Positive for headaches (migraines about every 2-3 months).  Psychiatric/Behavioral:  Negative for depression. The patient is not nervous/anxious.        Objective:    Physical Exam   BP (!) 127/57 (BP Location: Right Arm, Patient Position: Sitting, Cuff Size: Normal)   Pulse 86   Temp 97.6 F (36.4 C) (Oral)   Resp 16   Ht 5' 5 (1.651 m)   Wt 181 lb (82.1 kg)   SpO2 98%   BMI 30.12 kg/m  Wt Readings from Last 3 Encounters:  06/04/24 181 lb (82.1 kg)  05/27/24 181 lb (82.1 kg)  04/14/24 181 lb 2 oz (82.2 kg)   Physical Exam  Constitutional: She is oriented to person, place, and time. She appears well-developed and well-nourished. No distress.  HENT:  Head: Normocephalic and atraumatic.  Right Ear: Tympanic membrane and ear canal normal.  Left Ear: Tympanic  membrane and ear canal normal.  Mouth/Throat: Oropharynx is clear and moist.  Eyes: Pupils are equal, round, and reactive to light. No scleral icterus.  Neck: Normal range of motion. No thyromegaly present.  Cardiovascular: Normal rate and regular rhythm.   No murmur heard. Pulmonary/Chest: Effort normal and breath sounds normal. No respiratory distress. He has no wheezes. She has no rales. She exhibits no tenderness.  Abdominal: Soft. Bowel sounds are normal. She exhibits no distension and no mass. There is no tenderness. There is no rebound and no guarding.  Musculoskeletal: She exhibits no edema.  Lymphadenopathy:    She has no cervical adenopathy.  Neurological: She is alert and oriented to person, place, and time. She has 2+ right patellar reflex, absent left patellar reflex.  She exhibits normal muscle tone. Coordination normal.  Skin: Skin is warm and dry.  Psychiatric: She has a normal mood and affect. Her behavior is normal. Judgment and thought content normal.  Breast/Pelvic: deferred            Assessment & Plan:       Assessment & Plan:   Problem List Items Addressed This Visit       High   Tobacco abuse   She is cutting back with the help of wellbutrin . Encouraged complete cessation.         Unprioritized   Solitary lung nodule   New finding on CT.  Has appointment next week with Pulmonary.       Preventative health care   Declines flu shot. Recommend Shingrix, covid booster at the pharmacy.  Update mammo/dexa. Colo up to date. Encouraged healthy diet, 30 minutes of walking/day and weight loss.      Other Visit Diagnoses  Postmenopausal estrogen deficiency    -  Primary   Relevant Orders   DG Bone Density     Breast cancer screening by mammogram       Relevant Orders   MM 3D DIAGNOSTIC MAMMOGRAM BILATERAL BREAST       I am having Jaime Harris maintain her multivitamin, APPLE CIDER VINEGAR PO, nitroGLYCERIN , cyclobenzaprine ,  atorvastatin , mirabegron  ER, ezetimibe , clopidogrel , gabapentin , pantoprazole , and buPROPion .  No orders of the defined types were placed in this encounter.

## 2024-06-04 NOTE — Assessment & Plan Note (Signed)
 New finding on CT.  Has appointment next week with Pulmonary.

## 2024-06-11 ENCOUNTER — Ambulatory Visit: Admitting: Acute Care

## 2024-06-11 ENCOUNTER — Telehealth: Payer: Self-pay | Admitting: *Deleted

## 2024-06-11 ENCOUNTER — Ambulatory Visit

## 2024-06-11 NOTE — Telephone Encounter (Signed)
 ATC x1.  Left detailed message per DPR to call back to reschedule OV that was missed this morning.  When she calls back, please reschedule her first available per Dr. Zaida.

## 2024-06-12 ENCOUNTER — Ambulatory Visit (HOSPITAL_BASED_OUTPATIENT_CLINIC_OR_DEPARTMENT_OTHER)
Admission: RE | Admit: 2024-06-12 | Discharge: 2024-06-12 | Disposition: A | Discharge status: Home / Self Care | Source: Ambulatory Visit | Attending: Family | Admitting: Family

## 2024-06-12 ENCOUNTER — Ambulatory Visit: Payer: Self-pay | Admitting: Family

## 2024-06-12 DIAGNOSIS — Z780 Asymptomatic menopausal state: Secondary | ICD-10-CM | POA: Diagnosis not present

## 2024-06-24 ENCOUNTER — Ambulatory Visit

## 2024-06-26 ENCOUNTER — Ambulatory Visit

## 2024-07-02 ENCOUNTER — Ambulatory Visit
Admission: RE | Admit: 2024-07-02 | Discharge: 2024-07-02 | Disposition: A | Discharge status: Home / Self Care | Source: Ambulatory Visit | Attending: Family | Admitting: Family

## 2024-07-02 DIAGNOSIS — Z1231 Encounter for screening mammogram for malignant neoplasm of breast: Secondary | ICD-10-CM

## 2024-07-18 ENCOUNTER — Ambulatory Visit (INDEPENDENT_AMBULATORY_CARE_PROVIDER_SITE_OTHER)

## 2024-07-18 ENCOUNTER — Other Ambulatory Visit: Payer: Self-pay | Admitting: Medical Genetics

## 2024-07-18 VITALS — BP 139/88 | HR 84 | Temp 97.8°F | Ht 64.0 in | Wt 182.0 lb

## 2024-07-18 DIAGNOSIS — Z720 Tobacco use: Secondary | ICD-10-CM

## 2024-07-18 DIAGNOSIS — F17210 Nicotine dependence, cigarettes, uncomplicated: Secondary | ICD-10-CM | POA: Diagnosis not present

## 2024-07-18 DIAGNOSIS — R911 Solitary pulmonary nodule: Secondary | ICD-10-CM

## 2024-07-18 DIAGNOSIS — J449 Chronic obstructive pulmonary disease, unspecified: Secondary | ICD-10-CM

## 2024-07-18 DIAGNOSIS — G4733 Obstructive sleep apnea (adult) (pediatric): Secondary | ICD-10-CM

## 2024-07-18 MED ORDER — NICOTINE POLACRILEX 2 MG MT LOZG: 2.0000 mg | LOZENGE | OROMUCOSAL | 2 refills | Status: AC | PRN

## 2024-07-18 MED ORDER — STIOLTO RESPIMAT 2.5-2.5 MCG/ACT IN AERS
2.0000 | INHALATION_SPRAY | Freq: Every day | RESPIRATORY_TRACT | 5 refills | Status: AC
Start: 2024-07-18 — End: ?

## 2024-07-18 MED ORDER — VARENICLINE TARTRATE (STARTER) 0.5 MG X 11 & 1 MG X 42 PO TBPK: ORAL_TABLET | ORAL | 0 refills | Status: AC

## 2024-07-18 MED ORDER — NICOTINE 21 MG/24HR TD PT24: 21.0000 mg | MEDICATED_PATCH | Freq: Every day | TRANSDERMAL | 0 refills | Status: DC

## 2024-07-18 NOTE — Progress Notes (Signed)
 Subjective:   PATIENT ID: Jaime Harris GENDER: female DOB: June 17, 1956, MRN: [id number]   HPI Discussed the use of AI scribe software for clinical note transcription with the patient, who gave verbal consent to proceed.  History of Present Illness Jaime Harris is a 68 year old female who presents with a lung nodule and breathing issues. She was referred by her nurse practitioner primary provider for evaluation of a spot on her lung.  She has been experiencing shortness of breath on exertion for approximately six months, particularly when carrying groceries or walking from her car to her home. She does not need to stop while walking but feels short of breath after reaching her destination. No fever, chills, or night sweats are present.  She describes a cough that is more pronounced at night, feeling as though 'something's in my throat.' She often coughs up phlegm, which is usually light-colored or clear, and denies any blood in the sputum. These symptoms have also been present for about six months.  She has a significant smoking history, having smoked half a pack per day since she was 68 years old. She has attempted to quit smoking using Chantix  and Wellbutrin , but these were not effective. She has not tried nicotine  patches or lozenges.  Her past medical history includes a heart attack two years ago, for which she received a stent and was prescribed clopidogrel . She recalls taking prednisone  in the past, possibly related to breathing issues, but cannot remember the specifics. She denies having any inhalers at home currently but recalls using Advair many years ago. She has no history of asthma as a child.  She underwent a sleep study in 2019. 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 She has three children, ten grandchildren, and one great-grandchild with another on the way.     Past Medical History:  Diagnosis Date    Aneurysm 2020   supraclinoid R int carotid artery   CAD (coronary artery disease)    LHC 01/03/2022: 30% of pRCA followed by 80% of mRCA s/p DES, 20% of pLAD, &30% of p-mLCX   Chronic tension headaches    Headache    Hyperlipidemia    borderline   Non-ST elevation (NSTEMI) myocardial infarction (HCC) 01/03/2022   Sickle cell trait    Thyroid  disease    enlarged thyroid    Vertigo    Vitamin D  deficiency      Family History  Problem Relation Age of Onset   Heart disease Mother        pacemaker/defibrillator   Diabetes Mother    Hypertension Sister    Diabetes Brother    Heart disease Paternal Grandmother        died of CHF   Diabetes Paternal Grandmother    Cancer Neg Hx    Kidney disease Neg Hx    Colon cancer Neg Hx    Colon polyps Neg Hx    Rectal cancer Neg Hx    Stomach cancer Neg Hx    Esophageal cancer Neg Hx      Social History   Socioeconomic History   Marital status: Single    Spouse name: Not on file   Number of children: 3   Years of education: Not on file   Highest education level: GED or equivalent  Occupational History    Comment: Conservation Officer, [employer]  Tobacco Use   Smoking status: Every Day    Current packs/day:  0.50    Average packs/day: 0.5 packs/day for 51.8 years (25.9 ttl pk-yrs)    Types: Cigarettes    Start date: 8    Passive exposure: Current   Smokeless tobacco: Never  Vaping Use   Vaping status: Never Used  Substance and Sexual Activity   Alcohol use: Yes    Comment: occasional   Drug use: No   Sexual activity: Not Currently    Partners: Male    Birth control/protection: Post-menopausal  Other Topics Concern   Not on file  Social History Narrative   Makes eye glasses.     Single   GED   She has 3 children   46- Son Francis- lives locally- he has one son   51- Daughter Freddy- 7 children (4 of these children live with the pt- she is a primary care giver)   1985- michael- lives in [place] 1 daughter   Enjoys reading   Caffeine-  coffee, 4 cups daily, soda every other day   Social Drivers of Health   Financial Resource Strain: Low Risk  (04/03/2024)   Overall Financial Resource Strain (CARDIA)    Difficulty of Paying Living Expenses: Not very hard  Food Insecurity: Food Insecurity Present (04/03/2024)   Hunger Vital Sign    Worried About Running Out of Food in the Last Year: Never true    Ran Out of Food in the Last Year: Sometimes true  Transportation Needs: No Transportation Needs (04/03/2024)   PRAPARE - Administrator, Civil Service (Medical): No    Lack of Transportation (Non-Medical): No  Physical Activity: Inactive (04/03/2024)   Exercise Vital Sign    Days of Exercise per Week: 0 days    Minutes of Exercise per Session: Not on file  Stress: No Stress Concern Present (04/03/2024)   [employer] of Occupational Health - Occupational Stress Questionnaire    Feeling of Stress: Only a little  Social Connections: Moderately Isolated (04/03/2024)   Social Connection and Isolation Panel    Frequency of Communication with Friends and Family: More than three times a week    Frequency of Social Gatherings with Friends and Family: More than three times a week    Attends Religious Services: More than 4 times per year    Active Member of [employer] or Organizations: No    Attends Engineer, Structural: Not on file    Marital Status: Divorced  Catering Manager Violence: Not on file     No Known Allergies   Outpatient Medications Prior to Visit  Medication Sig Dispense Refill   APPLE CIDER VINEGAR PO Take 1 capsule by mouth daily.     atorvastatin  (LIPITOR ) 80 MG tablet Take 1 tablet (80 mg total) by mouth daily. 90 tablet 3   clopidogrel  (PLAVIX ) 75 MG tablet Take 1 tablet (75 mg total) by mouth daily. 90 tablet 0   cyclobenzaprine  (FLEXERIL ) 5 MG tablet Take 1 tablet (5 mg total) by mouth 3 (three) times daily as needed for muscle spasms. 30 tablet 0   ezetimibe  (ZETIA ) 10 MG tablet Take 1  tablet (10 mg total) by mouth daily. 90 tablet 0   gabapentin  (NEURONTIN ) 300 MG capsule TAKE 1 CAPSULE BY MOUTH EVERYDAY AT BEDTIME 90 capsule 1   mirabegron  ER (MYRBETRIQ ) 25 MG TB24 tablet Take 1 tablet (25 mg total) by mouth daily. 30 tablet 5   Multiple Vitamin (MULTIVITAMIN) tablet Take 1 tablet by mouth daily.     nitroGLYCERIN  (NITROSTAT ) 0.4 MG  SL tablet Place 1 tablet (0.4 mg total) under the tongue every 5 (five) minutes as needed for chest pain. 12 tablet 0   pantoprazole  (PROTONIX ) 40 MG tablet Take 1 tablet (40 mg total) by mouth daily. 30 tablet 5   buPROPion  (WELLBUTRIN  SR) 150 MG 12 hr tablet Take 1 tablet (150 mg total) by mouth 2 (two) times daily. 180 tablet 0   No facility-administered medications prior to visit.    ROS Reviewed all systems and reported negative except as above     Objective:   Vitals:   07/18/24 0909  BP: (!) 160/84  Pulse: 84  Temp: 97.8 F (36.6 C)  TempSrc: Oral  SpO2: 97%  Weight: 182 lb (82.6 kg)  Height: 5' 4 (1.626 m)    Physical Exam Physical Exam GENERAL: Appropriate to age, no acute distress. HEAD EYES EARS NOSE THROAT: Moist mucous membranes, atraumatic, normocephalic. CHEST: Clear to auscultation bilaterally, no wheezing, no crackles, no rales. CARDIAC: Regular rate and rhythm, normal S1, normal S2, no murmurs, no rubs, no gallops. ABDOMEN: Soft, nontender. NEUROLOGICAL: Motor and sensation grossly intact, alert and oriented times X 3. EXTREMITIES: Warm, well perfused, no edema.     CBC    Component Value Date/Time   WBC 9.4 01/04/2022 0409   RBC 4.30 01/04/2022 0409   HGB 12.6 01/04/2022 0409   HCT 36.5 01/04/2022 0409   PLT 179 01/04/2022 0409   MCV 84.9 01/04/2022 0409   MCH 29.3 01/04/2022 0409   MCHC 34.5 01/04/2022 0409   RDW 14.6 01/04/2022 0409   LYMPHSABS 2.0 01/03/2022 1015   MONOABS 0.3 01/03/2022 1015   EOSABS 0.0 01/03/2022 1015   BASOSABS 0.0 01/03/2022 1015     Chest imaging:       Assessment & Plan:   Assessment and Plan Assessment & Plan Chronic obstructive pulmonary disease (COPD) with emphysema COPD with emphysema confirmed by CT, presenting with exertional dyspnea and productive cough. History of smoking and previous prednisone  use. No current inhaler use. Mild sleep apnea noted. - Prescribed Stiolto respimat for dyspnea management. - Ordered pulmonary function tests (PFTs). - Educated on inhaler use.  Solitary pulmonary nodule, right middle lobe 6 mm solitary pulmonary nodule in right middle lobe on CT - Ordered follow-up CT scan in early December.  Tobacco use disorder Long-standing tobacco use disorder with previous unsuccessful cessation attempts. Strong desire to quit. - Prescribed Chantix  with titration instructions. - Advised nicotine  patches with Chantix . - Provided [place]  smoking cessation hotline information. - Encouraged willpower and commitment.  Smoking/Tobacco Cessation Counseling Jaime Harris is a current user of tobacco or nicotine  products. She is ready to quit at this time. Counseling provided today addressed the risks of continued use and the benefits of cessation. Discussed tobacco/nicotine  use history, readiness to quit, and evidence-based treatment options including behavioral strategies, support resources, and pharmacologic therapies. Provided encouragement and educational materials on steps and resources to quit smoking. Patient questions were addressed, and follow-up recommended for continued support. Total time spent on counseling: 3 minutes.          Zola Herter, MD [facility] Office: [phone]

## 2024-07-18 NOTE — Patient Instructions (Signed)
  VISIT SUMMARY: Today, we discussed your lung nodule and respiratory symptoms. You have been experiencing shortness of breath and a persistent cough for the past six months. We reviewed your smoking history and previous attempts to quit smoking. We also discussed your past medical history, including your heart attack and previous use of prednisone  for breathing issues.  YOUR PLAN: -CHRONIC OBSTRUCTIVE PULMONARY DISEASE (COPD) WITH EMPHYSEMA: COPD with emphysema is a chronic lung condition that makes it hard to breathe due to damage to the air sacs in the lungs. We have prescribed an inhaler to help manage your shortness of breath and provided a free sample. You will also undergo pulmonary function tests to assess your lung function. We have shown you how to use the inhaler properly.  -SOLITARY PULMONARY NODULE, RIGHT MIDDLE LOBE: A solitary pulmonary nodule is a small, round growth in the lung. Your nodule is 6 mm in size and currently too small for any intervention. We will monitor it with a follow-up CT scan in early December to check for any changes.  -TOBACCO USE DISORDER: Tobacco use disorder is a dependence on tobacco products. We have prescribed Chantix  to help you quit smoking and advised you to use nicotine  patches along with it. We also provided information for the [place]  smoking cessation hotline and encouraged you to stay committed to quitting.  INSTRUCTIONS: Please schedule your follow-up CT scan for early December. Use the inhaler as prescribed and follow the instructions provided. Begin taking Chantix  as directed and consider using nicotine  patches to aid in quitting smoking. If you have any questions or concerns, please contact our office.  Call 1-800-quit-NOW to get free nicotine  replacement and counseling from the state of [place]                         Contains text generated by [employer].                                  Contains text generated by [employer].

## 2024-08-18 ENCOUNTER — Ambulatory Visit: Admission: RE | Admit: 2024-08-18 | Discharge: 2024-08-18 | Disposition: A | Source: Ambulatory Visit

## 2024-08-18 DIAGNOSIS — R911 Solitary pulmonary nodule: Secondary | ICD-10-CM

## 2024-08-23 ENCOUNTER — Ambulatory Visit: Payer: Self-pay

## 2024-09-09 ENCOUNTER — Ambulatory Visit (INDEPENDENT_AMBULATORY_CARE_PROVIDER_SITE_OTHER)

## 2024-09-09 DIAGNOSIS — J449 Chronic obstructive pulmonary disease, unspecified: Secondary | ICD-10-CM

## 2024-09-09 LAB — PULMONARY FUNCTION TEST
DL/VA % pred: 81 %
DL/VA: 3.38 ml/min/mmHg/L
DLCO unc % pred: 39 %
DLCO unc: 8.09 ml/min/mmHg
FEF 25-75 Post: 0.81 L/s
FEF 25-75 Pre: 0.98 L/s
FEF2575-%Change-Post: -16 %
FEF2575-%Pred-Post: 39 %
FEF2575-%Pred-Pre: 47 %
FEV1-%Change-Post: -2 %
FEV1-%Pred-Post: 47 %
FEV1-%Pred-Pre: 49 %
FEV1-Post: 1.16 L
FEV1-Pre: 1.19 L
FEV1FVC-%Change-Post: -4 %
FEV1FVC-%Pred-Pre: 100 %
FEV6-%Change-Post: 0 %
FEV6-%Pred-Post: 51 %
FEV6-%Pred-Pre: 50 %
FEV6-Post: 1.56 L
FEV6-Pre: 1.55 L
FEV6FVC-%Change-Post: 0 %
FEV6FVC-%Pred-Post: 103 %
FEV6FVC-%Pred-Pre: 104 %
FVC-%Change-Post: 1 %
FVC-%Pred-Post: 49 %
FVC-%Pred-Pre: 48 %
FVC-Post: 1.57 L
FVC-Pre: 1.55 L
Post FEV1/FVC ratio: 74 %
Post FEV6/FVC ratio: 99 %
Pre FEV1/FVC ratio: 77 %
Pre FEV6/FVC Ratio: 100 %
RV % pred: 74 %
RV: 1.64 L
TLC % pred: 60 %
TLC: 3.12 L

## 2024-09-09 NOTE — Progress Notes (Signed)
 Full pft performed today

## 2024-09-09 NOTE — Patient Instructions (Signed)
 Full pft performed today

## 2024-09-10 ENCOUNTER — Ambulatory Visit

## 2024-09-16 ENCOUNTER — Other Ambulatory Visit: Payer: Self-pay

## 2024-09-16 DIAGNOSIS — Z720 Tobacco use: Secondary | ICD-10-CM

## 2024-09-30 ENCOUNTER — Other Ambulatory Visit (HOSPITAL_COMMUNITY)
Admission: RE | Admit: 2024-09-30 | Discharge: 2024-09-30 | Disposition: A | Discharge status: Home / Self Care | Payer: Self-pay | Source: Ambulatory Visit | Attending: Medical Genetics | Admitting: Medical Genetics

## 2024-10-07 LAB — GENECONNECT MOLECULAR SCREEN: Genetic Analysis Overall Interpretation: NEGATIVE

## 2024-10-09 ENCOUNTER — Ambulatory Visit (HOSPITAL_BASED_OUTPATIENT_CLINIC_OR_DEPARTMENT_OTHER)
Admission: RE | Admit: 2024-10-09 | Discharge: 2024-10-09 | Disposition: A | Discharge status: Home / Self Care | Source: Ambulatory Visit | Attending: Student | Admitting: Student

## 2024-10-09 ENCOUNTER — Ambulatory Visit: Payer: Self-pay

## 2024-10-09 ENCOUNTER — Ambulatory Visit (INDEPENDENT_AMBULATORY_CARE_PROVIDER_SITE_OTHER): Admitting: Student

## 2024-10-09 ENCOUNTER — Encounter: Payer: Self-pay | Admitting: Student

## 2024-10-09 ENCOUNTER — Other Ambulatory Visit (HOSPITAL_BASED_OUTPATIENT_CLINIC_OR_DEPARTMENT_OTHER): Payer: Self-pay

## 2024-10-09 VITALS — BP 128/78 | HR 78 | Temp 97.6°F | Resp 16 | Ht 65.0 in | Wt 182.8 lb

## 2024-10-09 DIAGNOSIS — M5416 Radiculopathy, lumbar region: Secondary | ICD-10-CM

## 2024-10-09 DIAGNOSIS — M79601 Pain in right arm: Secondary | ICD-10-CM

## 2024-10-09 MED ORDER — CYCLOBENZAPRINE HCL 5 MG PO TABS
5.0000 mg | ORAL_TABLET | Freq: Three times a day (TID) | ORAL | 0 refills | Status: AC | PRN
  Filled 2024-10-09: qty 30, 10d supply, fill #0

## 2024-10-09 MED ORDER — CYCLOBENZAPRINE HCL 5 MG PO TABS
5.0000 mg | ORAL_TABLET | Freq: Three times a day (TID) | ORAL | 0 refills | Status: DC | PRN
  Filled 2024-10-09: qty 30, 10d supply, fill #0

## 2024-10-09 NOTE — Progress Notes (Signed)
 "  Acute Office Visit  Subjective:     Patient ID: Jaime Harris, female    DOB: 11/05/1955, 69 y.o.   MRN: [id number]  Chief Complaint  Patient presents with   Arm Pain    Patient has ben having pain in her right arm for months. Needle-like and achy sensation. Upper arm to neck pain.    HPI Patient is in today for arm pain.  Patient reports chronic right arm pain for approximately 6 years with limited ROM, with worsening symptoms today. Pain is located above the elbow and is constant, moderate in severity, and worsens with use. She notes intermittent swelling of the right arm and radiation of pain to the shoulder blade and occasionally the neck. She has been taking gabapentin  for symptom relief. Denies recent injury or overuse. Denies trauma. Denies tingling, numbness.  Patient denies fever, chills, SOB, CP, palpitations, dyspnea, edema, HA, vision changes, N/V/D, abdominal pain, urinary symptoms, rash, weight changes, and recent illness or hospitalizations.   ROS  See HPI    Objective:    BP 128/78 (BP Location: Left Arm, Patient Position: Sitting, Cuff Size: Normal)   Pulse 78   Temp 97.6 F (36.4 C) (Oral)   Resp 16   Ht 5' 5 (1.651 m)   Wt 182 lb 12.8 oz (82.9 kg)   SpO2 98%   BMI 30.42 kg/m    Physical Exam Vitals reviewed.  Constitutional:      General: She is not in acute distress.    Appearance: She is not toxic-appearing.  HENT:     Head: Normocephalic and atraumatic.     Mouth/Throat:     Mouth: Mucous membranes are moist.     Pharynx: Oropharynx is clear.  Eyes:     Pupils: Pupils are equal, round, and reactive to light.  Cardiovascular:     Rate and Rhythm: Normal rate and regular rhythm.     Pulses: Normal pulses.     Heart sounds: Normal heart sounds. No murmur heard. Pulmonary:     Effort: Pulmonary effort is normal. No respiratory distress.     Breath sounds: Normal breath sounds. No wheezing.  Musculoskeletal:        General: No swelling.      Cervical back: Neck supple.     Comments: Right upper extremity without deformity or erythema.  TWP upper arm and periscapular region. Limited ROM of motion of shoulder and elbow with discomfort on use. Strength and sensation intact. No focal neurologic deficits.  Skin:    General: Skin is warm and dry.  Neurological:     General: No focal deficit present.     Mental Status: She is alert and oriented to person, place, and time.  Psychiatric:        Mood and Affect: Mood normal.        Behavior: Behavior normal.        Thought Content: Thought content normal.        Judgment: Judgment normal.       No results found for any visits on 10/09/24.      Assessment & Plan:   Problem List Items Addressed This Visit       Nervous and Auditory   Lumbar radiculopathy   Relevant Medications   cyclobenzaprine  (FLEXERIL ) 5 MG tablet   Other Relevant Orders   Ambulatory referral to Sports Medicine   Other Visit Diagnoses       Right arm pain    -  Primary  Relevant Medications   cyclobenzaprine  (FLEXERIL ) 5 MG tablet   Other Relevant Orders   DG Shoulder Right   Ambulatory referral to Sports Medicine      Chronic right arm pain x 6 months.  - Ordered right shoulder x-ray. - Rx-Flexeril  5mg  TID as needed, may use OTC acetaminophen  prn - Advised moist heat application. -Recommend activity modification, heat/ice, -Discussed referral to physical therapy, pt declines today.  -Refer to sports medicine for further evaluation       Meds ordered this encounter  Medications   cyclobenzaprine  (FLEXERIL ) 5 MG tablet    Sig: Take 1 tablet (5 mg total) by mouth 3 (three) times daily as needed for muscle spasms.    Dispense:  30 tablet    Refill:  0    Supervising Provider:   DOMENICA BLACKBIRD A [[id number]]    No follow-ups on file.  Harlene LITTIE Jolly, NP   "

## 2024-10-09 NOTE — Telephone Encounter (Signed)
 FYI Only or Action Required?: FYI only for provider: appointment scheduled on 1/29.  Patient was last seen in primary care on 06/04/2024 by Daryl Setter, NP.  Called Nurse Triage reporting Arm Pain.  Symptoms began x 1 year, pain worsened today.  Interventions attempted: Prescription medications: Gabapentin . .  Symptoms are: gradually worsening.  Triage Disposition: See PCP When Office is Open (Within 3 Days)  Patient/caregiver understands and will follow disposition?: Yes    Message from [facility] D sent at 10/09/2024  9:10 AM EST  Right arm pain constantly hurts worse when using it. Swelling sometimes. Pain in the muscle in her shoulder blade. Pain sometimes the pain goes up to her neck.   Reason for Disposition  [1] MODERATE pain (e.g., interferes with normal activities) AND [2] present > 3 days  Answer Assessment - Initial Assessment Questions 1. ONSET: When did the pain start?     Ongoing x 1 year, pain worsened this morning  2. LOCATION: Where is the pain located?     Right arm, above elbow   3. PAIN: How bad is the pain? (Scale 0-10; or none, mild, moderate, severe)     Moderate   4. WORK OR EXERCISE: Has there been any recent work or exercise that involved this part of the body?     No   5. CAUSE: What do you think is causing the arm pain?     Unsure   6. OTHER SYMPTOMS: Do you have any other symptoms? (e.g., neck pain, swelling, rash, fever, numbness, weakness)     Swelling in right arm- moderate.      Patient called in to triage with complaints of right arm pain.  This has been ongoing for one year 1, pain has worsened this morning.  The patient stated there is swelling at times, and the pain travels into shoulder blade.  For home care, the patient is taking Gabapentin .   Appointment scheduled for further evaluation; Patient agrees with the plan of care, and will reach out if symptoms worsen or persist.  Protocols used: Arm  Pain-A-AH

## 2024-10-09 NOTE — Telephone Encounter (Signed)
 Appt scheduled

## 2024-10-12 ENCOUNTER — Other Ambulatory Visit: Payer: Self-pay | Admitting: Family

## 2024-10-12 ENCOUNTER — Other Ambulatory Visit: Payer: Self-pay | Admitting: Physician Assistant

## 2024-10-13 ENCOUNTER — Ambulatory Visit

## 2024-10-14 ENCOUNTER — Ambulatory Visit: Payer: Self-pay | Admitting: Student

## 2024-10-16 ENCOUNTER — Ambulatory Visit (INDEPENDENT_AMBULATORY_CARE_PROVIDER_SITE_OTHER)

## 2024-10-16 VITALS — BP 130/90 | Ht 65.0 in | Wt 182.0 lb

## 2024-10-16 DIAGNOSIS — G8929 Other chronic pain: Secondary | ICD-10-CM | POA: Diagnosis not present

## 2024-10-16 DIAGNOSIS — M75121 Complete rotator cuff tear or rupture of right shoulder, not specified as traumatic: Secondary | ICD-10-CM | POA: Diagnosis not present

## 2024-10-16 DIAGNOSIS — M25511 Pain in right shoulder: Secondary | ICD-10-CM | POA: Diagnosis not present

## 2024-10-16 NOTE — Progress Notes (Signed)
" ° °  Subjective:    Patient ID: Jaime Harris, female    DOB: 69 y.o., Sep 25, 1955   MRN: [id number]  Chief Complaint: Right shoulder and arm pain  History of Present Illness Jaime Harris is a 69 year old female with myocardial infarction on Plavix  who presents with several months of right shoulder and arm pain.  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 - Radiates toward the neck and down the back - Characterized as burning and needle-like - Localized swelling at the right shoulder - Significant sleep disturbance, particularly when lying on the left side - No prior right shoulder surgery - No numbness or tingling down the arm - Swelling confined to the shoulder  Associated Musculoskeletal Findings: - Known nonpainful neck lipoma  Analgesic Use and Limitations: - Cannot use NSAIDs due to cardiac history and Plavix  therapy - Tylenol  Arthritis provides minimal relief - Currently taking tramadol  - Previous use of Voltaren gel  Imaging Findings: - Right shoulder radiographs (10/09/2024): mild glenohumeral joint space narrowing, minimal inferior glenoid spurring, mildly high-riding humerus, type I acromion, mottled appearance of the greater tuberosity  Review of Pertinent Imaging: 3 view plain film radiographs obtained of the right shoulder on 10/09/2024 from my independent evaluation revealing mild glenohumeral joint space narrowing with minimal spurring noted at the inferior portion of the glenoid.  Mottled appearance of the greater tuberosity.  Mildly high riding humerus.  Type I acromion.  No acute osseous abnormalities.    Objective:   Vitals:   10/16/24 0858  BP: (!) 130/90   Right shoulder ( compared to normal ) Inspection: - swelling, - scapular dyskinesis Palpation: TTP + greater tuberosity, + AC  joint, - biceps tendon, + posterior shoulder AROM/PROM: 80 forward flexion, 80 abduction, 35 external rotation, internal rotation to SI joint Strength: 5/5 lift off, 5/5 empty can, 5/5 external rotation, 5/5 flexion, + drop arm test Special tests:    -Rotator Cuff: + Neer's, + Hawkin's, + empty can, + painful arc   -Labrum: Unable to tolerate O'Brien's.   -Biceps: + speed's, - yergason's    -AC Joint: + cross arm testing     -Instability: Unable to tolerate external rotation/apprehension/relocation test     Assessment & Plan:   Assessment & Plan Right rotator cuff tear She experiences chronic right shoulder pain with significant functional limitation and swelling, consistent with at least a partial rotator cuff tear. Radiographs show mild glenohumeral joint space narrowing, minimal spurring, a mildly high riding humerus, and a mottled greater tuberosity, indicating rotator cuff pathology and possible superior migration of the humeral head. The differential includes a rotator cuff tear and a possible lipoma contributing to swelling. Non-surgical management is prioritized due to cardiac comorbidities and ongoing clopidogrel  therapy, which precludes NSAID use. An MRI of the right shoulder is ordered to evaluate rotator cuff integrity and injury extent. She is recommended to use topical diclofenac (Voltaren gel) up to four times daily for pain relief and scheduled acetaminophen  (Tylenol  Arthritis 650 mg) up to four times daily, spaced throughout the day. She should continue tramadol  as previously prescribed. Corticosteroid injection is deferred pending MRI findings. MRI results will be communicated, and follow-up will be arranged via telemedicine or phone.   "

## 2024-10-22 ENCOUNTER — Ambulatory Visit (HOSPITAL_BASED_OUTPATIENT_CLINIC_OR_DEPARTMENT_OTHER)

## 2024-10-27 ENCOUNTER — Ambulatory Visit

## 2024-12-02 ENCOUNTER — Ambulatory Visit: Admitting: Family
